# Patient Record
Sex: Female | Born: 1960 | Race: White | Hispanic: No | Smoking: Never smoker
Health system: Southern US, Community
[De-identification: ages and names within clinical notes are randomized; demographics above are authoritative.]

## PROBLEM LIST (undated history)

## (undated) DIAGNOSIS — IMO0002 Reserved for concepts with insufficient information to code with codable children: Secondary | ICD-10-CM

## (undated) DIAGNOSIS — F4310 Post-traumatic stress disorder, unspecified: Secondary | ICD-10-CM

## (undated) DIAGNOSIS — G43909 Migraine, unspecified, not intractable, without status migrainosus: Secondary | ICD-10-CM

## (undated) DIAGNOSIS — K635 Polyp of colon: Secondary | ICD-10-CM

## (undated) DIAGNOSIS — A498 Other bacterial infections of unspecified site: Secondary | ICD-10-CM

## (undated) DIAGNOSIS — Z5189 Encounter for other specified aftercare: Secondary | ICD-10-CM

## (undated) DIAGNOSIS — T7840XA Allergy, unspecified, initial encounter: Secondary | ICD-10-CM

## (undated) DIAGNOSIS — E785 Hyperlipidemia, unspecified: Secondary | ICD-10-CM

## (undated) DIAGNOSIS — M542 Cervicalgia: Secondary | ICD-10-CM

## (undated) DIAGNOSIS — K589 Irritable bowel syndrome without diarrhea: Secondary | ICD-10-CM

## (undated) DIAGNOSIS — H409 Unspecified glaucoma: Secondary | ICD-10-CM

## (undated) DIAGNOSIS — Z8711 Personal history of peptic ulcer disease: Secondary | ICD-10-CM

## (undated) DIAGNOSIS — D649 Anemia, unspecified: Secondary | ICD-10-CM

## (undated) DIAGNOSIS — G8929 Other chronic pain: Secondary | ICD-10-CM

## (undated) HISTORY — DX: Reserved for concepts with insufficient information to code with codable children: IMO0002

## (undated) HISTORY — DX: Post-traumatic stress disorder, unspecified: F43.10

## (undated) HISTORY — DX: Polyp of colon: K63.5

## (undated) HISTORY — DX: Encounter for other specified aftercare: Z51.89

## (undated) HISTORY — PX: OTHER SURGICAL HISTORY: SHX169

## (undated) HISTORY — DX: Anemia, unspecified: D64.9

## (undated) HISTORY — PX: TMJ ARTHROSCOPY: SHX1067

## (undated) HISTORY — DX: Other bacterial infections of unspecified site: A49.8

## (undated) HISTORY — DX: Migraine, unspecified, not intractable, without status migrainosus: G43.909

## (undated) HISTORY — PX: DG GALL BLADDER: HXRAD326

## (undated) HISTORY — DX: Unspecified glaucoma: H40.9

## (undated) HISTORY — DX: Allergy, unspecified, initial encounter: T78.40XA

## (undated) HISTORY — DX: Hyperlipidemia, unspecified: E78.5

## (undated) HISTORY — DX: Personal history of peptic ulcer disease: Z87.11

## (undated) HISTORY — DX: Cervicalgia: M54.2

## (undated) HISTORY — PX: OVARIAN CYST REMOVAL: SHX89

## (undated) HISTORY — DX: Irritable bowel syndrome without diarrhea: K58.9

## (undated) HISTORY — DX: Other chronic pain: G89.29

---

## 1998-03-26 ENCOUNTER — Other Ambulatory Visit: Admission: RE | Admit: 1998-03-26 | Discharge: 1998-03-26 | Payer: Self-pay | Admitting: Gynecology

## 1998-09-15 ENCOUNTER — Other Ambulatory Visit: Admission: RE | Admit: 1998-09-15 | Discharge: 1998-09-15 | Payer: Self-pay | Admitting: Gynecology

## 1998-12-08 ENCOUNTER — Ambulatory Visit (HOSPITAL_COMMUNITY): Admission: RE | Admit: 1998-12-08 | Discharge: 1998-12-08 | Payer: Self-pay | Admitting: Orthopaedic Surgery

## 1998-12-08 ENCOUNTER — Encounter: Payer: Self-pay | Admitting: Orthopaedic Surgery

## 1999-01-07 ENCOUNTER — Ambulatory Visit (HOSPITAL_BASED_OUTPATIENT_CLINIC_OR_DEPARTMENT_OTHER): Admission: RE | Admit: 1999-01-07 | Discharge: 1999-01-08 | Payer: Self-pay | Admitting: Orthopaedic Surgery

## 2002-07-18 ENCOUNTER — Other Ambulatory Visit: Admission: RE | Admit: 2002-07-18 | Discharge: 2002-07-18 | Payer: Self-pay | Admitting: Gynecology

## 2002-08-01 ENCOUNTER — Encounter: Admission: RE | Admit: 2002-08-01 | Discharge: 2002-08-01 | Payer: Self-pay | Admitting: Gynecology

## 2002-08-01 ENCOUNTER — Encounter: Payer: Self-pay | Admitting: Gynecology

## 2003-02-20 ENCOUNTER — Encounter: Admission: RE | Admit: 2003-02-20 | Discharge: 2003-02-20 | Payer: Self-pay | Admitting: Allergy and Immunology

## 2006-03-24 ENCOUNTER — Emergency Department (HOSPITAL_COMMUNITY): Admission: EM | Admit: 2006-03-24 | Discharge: 2006-03-24 | Payer: Self-pay | Admitting: Emergency Medicine

## 2007-05-18 ENCOUNTER — Other Ambulatory Visit: Admission: RE | Admit: 2007-05-18 | Discharge: 2007-05-18 | Payer: Self-pay | Admitting: Obstetrics and Gynecology

## 2007-06-14 ENCOUNTER — Encounter: Admission: RE | Admit: 2007-06-14 | Discharge: 2007-06-14 | Payer: Self-pay | Admitting: Obstetrics and Gynecology

## 2007-06-28 ENCOUNTER — Ambulatory Visit (HOSPITAL_COMMUNITY): Admission: RE | Admit: 2007-06-28 | Discharge: 2007-06-28 | Payer: Self-pay | Admitting: *Deleted

## 2007-06-28 ENCOUNTER — Encounter (INDEPENDENT_AMBULATORY_CARE_PROVIDER_SITE_OTHER): Payer: Self-pay | Admitting: *Deleted

## 2009-04-04 ENCOUNTER — Observation Stay (HOSPITAL_COMMUNITY): Admission: EM | Admit: 2009-04-04 | Discharge: 2009-04-09 | Payer: Self-pay | Admitting: Emergency Medicine

## 2009-04-07 ENCOUNTER — Encounter (INDEPENDENT_AMBULATORY_CARE_PROVIDER_SITE_OTHER): Payer: Self-pay | Admitting: Internal Medicine

## 2009-08-20 ENCOUNTER — Telehealth: Payer: Self-pay | Admitting: Infectious Disease

## 2009-08-26 ENCOUNTER — Ambulatory Visit: Payer: Self-pay | Admitting: Infectious Disease

## 2009-08-26 DIAGNOSIS — E7800 Pure hypercholesterolemia, unspecified: Secondary | ICD-10-CM

## 2009-08-26 DIAGNOSIS — A4901 Methicillin susceptible Staphylococcus aureus infection, unspecified site: Secondary | ICD-10-CM | POA: Insufficient documentation

## 2009-08-26 LAB — CONVERTED CEMR LAB
Calcium: 9.2 mg/dL (ref 8.4–10.5)
Creatinine, Ser: 0.76 mg/dL (ref 0.40–1.20)
Eosinophils Absolute: 0.2 10*3/uL (ref 0.0–0.7)
HCT: 41.8 % (ref 36.0–46.0)
Lymphs Abs: 3.6 10*3/uL (ref 0.7–4.0)
MCHC: 32.1 g/dL (ref 30.0–36.0)
MCV: 94.4 fL (ref 78.0–100.0)
Monocytes Absolute: 0.6 10*3/uL (ref 0.1–1.0)
RBC: 4.43 M/uL (ref 3.87–5.11)
RDW: 13.6 % (ref 11.5–15.5)
Sodium: 142 meq/L (ref 135–145)
WBC: 8.1 10*3/uL (ref 4.0–10.5)

## 2009-09-30 ENCOUNTER — Ambulatory Visit: Payer: Self-pay | Admitting: Infectious Disease

## 2010-01-03 HISTORY — PX: CHOLECYSTECTOMY: SHX55

## 2010-01-24 ENCOUNTER — Encounter: Payer: Self-pay | Admitting: Obstetrics and Gynecology

## 2010-02-04 NOTE — Assessment & Plan Note (Signed)
Summary: NEW PATIENT WITH SKIN RASH PER KVD   Visit Type:  Consult Referring Nura Cahoon:  Dr. Sherald Hess Primary Crystelle Ferrufino:  none  CC:  new patient with rash, staph infection on nose, breast, and and chest.  History of Present Illness: 50 yo noticed swelling on nose and drained pus, went to urgent care [facility], sp I and D and found to have a "staph infection" Was given big green pill made nauseous, then given septra then  then returned in early August then given septra and then developed rash. She has had a lesion show up on her lower abdomen as well. She is PCN allergci--> hives, and now septra allergic. She has hyperlipidemia but no othe rmedical problems. I spent an  thour with this pt including >50% of time with face to face counsellng.  Problems Prior to Update: None  Medications Prior to Update: 1)  None  Current Medications (verified): 1)  Claritin 10 Mg Caps (Loratadine) .Marland Kitchen.. 1 Once Daily 2)  Mupirocin 2 % Oint (Mupirocin) .... Apply Inside Both Nostrils Twice Daily For 7 Days 3)  Hibiclens 4 % Liqd (Chlorhexidine Gluconate) .... Wash Once Daily With Hibiclens For An Hour. Do Not Apply Makeup, Deodorant For 1 Hr After Washing Disp One Bottle  Allergies (verified): 1)  ! Pcn 2)  ! Sulfa 3)  ! Codeine   Preventive Screening-Counseling & Management  Alcohol-Tobacco     Smoking Status: never   Current Allergies (reviewed today): ! PCN ! SULFA ! 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  The patient denies anorexia, fever, weight loss, weight gain, vision loss, decreased hearing, hoarseness, chest pain, syncope, dyspnea on exertion, peripheral edema, prolonged cough, headaches, abdominal pain, melena,  hematochezia, severe indigestion/heartburn, hematuria, incontinence, genital sores, muscle weakness, transient blindness, difficulty walking, depression, unusual weight change, abnormal bleeding, and enlarged lymph nodes.    Vital Signs:  Patient profile:   50 year old female Height:      68 inches (172.72 cm) Weight:      199.75 pounds (90.80 kg) BMI:     30.48 Temp:     98.0 degrees F (36.67 degrees C) oral Pulse rate:   99 / minute BP sitting:   122 / 78  (right arm)  Vitals Entered By: Starleen Arms CMA (August 26, 2009 8:56 AM) CC: new patient with rash, staph infection on nose, breast, and chest Is Patient Diabetic? No Pain Assessment Patient in pain? no      Nutritional Status BMI of > 30 = obese  Does patient need assistance? Functional Status Self care Ambulation Normal   Physical Exam  General:  alert, well-developed, well-nourished, and well-hydrated.   Head:  normocephalic, atraumatic, and no abnormalities observed.   Eyes:  vision grossly intact, pupils equal, pupils round, and pupils reactive to light.   Ears:  no external deformities.  R ear full of wax, left membrane pearly gray Nose:  slight fading erythema on nose, no external deformity and no nasal discharge.   Mouth:  pharynx pink and moist and no erythema.   Neck:  supple and full ROM.   Lungs:  normal respiratory effort, no crackles, and no wheezes.   Heart:  normal rate, regular rhythm, no murmur, and no gallop.  Abdomen:  soft, non-tender, normal bowel sounds, and no distention.   Msk:  normal ROM and no joint swelling.   Extremities:  1+ left pedal edema and 1+ right pedal edema.   Neurologic:  alert & oriented X3, strength normal in all extremities, and gait normal.   Skin:  she has slight faint erythema hwere she had infection over her nose she has healing prior furuncle on lower abdominal wall Psych:  Oriented X3, memory intact for recent and remote, and good eye contact.     Impression &  Recommendations:  Problem # 1:  METHICILLIN SUSCEPTIBLE STAPH INF CCE & UNS SITE (ICD-041.11) Because we do not have records from her urgent care yet I do not know for sur eif this is MSSA or MRSA. Base don what she is saying sounds like MSSA. We will try decontamination regimen f for her with mupirocin and hibiclenz. SHe can see if Mom and sister want to do this in concert. If she has recurrence drainage  is key and if we need antibiotics could consider doxycycline (though wonder if that made her stomach upset) possibly clinda (though dont have sensis) and if it is MSSA and she tolearted keflex or ceph would actually use this. Will check her for HIV for thoroughness sake Orders: T-Basic Metabolic Panel [id number] T-CBC w/Diff [id number]  T-HIV Antibody  (Reflex) [id number]) T-Culture, Wound ([id number])  Problem # 2:  HYPERCHOLESTEROLEMIA (ICD-272.0)  on meds  Orders: Consultation Level V (29528)  Medications Added to Medication List This Visit: 1)  Claritin 10 Mg Caps (Loratadine) .Marland Kitchen.. 1 once daily 2)  Mupirocin 2 % Oint (Mupirocin) .... Apply inside both nostrils twice daily for 7 days 3)  Hibiclens 4 % Liqd (Chlorhexidine gluconate) .... Wash once daily with hibiclens for an hour. do not apply makeup, deodorant for 1 hr after washing disp one bottle  Other Orders: T-Basic Metabolic Panel [id number]) Flu Vaccine 50yrs + [phone]) Admin 1st Vaccine ([id number])  Patient Instructions: 1)  apply mupirocin intranasally and hibiclenz topically 2)  YOu should talk to your Mom and sister about their doing this in synchrony 3)  rtc to see Dr. Daiva Eves in one month Prescriptions: HIBICLENS 4 % LIQD (CHLORHEXIDINE GLUCONATE) wash once daily with hibiclens for an hour. do not apply makeup, deodorant for 1 hr after washing disp one bottle  #1 x 2   Entered and Authorized by:   Acey Lav MD   Signed by:   Paulette Blanch Dam MD on 08/26/2009   Method used:   Electronically to          [facility]  [phone]* (retail)       [address] [place], [place]  [address]       Ph: [phone] or [phone]       Fax: [phone]   RxID:   [id number] MUPIROCIN 2 % OINT (MUPIROCIN) apply inside both nostrils twice daily for 7 days  #30 x 1   Entered and Authorized by:   Acey Lav MD   Signed by:   Acey Lav MD on 08/26/2009   Method used:   Electronically to        [facility]  [phone]* (retail)       [address] [place], [place]  [address]       Ph: [phone] or [phone]       Fax: [phone]   RxID:  [id number]    Immunizations Administered:  Influenza Vaccine # 1:    Vaccine Type: Fluvax 3+    Site: right deltoid    Dose: 0.5 ml    Route: IM    Given by: Starleen Arms CMA    Exp. Date: 03/2010    Lot #: [id number]    VIS given: 07/27/06 version given August 26, 2009.  Flu Vaccine Consent Questions:    Do you have a history of severe allergic reactions to this vaccine? no    Any prior history of allergic reactions to egg and/or gelatin? no    Do you have a sensitivity to the preservative Thimersol? no    Do you have a past history of Guillan-Barre Syndrome? no    Do you currently have an acute febrile illness? no    Have you ever had a severe reaction to latex? no    Vaccine information given and explained to patient? yes    Are you currently pregnant? no

## 2010-02-04 NOTE — Assessment & Plan Note (Signed)
Summary: 49month f/u/per dr Zenaida Niece dam/vs   Visit Type:  Follow-up Referring Provider:  Dr. Sherald Hess Primary Provider:  Juleen China  CC:  f/u.  History of Present Illness: 50 yo noticed swelling on nose and drained pus, went to urgent care [facility], sp I and D and found to have a "staph infection" Was given big green pill made nauseous, then given septra then  then returned in early August then given septra and then developed rash. She has had a lesion show up on her lower abdomen as well. She is PCN allergci--> hives, and now septra allergic. She has hyperlipidemia but no othe rmedical problems. I swabbed her nares when I saw her as consult and did not recover MRSA, MSSA or strep from culture. She did decontamination regimen with hibiclenz and mupirocin and had questionable recurrence of lesion on her right forearm. Her nose still has scar from prior infection. She inquired for primary care MD in [place] area for her to follow when Dr. Larae Grooms   Current Allergies (reviewed today): ! PCN ! SULFA ! 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  noncontributory  Social History: Reviewed history from 08/26/2009 and no changes required. 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    Vital Signs:  Patient profile:   50 year old female Menstrual status:  postmenopausal Height:      68 inches (172.72 cm) Weight:      195 pounds (88.64 kg) BMI:     29.76 Temp:     97 degrees F (36.11 degrees C) oral Pulse rate:   97 / minute BP sitting:   141 / 90  (left arm)  Vitals Entered By: Starleen Arms CMA (September 30, 2009 8:45 AM) CC: f/u Is Patient Diabetic? No Pain Assessment Patient in pain? no      Nutritional Status BMI of 25 - 29 = overweight Nutritional Status Detail nl  Does patient need assistance? Functional Status Self care Ambulation Normal     Menstrual Status postmenopausal   Physical Exam  General:  alert, well-developed, well-nourished, and well-hydrated.   Head:  normocephalic, atraumatic, and no abnormalities observed.   Eyes:  vision grossly intact, pupils equal, pupils round, and pupils reactive to light.   Ears:  no external deformities.  R ear full of wax, left membrane pearly gray Nose:  very faint  erythema on nose, no external deformity and no nasal discharge.   Mouth:  pharynx pink and moist and no erythema.   Lungs:  normal respiratory effort, no crackles, and no wheezes.   Heart:  normal rate, regular rhythm, no murmur, and no gallop.   Abdomen:  soft, non-tender, normal bowel sounds, and no distention.   Msk:  normal ROM and no joint swelling.  Neurologic:  alert & oriented X3, strength normal in all extremities, and gait normal.   Skin:  no new furuncles Psych:  Oriented X3, memory intact for recent and remote, and good eye contact.     Impression & Recommendations:  Problem # 1:  METHICILLIN SUSCEPTIBLE STAPH INF CCE & UNS SITE  (ICD-041.11)  Does not appear to have any significant recurrence  Orders: Est. Patient Level IV (65784)  Problem # 2:  HYPERCHOLESTEROLEMIA (ICD-272.0)  Orders: Est. Patient Level IV (69629)  Patient Instructions: 1)  Rtc as needed to see Dr. Daiva Eves

## 2010-02-04 NOTE — Progress Notes (Signed)
Summary: Rash  Phone Note Call from Patient   Caller: Patient Summary of Call: Pt. called requesting a sooner appt. for rash covering most of her body.  Pt. has appt. with Dr. Algis Liming for 08/26/09.  Offered Tuesday 08/25/09 first available opening. Pt. said needed to be seen sooner. Suggested urgent care or primary care physician. She said she did not have a primary and may go to ER. Initial call taken by: Wendall Mola CMA Duncan Dull),  August 20, 2009 10:12 AM  Follow-up for Phone Call       Follow-up by: Paulette Blanch Dam MD,  August 20, 2009 10:29 AM

## 2010-03-23 ENCOUNTER — Encounter: Payer: Self-pay | Admitting: *Deleted

## 2010-03-24 LAB — COMPREHENSIVE METABOLIC PANEL
ALT: 117 U/L — ABNORMAL HIGH (ref 0–35)
ALT: 76 U/L — ABNORMAL HIGH (ref 0–35)
ALT: 84 U/L — ABNORMAL HIGH (ref 0–35)
AST: 104 U/L — ABNORMAL HIGH (ref 0–37)
AST: 40 U/L — ABNORMAL HIGH (ref 0–37)
AST: 52 U/L — ABNORMAL HIGH (ref 0–37)
AST: 61 U/L — ABNORMAL HIGH (ref 0–37)
Albumin: 2.7 g/dL — ABNORMAL LOW (ref 3.5–5.2)
Albumin: 3.2 g/dL — ABNORMAL LOW (ref 3.5–5.2)
Alkaline Phosphatase: 121 U/L — ABNORMAL HIGH (ref 39–117)
Alkaline Phosphatase: 142 U/L — ABNORMAL HIGH (ref 39–117)
Alkaline Phosphatase: 148 U/L — ABNORMAL HIGH (ref 39–117)
Alkaline Phosphatase: 176 U/L — ABNORMAL HIGH (ref 39–117)
Alkaline Phosphatase: 185 U/L — ABNORMAL HIGH (ref 39–117)
BUN: 7 mg/dL (ref 6–23)
CO2: 24 mEq/L (ref 19–32)
CO2: 26 mEq/L (ref 19–32)
CO2: 29 mEq/L (ref 19–32)
Calcium: 8.3 mg/dL — ABNORMAL LOW (ref 8.4–10.5)
Calcium: 8.6 mg/dL (ref 8.4–10.5)
Calcium: 8.6 mg/dL (ref 8.4–10.5)
Calcium: 8.7 mg/dL (ref 8.4–10.5)
Chloride: 105 mEq/L (ref 96–112)
Chloride: 108 mEq/L (ref 96–112)
Creatinine, Ser: 0.62 mg/dL (ref 0.4–1.2)
Creatinine, Ser: 0.78 mg/dL (ref 0.4–1.2)
GFR calc Af Amer: >60 mL/min (ref >60)
GFR calc Af Amer: >60 mL/min (ref >60)
GFR calc Af Amer: >60 mL/min (ref >60)
GFR calc non Af Amer: >60 mL/min (ref >60)
GFR calc non Af Amer: >60 mL/min (ref >60)
Glucose, Bld: 112 mg/dL — ABNORMAL HIGH (ref 70–99)
Glucose, Bld: 143 mg/dL — ABNORMAL HIGH (ref 70–99)
Glucose, Bld: 92 mg/dL (ref 70–99)
Glucose, Bld: 95 mg/dL (ref 70–99)
Potassium: 3.6 mEq/L (ref 3.5–5.1)
Potassium: 3.7 mEq/L (ref 3.5–5.1)
Sodium: 139 mEq/L (ref 135–145)
Sodium: 140 mEq/L (ref 135–145)
Sodium: 140 mEq/L (ref 135–145)
Total Bilirubin: 0.7 mg/dL (ref 0.3–1.2)
Total Bilirubin: 0.9 mg/dL (ref 0.3–1.2)
Total Protein: 5.6 g/dL — ABNORMAL LOW (ref 6.0–8.3)
Total Protein: 5.8 g/dL — ABNORMAL LOW (ref 6.0–8.3)
Total Protein: 6.3 g/dL (ref 6.0–8.3)
Total Protein: 6.5 g/dL (ref 6.0–8.3)

## 2010-03-24 LAB — GLUCOSE, CAPILLARY
Glucose-Capillary: 104 mg/dL — ABNORMAL HIGH (ref 70–99)
Glucose-Capillary: 110 mg/dL — ABNORMAL HIGH (ref 70–99)
Glucose-Capillary: 116 mg/dL — ABNORMAL HIGH (ref 70–99)
Glucose-Capillary: 161 mg/dL — ABNORMAL HIGH (ref 70–99)
Glucose-Capillary: 162 mg/dL — ABNORMAL HIGH (ref 70–99)
Glucose-Capillary: 84 mg/dL (ref 70–99)

## 2010-03-24 LAB — PHOSPHORUS: Phosphorus: 3 mg/dL (ref 2.3–4.6)

## 2010-03-24 LAB — URINALYSIS, ROUTINE W REFLEX MICROSCOPIC
Glucose, UA: NEGATIVE mg/dL
Ketones, ur: NEGATIVE mg/dL
Urobilinogen, UA: 0.2 mg/dL (ref 0.0–1.0)

## 2010-03-24 LAB — DIFFERENTIAL
Basophils Relative: 1 % (ref 0–1)
Eosinophils Absolute: 0 10*3/uL (ref 0.0–0.7)
Lymphs Abs: 1.1 10*3/uL (ref 0.7–4.0)
Lymphs Abs: 1.9 10*3/uL (ref 0.7–4.0)
Monocytes Absolute: 0.5 10*3/uL (ref 0.1–1.0)
Monocytes Relative: 2 % — ABNORMAL LOW (ref 3–12)
Monocytes Relative: 7 % (ref 3–12)
Neutro Abs: 5.6 10*3/uL (ref 1.7–7.7)
Neutro Abs: 6.3 10*3/uL (ref 1.7–7.7)

## 2010-03-24 LAB — CBC
HCT: 36.4 % (ref 36.0–46.0)
HCT: 40.9 % (ref 36.0–46.0)
Hemoglobin: 11 g/dL — ABNORMAL LOW (ref 12.0–15.0)
Hemoglobin: 11.2 g/dL — ABNORMAL LOW (ref 12.0–15.0)
Hemoglobin: 12.9 g/dL (ref 12.0–15.0)
Hemoglobin: 12.9 g/dL (ref 12.0–15.0)
Hemoglobin: 14.2 g/dL (ref 12.0–15.0)
MCHC: 34.7 g/dL (ref 30.0–36.0)
MCHC: 34.8 g/dL (ref 30.0–36.0)
MCHC: 35.4 g/dL (ref 30.0–36.0)
MCV: 89.6 fL (ref 78.0–100.0)
MCV: 90 fL (ref 78.0–100.0)
MCV: 90.1 fL (ref 78.0–100.0)
Platelets: 161 10*3/uL (ref 150–400)
Platelets: 171 10*3/uL (ref 150–400)
Platelets: 192 10*3/uL (ref 150–400)
RBC: 3.54 MIL/uL — ABNORMAL LOW (ref 3.87–5.11)
RBC: 3.81 MIL/uL — ABNORMAL LOW (ref 3.87–5.11)
RBC: 4.12 MIL/uL (ref 3.87–5.11)
RBC: 4.53 MIL/uL (ref 3.87–5.11)
RDW: 12.3 % (ref 11.5–15.5)
RDW: 12.6 % (ref 11.5–15.5)
RDW: 12.7 % (ref 11.5–15.5)
WBC: 8.2 10*3/uL (ref 4.0–10.5)

## 2010-03-24 LAB — CARDIAC PANEL(CRET KIN+CKTOT+MB+TROPI)
CK, MB: 0.5 ng/mL (ref 0.3–4.0)
CK, MB: 0.6 ng/mL (ref 0.3–4.0)
Relative Index: INVALID (ref 0.0–2.5)
Total CK: 76 U/L (ref 7–177)

## 2010-03-24 LAB — POCT CARDIAC MARKERS
CKMB, poc: <1 ng/mL — ABNORMAL LOW (ref 1.0–8.0)
Myoglobin, poc: 36 ng/mL (ref 12–200)
Troponin i, poc: <0.05 ng/mL (ref 0.00–0.09)

## 2010-03-24 LAB — RAPID URINE DRUG SCREEN, HOSP PERFORMED
Amphetamines: NOT DETECTED
Tetrahydrocannabinol: NOT DETECTED

## 2010-03-24 LAB — HEPATIC FUNCTION PANEL
AST: 77 U/L — ABNORMAL HIGH (ref 0–37)
Albumin: 3.1 g/dL — ABNORMAL LOW (ref 3.5–5.2)
Total Bilirubin: 0.8 mg/dL (ref 0.3–1.2)

## 2010-03-24 LAB — T4, FREE: Free T4: 1.05 ng/dL (ref 0.80–1.80)

## 2010-03-24 LAB — LIPID PANEL
Cholesterol: 264 mg/dL — ABNORMAL HIGH (ref 0–200)
HDL: 48 mg/dL (ref >39)
Total CHOL/HDL Ratio: 5.5 RATIO
Triglycerides: 129 mg/dL (ref <150)

## 2010-03-24 LAB — TSH: TSH: 0.349 u[IU]/mL — ABNORMAL LOW (ref 0.350–4.500)

## 2010-03-24 LAB — URINE MICROSCOPIC-ADD ON

## 2010-03-24 LAB — HEMOGLOBIN A1C
Hgb A1c MFr Bld: 5.5 % (ref 4.6–6.1)
Mean Plasma Glucose: 111 mg/dL

## 2010-03-24 LAB — URINE CULTURE
Colony Count: NO GROWTH
Culture: NO GROWTH

## 2010-03-24 LAB — CK TOTAL AND CKMB (NOT AT ARMC)
CK, MB: 0.7 ng/mL (ref 0.3–4.0)
Relative Index: INVALID (ref 0.0–2.5)
Total CK: 97 U/L (ref 7–177)

## 2010-03-24 LAB — TROPONIN I: Troponin I: <0.01 ng/mL (ref 0.00–0.06)

## 2010-03-24 LAB — LIPASE, BLOOD: Lipase: 23 U/L (ref 11–59)

## 2010-05-18 NOTE — Op Note (Signed)
Grace Taylor, Grace Taylor NO.:  [id number]   MEDICAL RECORD NO.:  [id number]          PATIENT TYPE:  AMB   LOCATION:  ENDO                         FACILITY:  [facility]   PHYSICIAN:  Georgiana Spinner, M.D.    DATE OF BIRTH:  July 29, 1960   DATE OF PROCEDURE:  DATE OF DISCHARGE:                               OPERATIVE REPORT   PROCEDURE:  Colonoscopy.   INDICATIONS:  Rectal bleeding.   ANESTHESIA:  Fentanyl 25 mcg, Versed 2.5 mg.   PROCEDURE:  With the patient mildly sedated in the left lateral  decubitus position the Pentax videoscopic colonoscope was inserted in  the rectum and passed under direct vision with pressure applied to reach  the cecum identified by base of cecum and ileocecal valve both of which  were photographed.  From this point the colonoscope was slowly withdrawn  taking circumferential views of colonic mucosa, stopping in the rectum  which appeared normal on direct and showed hemorrhoids on retroflexed  view  The endoscope was straightened and withdrawn.  The patient's vital  signs, pulse oximeter remained stable.  The patient tolerated procedure  well without apparent complications.   FINDINGS:  Internal hemorrhoids otherwise unremarkable exam.   PLAN:  Consider repeat examination 5-10 years if indicated.           ______________________________  Georgiana Spinner, M.D.     GMO/MEDQ  D:  06/28/2007  T:  06/28/2007  Job:  [id number]

## 2010-05-18 NOTE — Op Note (Signed)
NAMEPERSIA, LINTNER NO.:  [id number]   MEDICAL RECORD NO.:  [id number]          PATIENT TYPE:  AMB   LOCATION:  ENDO                         FACILITY:  [facility]   PHYSICIAN:  Georgiana Spinner, M.D.    DATE OF BIRTH:  22-Oct-1960   DATE OF PROCEDURE:  06/28/2007  DATE OF DISCHARGE:                               OPERATIVE REPORT   PROCEDURE:  Upper endoscopy.   INDICATIONS:  Hemoccult positivity.   ANESTHESIA:  Fentanyl 75 mcg, Versed 7.5 mg.   PROCEDURE:  With the patient mildly sedated in the left lateral  decubitus position, the Pentax videoscopic endoscope was inserted in the  mouth, passed under direct vision through the esophagus which appeared  normal until we reached distal esophagus and it would not open fully but  there is a questionable tongue of Barrett's which was photographed and  biopsy.  We entered into the stomach.  Fundus, body, antrum, duodenal  bulb, second portion of duodenum appeared normal.  From this point, the  endoscope was slowly withdrawn taking circumferential views of duodenal  mucosa until the endoscope had been pulled back into stomach, placed in  retroflexion to view the stomach from below.  The endoscope was  straightened and withdrawn taking circumferential views of the remaining  gastric and esophageal mucosa.  The patient's vital signs and pulse  oximeter remained stable.  The patient tolerated procedure well without  apparent complications.   FINDINGS:  Question of Barrett's esophagus, otherwise unremarkable exam.  Await biopsy report.  The patient will call me for results and follow-up  with me as an outpatient.  Proceed to colonoscopy.           ______________________________  Georgiana Spinner, M.D.     GMO/MEDQ  D:  06/28/2007  T:  06/28/2007  Job:  [id number]

## 2010-05-21 NOTE — Op Note (Signed)
[place]  Patient:    Grace Taylor                 MRN: [id number] Proc. Date: 01/07/99 Adm. Date:  [id number] Attending:  Marcene Corning                           Operative Report  PREOPERATIVE DIAGNOSIS: 1. Right shoulder impingement. 2. Right shoulder rotator cuff tear.  POSTOPERATIVE DIAGNOSIS: 1. Right shoulder impingement. 2. Right shoulder rotator cuff tear.  PROCEDURE: 1. Right shoulder arthroscopic acromioplasty. 2. Right shoulder open rotator cuff repair.  ANESTHESIA:  General.  ATTENDING SURGEON:  Lubertha Basque. Jerl Santos, M.D.  ASSISTANT:  Buren Kos, P.A.  INDICATIONS FOR PROCEDURE:  The patient is a woman with several months of right  dominant shoulder pain.  This started about six or seven months ago and she has  received transient relief after subacromial injections.  She eventually underwent a MRI scan, which showed a complete rotator cuff tear along with subacromial impingement.  The plan is to proceed at this point with arthroscopy with possible open versus arthroscopic rotator cuff repair.  The procedures were discussed with the patient.  An informed operative consent was obtained after discussion of the possible complications of: reaction to anesthesia and infection.  DESCRIPTION OF PROCEDURE:  The patient was taken to the operating suite, where general anesthetic was applied without difficulty.  The patient was placed in the beach chair position; prepped and draped in the normal sterile fashion.  After he administration of preoperative IV antibiotics, an arthroscopy of the right shoulder was performed through two portals.  Glenohumeral joint showed no degenerative change.  Biceps tendon was well anchored.  The labrale structures, LAD, RAL were all intact.  The rotator cuff was torn when inspected from below.  When inspected from above she also had a rotator cuff tear, which was complete.  The  tear was about 1.5 cm in length, but had a longitudinal split as well.  This was felt difficult to repair through the scope.  An arthroscopic acromioplasty was performed to flatten the acromion, followed by extension of her lateral portal into an incision.  The deltoid was split and the rotator cuff examined.  A portion of the greater tuberosity was removed.  A bur was used to freshen a bleeding bed of bone in the area of her intended repair.  A Bios corkscrew anchor was placed with two #2 ethibonds emanating.  These were then passed through the cuff, and the cuff was sewn down on this surface of bleeding  bone without difficulty.  No great tension was placed on the repair with the arm at the side.  The wound was irrigated, followed by closure of the deltoid fascia with 2.0 undyed Vicryl.  Subcutaneous tissue was reapproximated with a similar stitch, followed by subcuticular closure and Steri-Strips.  The wound was injected with  Marcaine, followed by Adaptic and a dry gauze dressing with tape.  ESTIMATED BLOOD LOSS/INTRAOPERATIVE FLUIDS:  Obtained from the anesthesia records.  DISPOSITION:  The patient was extubated in the operating room, taken to the recovery room in stable condition.  PLANS:  Patient is to stay overnight for pain control, with probable discharge n the morning. DD:  01/07/99 TD:  01/08/99 Job: [id number]

## 2011-02-01 ENCOUNTER — Emergency Department (HOSPITAL_COMMUNITY)
Admission: EM | Admit: 2011-02-01 | Discharge: 2011-02-01 | Disposition: A | Discharge status: Home / Self Care | Payer: No Typology Code available for payment source | Attending: Emergency Medicine | Admitting: Emergency Medicine

## 2011-02-01 ENCOUNTER — Emergency Department (HOSPITAL_COMMUNITY): Payer: No Typology Code available for payment source

## 2011-02-01 ENCOUNTER — Encounter (HOSPITAL_COMMUNITY): Payer: Self-pay | Admitting: *Deleted

## 2011-02-01 DIAGNOSIS — T1490XA Injury, unspecified, initial encounter: Secondary | ICD-10-CM | POA: Insufficient documentation

## 2011-02-01 DIAGNOSIS — M542 Cervicalgia: Secondary | ICD-10-CM | POA: Insufficient documentation

## 2011-02-01 DIAGNOSIS — M25569 Pain in unspecified knee: Secondary | ICD-10-CM | POA: Insufficient documentation

## 2011-02-01 DIAGNOSIS — M62838 Other muscle spasm: Secondary | ICD-10-CM | POA: Insufficient documentation

## 2011-02-01 MED ORDER — CYCLOBENZAPRINE HCL 10 MG PO TABS: 5.0000 mg | ORAL_TABLET | Freq: Two times a day (BID) | ORAL | Status: AC | PRN

## 2011-02-01 MED ORDER — IBUPROFEN 800 MG PO TABS: 800.0000 mg | ORAL_TABLET | Freq: Three times a day (TID) | ORAL | Status: DC

## 2011-02-01 NOTE — ED Notes (Signed)
Patient transported to X-ray 
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
## 2011-02-01 NOTE — ED Notes (Signed)
Pt was explain d/c papers. Pt denies having any questions. Pt states she may go and see another md

## 2011-02-02 NOTE — ED Provider Notes (Signed)
Medical screening examination/treatment/procedure(s) were performed by non-physician practitioner and as supervising physician I was immediately available for consultation/collaboration.  Donnetta Hutching, MD 02/02/11 1610

## 2012-01-04 HISTORY — PX: COLONOSCOPY: SHX174

## 2012-01-04 HISTORY — PX: ESOPHAGOGASTRODUODENOSCOPY: SHX1529

## 2013-01-15 ENCOUNTER — Emergency Department (HOSPITAL_BASED_OUTPATIENT_CLINIC_OR_DEPARTMENT_OTHER)
Admission: EM | Admit: 2013-01-15 | Discharge: 2013-01-15 | Disposition: A | Discharge status: Home / Self Care | Attending: Emergency Medicine | Admitting: Emergency Medicine

## 2013-01-15 ENCOUNTER — Emergency Department (HOSPITAL_BASED_OUTPATIENT_CLINIC_OR_DEPARTMENT_OTHER)

## 2013-01-15 ENCOUNTER — Encounter (HOSPITAL_BASED_OUTPATIENT_CLINIC_OR_DEPARTMENT_OTHER): Payer: Self-pay | Admitting: Emergency Medicine

## 2013-01-15 DIAGNOSIS — S4980XA Other specified injuries of shoulder and upper arm, unspecified arm, initial encounter: Secondary | ICD-10-CM | POA: Diagnosis present

## 2013-01-15 DIAGNOSIS — Y9389 Activity, other specified: Secondary | ICD-10-CM | POA: Insufficient documentation

## 2013-01-15 DIAGNOSIS — IMO0002 Reserved for concepts with insufficient information to code with codable children: Secondary | ICD-10-CM | POA: Diagnosis not present

## 2013-01-15 DIAGNOSIS — S5000XA Contusion of unspecified elbow, initial encounter: Secondary | ICD-10-CM | POA: Insufficient documentation

## 2013-01-15 DIAGNOSIS — S7001XA Contusion of right hip, initial encounter: Secondary | ICD-10-CM

## 2013-01-15 DIAGNOSIS — Z880 Allergy status to penicillin: Secondary | ICD-10-CM | POA: Insufficient documentation

## 2013-01-15 DIAGNOSIS — S5010XA Contusion of unspecified forearm, initial encounter: Secondary | ICD-10-CM | POA: Diagnosis not present

## 2013-01-15 DIAGNOSIS — S7000XA Contusion of unspecified hip, initial encounter: Secondary | ICD-10-CM | POA: Insufficient documentation

## 2013-01-15 DIAGNOSIS — Z79899 Other long term (current) drug therapy: Secondary | ICD-10-CM | POA: Diagnosis not present

## 2013-01-15 DIAGNOSIS — S46909A Unspecified injury of unspecified muscle, fascia and tendon at shoulder and upper arm level, unspecified arm, initial encounter: Secondary | ICD-10-CM | POA: Diagnosis present

## 2013-01-15 DIAGNOSIS — S5011XA Contusion of right forearm, initial encounter: Secondary | ICD-10-CM

## 2013-01-15 DIAGNOSIS — Y9269 Other specified industrial and construction area as the place of occurrence of the external cause: Secondary | ICD-10-CM | POA: Diagnosis not present

## 2013-01-15 MED ORDER — TRAMADOL HCL 50 MG PO TABS: 50.0000 mg | ORAL_TABLET | Freq: Four times a day (QID) | ORAL | Status: DC | PRN

## 2013-01-15 MED ORDER — IBUPROFEN 800 MG PO TABS: 800.0000 mg | ORAL_TABLET | Freq: Three times a day (TID) | ORAL | Status: DC

## 2013-01-15 NOTE — ED Provider Notes (Signed)
Medical screening examination/treatment/procedure(s) were performed by non-physician practitioner and as supervising physician I was immediately available for consultation/collaboration.    Kathalene Frames, MD 01/15/13 [phone]

## 2013-01-15 NOTE — ED Notes (Signed)
Patient transported to X-ray 
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
## 2013-01-15 NOTE — Discharge Instructions (Signed)

## 2013-01-17 ENCOUNTER — Ambulatory Visit (INDEPENDENT_AMBULATORY_CARE_PROVIDER_SITE_OTHER): Admitting: Sports Medicine

## 2013-01-17 ENCOUNTER — Encounter: Payer: Self-pay | Admitting: Sports Medicine

## 2013-01-17 ENCOUNTER — Ambulatory Visit (INDEPENDENT_AMBULATORY_CARE_PROVIDER_SITE_OTHER)

## 2013-01-17 VITALS — BP 154/84 | HR 95 | Ht 67.0 in | Wt 220.0 lb

## 2013-01-17 DIAGNOSIS — S7000XA Contusion of unspecified hip, initial encounter: Secondary | ICD-10-CM

## 2013-01-17 DIAGNOSIS — S7001XA Contusion of right hip, initial encounter: Secondary | ICD-10-CM

## 2013-01-17 DIAGNOSIS — R079 Chest pain, unspecified: Secondary | ICD-10-CM

## 2013-01-17 DIAGNOSIS — R0781 Pleurodynia: Secondary | ICD-10-CM

## 2013-01-17 DIAGNOSIS — S40011A Contusion of right shoulder, initial encounter: Secondary | ICD-10-CM | POA: Insufficient documentation

## 2013-01-17 DIAGNOSIS — S40019A Contusion of unspecified shoulder, initial encounter: Secondary | ICD-10-CM

## 2013-01-17 DIAGNOSIS — R0789 Other chest pain: Secondary | ICD-10-CM | POA: Insufficient documentation

## 2013-01-17 NOTE — Assessment & Plan Note (Signed)
Good function. X-rays to complete the workup. Continue ibuprofen and occasional tramadol. No use of right arm for 2 weeks, return to see me in 2 weeks for full clearance.
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
## 2013-01-17 NOTE — Assessment & Plan Note (Signed)
Pain is localized over the ribs at the mid axillary line. X-rays. Ibuprofen as needed. Return in 2 weeks.

## 2013-01-17 NOTE — Assessment & Plan Note (Signed)
Pain over the right posterior hip as well as greater trochanter. X-rays to complete the workup. Continue analgesics as needed. Return to see me in 2 weeks, I will likely clear her afterwards.

## 2013-01-28 ENCOUNTER — Telehealth: Payer: Self-pay

## 2013-01-28 NOTE — Telephone Encounter (Signed)
Error

## 2013-01-28 NOTE — Telephone Encounter (Signed)
Patient called stated that we sent her back to work and she needed  pain medication, she was told that she would have to schedule an appt for 01/30/2013 to be released to go back to work. Gerardo Caiazzo,CMA

## 2013-01-31 ENCOUNTER — Ambulatory Visit (INDEPENDENT_AMBULATORY_CARE_PROVIDER_SITE_OTHER): Admitting: Sports Medicine

## 2013-01-31 ENCOUNTER — Encounter: Payer: Self-pay | Admitting: Sports Medicine

## 2013-01-31 VITALS — BP 188/104 | HR 88 | Wt 219.0 lb

## 2013-01-31 DIAGNOSIS — R0789 Other chest pain: Secondary | ICD-10-CM

## 2013-01-31 DIAGNOSIS — S40011A Contusion of right shoulder, initial encounter: Secondary | ICD-10-CM

## 2013-01-31 DIAGNOSIS — R0781 Pleurodynia: Secondary | ICD-10-CM

## 2013-01-31 DIAGNOSIS — S40019A Contusion of unspecified shoulder, initial encounter: Secondary | ICD-10-CM

## 2013-01-31 DIAGNOSIS — R079 Chest pain, unspecified: Secondary | ICD-10-CM

## 2013-01-31 DIAGNOSIS — M5412 Radiculopathy, cervical region: Secondary | ICD-10-CM

## 2013-01-31 DIAGNOSIS — S7000XA Contusion of unspecified hip, initial encounter: Secondary | ICD-10-CM

## 2013-01-31 DIAGNOSIS — S7001XA Contusion of right hip, initial encounter: Secondary | ICD-10-CM

## 2013-01-31 MED ORDER — TRAMADOL HCL 50 MG PO TABS
50.0000 mg | ORAL_TABLET | Freq: Four times a day (QID) | ORAL | Status: DC | PRN
Start: 2013-01-31 — End: 2015-09-10

## 2013-01-31 MED ORDER — IBUPROFEN 800 MG PO TABS: 800.0000 mg | ORAL_TABLET | Freq: Three times a day (TID) | ORAL | Status: DC

## 2013-01-31 NOTE — Assessment & Plan Note (Signed)
X-rays have been negative. This has essentially resolved. 

## 2013-01-31 NOTE — Assessment & Plan Note (Signed)
X-rays have been negative. This has essentially resolved.

## 2013-01-31 NOTE — Assessment & Plan Note (Addendum)
This is a new complaint and is not related to her work injury. She has right-sided periscapular burning, this is likely related to cervical degenerative disc disease, which is unrelated to her work-related injury. She does desire referral to physical therapy and refill of medications which I think is appropriate. I would be happy to provide her with a second opinion from a different orthopedist.  Unfortunately I do think that this patient has developed some malingering.

## 2013-01-31 NOTE — Progress Notes (Addendum)
  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 She describes it as a burning type sensation. Moderate, persistent.  Rib pain on the right : resolved.  Right hip contusion: Resolved.  Past medical history, Surgical history, Family history not pertinant except as noted below, Social history, Allergies, and medications have been entered into the medical record, reviewed, and no changes needed.   Review of Systems: No fevers, chills, night sweats, weight loss, chest pain, or shortness of breath.   Objective:    General: Well Developed, well nourished, and in no acute distress.  Neuro: Alert and oriented x3, extra-ocular muscles intact, sensation grossly intact.  HEENT: Normocephalic, atraumatic, pupils equal round reactive to light, neck supple, no masses, no lymphadenopathy, thyroid nonpalpable.  Skin: Warm and dry, no rashes. Cardiac: Regular rate and rhythm, no murmurs rubs or gallops, no lower extremity edema.  Respiratory: Clear to auscultation bilaterally. Not using accessory muscles, speaking in full sentences. Right Shoulder: Inspection reveals no abnormalities, atrophy or asymmetry. Palpation is normal with no tenderness over AC joint or bicipital groove. ROM is full in all planes. Rotator cuff strength normal throughout. No signs of impingement with negative Neer and Hawkin's tests, empty can sign. Speeds and Yergason's tests normal. No labral pathology noted with negative Obrien's, negative clunk and good stability. Normal scapular function observed. No painful arc and no drop arm sign. No apprehension sign Minimal tenderness to palpation of the rhomboids  Impression and Recommendations:     I spent 40 minutes with this patient, greater than 50% was face-to-face time counseling regarding the above diagnoses.

## 2013-02-06 ENCOUNTER — Telehealth: Payer: Self-pay | Admitting: *Deleted

## 2013-02-06 MED ORDER — MELOXICAM 15 MG PO TABS
ORAL_TABLET | ORAL | Status: DC
Start: 2013-02-06 — End: 2015-09-30

## 2013-02-06 NOTE — Telephone Encounter (Signed)
Pt called and left a message on my vm that  She didn't "feel good" Saturday and Sunday and she needs a note for work.( I did review last progress note and per note Dr. Darene Lamer feels pt may be trying to get out of working) I told her that we will not write a note excusing you from work simply because you don't feel well. Pt then states she felt light headed while taking the tramdol and she couldn't drive to work and Ibuprofen upsets her stomach. I told the patient I would send the message to Dr. Darene Lamer and he can determine if another medication is warranted given that pt states she is dizzy from it and  I will make him aware that the pt wanted a note for work someone will call her back. Pt voiced understanding.

## 2013-02-06 NOTE — Telephone Encounter (Signed)
Message left on vm 

## 2013-02-06 NOTE — Telephone Encounter (Signed)
Calling in meloxicam which is easy on the stomach.  No work notes.

## 2013-02-07 ENCOUNTER — Encounter (HOSPITAL_BASED_OUTPATIENT_CLINIC_OR_DEPARTMENT_OTHER): Payer: Self-pay | Admitting: Emergency Medicine

## 2013-02-07 ENCOUNTER — Emergency Department (HOSPITAL_BASED_OUTPATIENT_CLINIC_OR_DEPARTMENT_OTHER)
Admission: EM | Admit: 2013-02-07 | Discharge: 2013-02-07 | Disposition: A | Discharge status: Home / Self Care | Attending: Emergency Medicine | Admitting: Emergency Medicine

## 2013-02-07 DIAGNOSIS — Y9389 Activity, other specified: Secondary | ICD-10-CM | POA: Diagnosis not present

## 2013-02-07 DIAGNOSIS — Y990 Civilian activity done for income or pay: Secondary | ICD-10-CM | POA: Diagnosis not present

## 2013-02-07 DIAGNOSIS — Z880 Allergy status to penicillin: Secondary | ICD-10-CM | POA: Insufficient documentation

## 2013-02-07 DIAGNOSIS — R51 Headache: Secondary | ICD-10-CM | POA: Diagnosis present

## 2013-02-07 DIAGNOSIS — S161XXA Strain of muscle, fascia and tendon at neck level, initial encounter: Secondary | ICD-10-CM

## 2013-02-07 DIAGNOSIS — Z79899 Other long term (current) drug therapy: Secondary | ICD-10-CM | POA: Insufficient documentation

## 2013-02-07 DIAGNOSIS — Y9289 Other specified places as the place of occurrence of the external cause: Secondary | ICD-10-CM | POA: Insufficient documentation

## 2013-02-07 DIAGNOSIS — G44209 Tension-type headache, unspecified, not intractable: Secondary | ICD-10-CM | POA: Diagnosis not present

## 2013-02-07 DIAGNOSIS — R1110 Vomiting, unspecified: Secondary | ICD-10-CM | POA: Insufficient documentation

## 2013-02-07 DIAGNOSIS — S139XXA Sprain of joints and ligaments of unspecified parts of neck, initial encounter: Secondary | ICD-10-CM | POA: Diagnosis not present

## 2013-02-07 DIAGNOSIS — W240XXA Contact with lifting devices, not elsewhere classified, initial encounter: Secondary | ICD-10-CM | POA: Diagnosis not present

## 2013-02-07 MED ORDER — METOCLOPRAMIDE HCL 5 MG/ML IJ SOLN
10.0000 mg | Freq: Once | INTRAMUSCULAR | Status: AC
  Administered 2013-02-07: 10 mg via INTRAVENOUS
  Filled 2013-02-07: qty 2

## 2013-02-07 MED ORDER — SODIUM CHLORIDE 0.9 % IV BOLUS (SEPSIS)
1000.0000 mL | Freq: Once | INTRAVENOUS | Status: AC
  Administered 2013-02-07: 1000 mL via INTRAVENOUS

## 2013-02-07 MED ORDER — ONDANSETRON HCL 4 MG PO TABS: 4.0000 mg | ORAL_TABLET | Freq: Three times a day (TID) | ORAL | Status: DC | PRN

## 2013-02-07 MED ORDER — KETOROLAC TROMETHAMINE 30 MG/ML IJ SOLN
30.0000 mg | Freq: Once | INTRAMUSCULAR | Status: AC
  Administered 2013-02-07: 30 mg via INTRAVENOUS
  Filled 2013-02-07: qty 1

## 2013-02-07 NOTE — Discharge Instructions (Signed)
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
## 2013-02-08 ENCOUNTER — Ambulatory Visit: Payer: Self-pay | Admitting: Family Medicine

## 2013-02-12 ENCOUNTER — Ambulatory Visit: Payer: Self-pay | Admitting: Family Medicine

## 2013-03-18 ENCOUNTER — Ambulatory Visit: Payer: Self-pay | Admitting: Internal Medicine

## 2013-03-28 ENCOUNTER — Other Ambulatory Visit: Payer: Self-pay | Admitting: Nurse Practitioner

## 2013-03-28 DIAGNOSIS — Z1231 Encounter for screening mammogram for malignant neoplasm of breast: Secondary | ICD-10-CM

## 2013-04-10 ENCOUNTER — Ambulatory Visit: Payer: Self-pay

## 2013-04-10 ENCOUNTER — Ambulatory Visit
Admission: RE | Admit: 2013-04-10 | Discharge: 2013-04-10 | Disposition: A | Discharge status: Home / Self Care | Payer: 59 | Source: Ambulatory Visit | Attending: Nurse Practitioner | Admitting: Nurse Practitioner

## 2013-04-10 DIAGNOSIS — Z1231 Encounter for screening mammogram for malignant neoplasm of breast: Secondary | ICD-10-CM

## 2014-03-04 HISTORY — PX: FLEXIBLE SIGMOIDOSCOPY: SHX1649

## 2014-03-13 ENCOUNTER — Other Ambulatory Visit: Payer: Self-pay

## 2014-03-13 DIAGNOSIS — Z1231 Encounter for screening mammogram for malignant neoplasm of breast: Secondary | ICD-10-CM

## 2014-03-18 ENCOUNTER — Other Ambulatory Visit: Payer: Self-pay | Admitting: Gastroenterology

## 2014-03-31 ENCOUNTER — Other Ambulatory Visit: Payer: Self-pay | Admitting: Gastroenterology

## 2014-03-31 DIAGNOSIS — R109 Unspecified abdominal pain: Secondary | ICD-10-CM

## 2014-04-02 ENCOUNTER — Ambulatory Visit (HOSPITAL_COMMUNITY)
Admission: RE | Admit: 2014-04-02 | Discharge: 2014-04-02 | Disposition: A | Discharge status: Home / Self Care | Payer: Federal, State, Local not specified - PPO | Source: Ambulatory Visit | Attending: Gastroenterology | Admitting: Gastroenterology

## 2014-04-02 ENCOUNTER — Ambulatory Visit
Admission: RE | Admit: 2014-04-02 | Discharge: 2014-04-02 | Disposition: A | Discharge status: Home / Self Care | Payer: Self-pay | Source: Ambulatory Visit | Attending: Gastroenterology | Admitting: Gastroenterology

## 2014-04-02 ENCOUNTER — Other Ambulatory Visit: Payer: Self-pay | Admitting: Gastroenterology

## 2014-04-02 DIAGNOSIS — R109 Unspecified abdominal pain: Secondary | ICD-10-CM

## 2014-04-02 DIAGNOSIS — A047 Enterocolitis due to Clostridium difficile: Secondary | ICD-10-CM | POA: Diagnosis not present

## 2014-04-02 MED ORDER — IOHEXOL 300 MG/ML  SOLN
100.0000 mL | Freq: Once | INTRAMUSCULAR | Status: AC | PRN
  Administered 2014-04-02: 100 mL via INTRAVENOUS

## 2014-04-16 ENCOUNTER — Ambulatory Visit
Admission: RE | Admit: 2014-04-16 | Discharge: 2014-04-16 | Disposition: A | Discharge status: Home / Self Care | Payer: Federal, State, Local not specified - PPO | Source: Ambulatory Visit

## 2014-04-16 DIAGNOSIS — Z1231 Encounter for screening mammogram for malignant neoplasm of breast: Secondary | ICD-10-CM

## 2014-05-07 ENCOUNTER — Other Ambulatory Visit (HOSPITAL_COMMUNITY)
Admission: RE | Admit: 2014-05-07 | Discharge: 2014-05-07 | Disposition: A | Discharge status: Home / Self Care | Payer: Federal, State, Local not specified - PPO | Source: Ambulatory Visit | Attending: Internal Medicine | Admitting: Internal Medicine

## 2014-05-07 ENCOUNTER — Other Ambulatory Visit (HOSPITAL_COMMUNITY): Payer: Self-pay | Admitting: Internal Medicine

## 2014-05-07 DIAGNOSIS — Z01419 Encounter for gynecological examination (general) (routine) without abnormal findings: Secondary | ICD-10-CM | POA: Diagnosis not present

## 2014-05-07 DIAGNOSIS — Z1151 Encounter for screening for human papillomavirus (HPV): Secondary | ICD-10-CM | POA: Diagnosis present

## 2014-05-09 LAB — CYTOLOGY - PAP

## 2015-04-29 ENCOUNTER — Other Ambulatory Visit: Payer: Self-pay

## 2015-04-29 DIAGNOSIS — Z1231 Encounter for screening mammogram for malignant neoplasm of breast: Secondary | ICD-10-CM

## 2015-05-13 ENCOUNTER — Ambulatory Visit
Admission: RE | Admit: 2015-05-13 | Discharge: 2015-05-13 | Disposition: A | Discharge status: Home / Self Care | Payer: Federal, State, Local not specified - PPO | Source: Ambulatory Visit

## 2015-05-13 DIAGNOSIS — Z1231 Encounter for screening mammogram for malignant neoplasm of breast: Secondary | ICD-10-CM

## 2015-07-16 ENCOUNTER — Other Ambulatory Visit (HOSPITAL_COMMUNITY)
Admission: RE | Admit: 2015-07-16 | Discharge: 2015-07-16 | Disposition: A | Discharge status: Home / Self Care | Payer: Federal, State, Local not specified - PPO | Source: Ambulatory Visit | Attending: Internal Medicine | Admitting: Internal Medicine

## 2015-07-16 ENCOUNTER — Encounter: Payer: Self-pay | Admitting: Internal Medicine

## 2015-07-16 DIAGNOSIS — Z01419 Encounter for gynecological examination (general) (routine) without abnormal findings: Secondary | ICD-10-CM | POA: Insufficient documentation

## 2015-07-17 ENCOUNTER — Other Ambulatory Visit: Payer: Self-pay | Admitting: Internal Medicine

## 2015-07-21 LAB — CYTOLOGY - PAP

## 2015-09-10 ENCOUNTER — Encounter (INDEPENDENT_AMBULATORY_CARE_PROVIDER_SITE_OTHER): Payer: Self-pay

## 2015-09-10 ENCOUNTER — Ambulatory Visit (INDEPENDENT_AMBULATORY_CARE_PROVIDER_SITE_OTHER): Payer: Federal, State, Local not specified - PPO | Admitting: Allergy & Immunology

## 2015-09-10 ENCOUNTER — Encounter: Payer: Self-pay | Admitting: Allergy & Immunology

## 2015-09-10 VITALS — BP 120/76 | HR 88 | Resp 16 | Ht 66.0 in | Wt 216.0 lb

## 2015-09-10 DIAGNOSIS — L259 Unspecified contact dermatitis, unspecified cause: Secondary | ICD-10-CM

## 2015-09-10 DIAGNOSIS — J310 Chronic rhinitis: Secondary | ICD-10-CM | POA: Diagnosis not present

## 2015-09-10 MED ORDER — METHYLPREDNISOLONE ACETATE 40 MG/ML IJ SUSP
40.0000 mg | Freq: Once | INTRAMUSCULAR | Status: AC
  Administered 2015-09-10: 40 mg via INTRAMUSCULAR

## 2015-09-10 NOTE — Progress Notes (Signed)
FOLLOW UP  Date of Service/Encounter:  09/10/15   Assessment:   Dermatitis, contact  Non-allergic rhinitis    Plan/Recommendations:   1. Dermatitis, contact - We gave you a steroid injection today. - Take the prednisone from the packet we provided. - Start cetirizine 20mg  daily to help with the itching.  - Try using Tecnu to clean off the oil from your skin in the future.  - This can remove the oil and prevent it from causing a reaction in the future.   2. Non-allergic rhinitis - Continue Flonase two sprays per nostril daily. - Continue with nasal saline rinses.  3. Return in about 6 months (around 03/09/2016).   Subjective:   Grace Taylor is a 55 y.o. female presenting today for follow up of  Chief Complaint  Patient presents with  . Allergic Reaction  .  Grace Taylor has a history of the following: Patient Active Problem List   Diagnosis Date Noted  . Cervical radiculitis 01/31/2013  . Contusion of shoulder, right 01/17/2013  . Rib pain on right side 01/17/2013  . Contusion of hip, right 01/17/2013    History obtained from: chart review and patient.  Grace Taylor was referred by Grace Cha, MD.     Grace Taylor is a 55 y.o. female presenting for a follow up visit for contact dermatitis. The patient was last seen in December 2015 by Dr. [name]. At that time, she was doing well with Bactroban 3 times a day as well as mometasone 0.1% cream. She did have environmental allergy testing performed via specific IgE testing which was negative. The blood testing was performed in January 2009. She had previous skin testing from April 1997 that was positive to molds as well as dust mite and grasses.  Since that time, the patient has done well. She presents today with concern of poison ivy. 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 Within 24 hours, she started breaking out all over her  body. She is also having involvement around her eyes. She has had similar reactions in the past. An every other pass breakouts, she has required steroid injection.  Grace Taylor does have a non-allergic rhinitis and uses fluticasone two sprays per nostril. She also uses nasal saline. Grace Taylor does not have asthma. There've been no major changes to her medical history since last visit.  Otherwise, there have been no changes to the past medical history, surgical history, family history, or social history.     Review of Systems: a 14-point review of systems is pertinent for what is mentioned in HPI.  Otherwise, all other systems were negative. Constitutional: negative other than that listed in the HPI Eyes: negative other than that listed in the HPI Ears, nose, mouth, throat, and face: negative other than that listed in the HPI Respiratory: negative other than that listed in the HPI Cardiovascular: negative other than that listed in the HPI Gastrointestinal: negative other than that listed in the HPI Genitourinary: negative other than that listed in the HPI Integument: negative other than that listed in the HPI Hematologic: negative other than that listed in the HPI Musculoskeletal: negative other than that listed in the HPI Neurological: negative other than that listed in the HPI Allergy/Immunologic: negative other than that listed in the HPI    Objective:   Blood pressure 120/76, pulse 88, resp. rate 16, height 5\' 6"  (1.676 m), weight 216 lb (98 kg). Body mass index is 34.86  kg/m.   Physical Exam:  General: Alert, interactive, in no acute distress.Very pleasant female.  HEENT: TMs pearly gray, turbinates minimally edematous without discharge, post-pharynx mildly erythematous. Neck: Supple without thyromegaly. Adenopathy: no enlarged lymph nodes appreciated in the anterior cervical, occipital, axillary, epitrochlear, inguinal, or popliteal regions Lungs: Clear to auscultation without  wheezing, rhonchi or rales. No increased work of breathing. CV: Normal S1, S2 without murmurs. Capillary refill <2 seconds.  Skin: multiple raised lesions with some vesicles over her body, including her arms, legs, and right cheek. Extremities:  No clubbing, cyanosis or edema. Neuro:   Grossly intact.   Diagnostic studies: None  Grace Marvel, MD [facility]

## 2015-09-10 NOTE — Patient Instructions (Signed)
1. Dermatitis, contact - We gave you a steroid injection today. - Take the prednisone from the packet we provided. - Start cetirizine 20mg  daily to help with the itching.  - Try using Tecnu to clean off the oil from your skin in the future.     2. Non-allergic rhinitis - Continue Flonase two sprays per nostril daily. - Continue with nasal saline rinses.  3. Return in about 6 months (around 03/09/2016).  Please inform us of any Emergency Department visits, hospitalizations, or changes in symptoms. Call us before going to the ED for breathing or allergy symptoms since we might be able to fit you in for a sick visit. Feel free to contact us anytime with any questions, problems, or concerns.  It was a pleasure to meet you today!

## 2015-09-11 ENCOUNTER — Telehealth: Payer: Self-pay | Admitting: Allergy & Immunology

## 2015-09-11 ENCOUNTER — Other Ambulatory Visit: Payer: Self-pay

## 2015-09-11 MED ORDER — PREDNISONE 20 MG PO TABS: 60.0000 mg | ORAL_TABLET | Freq: Every day | ORAL | 0 refills | Status: DC

## 2015-09-11 NOTE — Telephone Encounter (Signed)
I reviewed the telephone notes. Please send in prednisone 60mg  daily for seven days. There should be no reason for a three week course of prednisone for poison ivy. Please ask her to take cetirizine 20mg  twice daily to help control the itching.   Thanks,  [name], MD [facility]

## 2015-09-11 NOTE — Telephone Encounter (Signed)
Please disregard this dr Ernst Bowler has advise. pm

## 2015-09-11 NOTE — Telephone Encounter (Signed)
Patient called stating she had an appointment yesterday Sept. 7 and saw Dr. Ernst Bowler. She says that she had a rash and that the doctor told her to take Zyrtec, bath in a medication, and gave her a 6 day pack of Prednisone. She says that she followed his instructions and woke up this morning with the rash having spread all over. She says that Dr. [name] would have given her a 21 day run of Prednisone. What else can she do. She isn't happy with the suggested treatment plan.

## 2015-09-11 NOTE — Telephone Encounter (Signed)
Please advise 

## 2015-09-11 NOTE — Telephone Encounter (Signed)
Pt called and said that the poison ivy was spreading and she needs some prednisone called in. She taking the baths but not helping. She has to go to work at 2:00 so please call her before that.[facility] [phone].

## 2015-09-11 NOTE — Telephone Encounter (Signed)
Informed pt of what dr g has stated and sent in prednisone.

## 2015-09-28 ENCOUNTER — Ambulatory Visit: Payer: Self-pay | Admitting: Internal Medicine

## 2015-09-30 ENCOUNTER — Encounter: Payer: Self-pay | Admitting: Internal Medicine

## 2015-09-30 ENCOUNTER — Ambulatory Visit (INDEPENDENT_AMBULATORY_CARE_PROVIDER_SITE_OTHER): Payer: Federal, State, Local not specified - PPO | Admitting: Internal Medicine

## 2015-09-30 VITALS — BP 132/86 | HR 76 | Ht 67.0 in | Wt 213.8 lb

## 2015-09-30 DIAGNOSIS — K529 Noninfective gastroenteritis and colitis, unspecified: Secondary | ICD-10-CM | POA: Diagnosis not present

## 2015-09-30 DIAGNOSIS — IMO0001 Reserved for inherently not codable concepts without codable children: Secondary | ICD-10-CM

## 2015-09-30 DIAGNOSIS — Z8719 Personal history of other diseases of the digestive system: Secondary | ICD-10-CM

## 2015-09-30 DIAGNOSIS — K625 Hemorrhage of anus and rectum: Secondary | ICD-10-CM | POA: Diagnosis not present

## 2015-09-30 NOTE — Patient Instructions (Signed)
You have been scheduled for a colonoscopy. Please follow written instructions given to you at your visit today.  Please pick up your prep supplies at the pharmacy. If you use inhalers (even only as needed), please bring them with you on the day of your procedure.   I appreciate the opportunity to care for you. Carl Gessner, MD, FACG 

## 2015-09-30 NOTE — Progress Notes (Signed)
   Grace Taylor 55 y.o. [birth year] [id number]  Assessment & Plan:   Encounter Diagnoses  Name Primary?  . Rectal bleeding Yes  . Chronic diarrhea   . History of Clostridium difficile     May have IBS and hemorrhoidal bleeding but maybe IBD? Probably not. Formed stool in rectum today makes me think more likely IBS Neoplasia also possible. Last colonoscopy 2009 Orr negative. Negative celiac testing Low fecal elastase unclear significance  Needs colonoscopy/ileoscopy possible random bxs (likely). The risks and benefits as well as alternatives of endoscopic procedure(s) have been discussed and reviewed. All questions answered. The patient agrees to proceed.    Subjective:   Chief Complaint: rectal bleeding and diarrhea  HPI Patient is a middle-aged divorced white woman with a chronic history of irritable bowel problems after she had C. difficile colitis. She was previously followed at [facility], today she complains of frequent bowel movements always soft, difficult wiping and cleansing, and some intermittent mild bleeding and anal discomfort is frequent. It's always sore there. It looks like at some point in last 2 years she had C. difficile positive in 2016 and was treated several times of vancomycin. She had persistent problems afterwards and saw different doctors at [facility], she had a flexible sigmoidoscopy by Dr. Si Gaul that showed formed stool in the rectum, biopsies were taken from the rectum there were unrevealing. She subsequently saw Dr. Oletta Lamas and he is recommended a colonoscopy before some reason she did not follow through with that. She had negative celiac screening. She's had a positive Hemoccult stool. She was not anemic then. C. difficile was diagnosed with toxins. She had a positive lactoferrin. She had a low pancreatic fecal elastase but normal TTG antibody IgA. She reported weight loss.  Medications, allergies, past medical history, past surgical history,  family history and social history are reviewed and updated in the EMR.  Review of Systems As per HPI all other negative  Objective:   Physical Exam @BP  132/86 (BP Location: Right Arm, Patient Position: Sitting, Cuff Size: Normal)   Pulse 76   Ht 5\' 7"  (1.702 m)   Wt 213 lb 12.8 oz (97 kg)   BMI 33.49 kg/m @  General:  Well-developed, well-nourished and in no acute distress Eyes:  anicteric. ENT:   Mouth and posterior pharynx free of lesions.  Neck:   supple w/o thyromegaly or mass.  Lungs: Clear to auscultation bilaterally. Heart:  S1S2, no rubs, murmurs, gallops. Abdomen:  soft, non-tender, no hepatosplenomegaly, hernia, or mass and BS+.   [name], [place] present.  Rectal:  Anoderm inspection revealed mild erythema and slight stool Anal wink was absent Digital exam revealed normal resting tone and voluntary squeeze. There was a large amount of formed stool in the rectum. No mass or rectocele present. Simulated defecation with valsalva revealed appropriate abdominal contraction and descent.    Lymph:  no cervical or supraclavicular adenopathy. Extremities:   no edema, cyanosis or clubbing Skin   no rash. Neuro:  A&O x 3.  Psych:  appropriate mood and  Affect.   Data Reviewed:  See HPI - flex sig, path GI notes and labs reviewed

## 2015-10-07 ENCOUNTER — Encounter: Payer: Self-pay | Admitting: Internal Medicine

## 2015-10-08 ENCOUNTER — Ambulatory Visit (AMBULATORY_SURGERY_CENTER): Payer: Federal, State, Local not specified - PPO | Admitting: Internal Medicine

## 2015-10-08 ENCOUNTER — Encounter: Payer: Self-pay | Admitting: Internal Medicine

## 2015-10-08 VITALS — BP 131/74 | HR 77 | Temp 98.6°F | Resp 14 | Ht 67.0 in | Wt 213.0 lb

## 2015-10-08 DIAGNOSIS — D120 Benign neoplasm of cecum: Secondary | ICD-10-CM | POA: Diagnosis not present

## 2015-10-08 DIAGNOSIS — K625 Hemorrhage of anus and rectum: Secondary | ICD-10-CM

## 2015-10-08 DIAGNOSIS — R195 Other fecal abnormalities: Secondary | ICD-10-CM | POA: Diagnosis not present

## 2015-10-08 DIAGNOSIS — R197 Diarrhea, unspecified: Secondary | ICD-10-CM | POA: Diagnosis not present

## 2015-10-08 MED ORDER — SODIUM CHLORIDE 0.9 % IV SOLN: 500.0000 mL | INTRAVENOUS | Status: DC

## 2015-10-08 NOTE — Patient Instructions (Addendum)
I found and removed one small polyp - not a problem.   I also took biopsies of the colon to see if there is microscopic inflammation - I suspect you are experiencing Irritable Bowel Syndrome or IBS problems as a result of the C diff infections.  I appreciate the opportunity to care for you. Gatha Mayer, MD, FACG   YOU HAD AN ENDOSCOPIC PROCEDURE TODAY AT [facility]:   Refer to the procedure report that was given to you for any specific questions about what was found during the examination.  If the procedure report does not answer your questions, please call your gastroenterologist to clarify.  If you requested that your care partner not be given the details of your procedure findings, then the procedure report has been included in a sealed envelope for you to review at your convenience later.  YOU SHOULD EXPECT: Some feelings of bloating in the abdomen. Passage of more gas than usual.  Walking can help get rid of the air that was put into your GI tract during the procedure and reduce the bloating. If you had a lower endoscopy (such as a colonoscopy or flexible sigmoidoscopy) you may notice spotting of blood in your stool or on the toilet paper. If you underwent a bowel prep for your procedure, you may not have a normal bowel movement for a few days.  Please Note:  You might notice some irritation and congestion in your nose or some drainage.  This is from the oxygen used during your procedure.  There is no need for concern and it should clear up in a day or so.  SYMPTOMS TO REPORT IMMEDIATELY:   Following lower endoscopy (colonoscopy or flexible sigmoidoscopy):  Excessive amounts of blood in the stool  Significant tenderness or worsening of abdominal pains  Swelling of the abdomen that is new, acute  Fever of 100F or higher  For urgent or emergent issues, a gastroenterologist can be reached at any hour by calling [phone].  DIET:  We do recommend a small  meal at first, but then you may proceed to your regular diet.  Drink plenty of fluids but you should avoid alcoholic beverages for 24 hours.  ACTIVITY:  You should plan to take it easy for the rest of today and you should NOT DRIVE or use heavy machinery until tomorrow (because of the sedation medicines used during the test).    FOLLOW UP: Our staff will call the number listed on your records the next business day following your procedure to check on you and address any questions or concerns that you may have regarding the information given to you following your procedure. If we do not reach you, we will leave a message.  However, if you are feeling well and you are not experiencing any problems, there is no need to return our call.  We will assume that you have returned to your regular daily activities without incident.  If any biopsies were taken you will be contacted by phone or by letter within the next 1-3 weeks.  Please call us at [phone] if you have not heard about the biopsies in 3 weeks.   SIGNATURES/CONFIDENTIALITY: You and/or your care partner have signed paperwork which will be entered into your electronic medical record.  These signatures attest to the fact that that the information above on your After Visit Summary has been reviewed and is understood.  Full responsibility of the confidentiality of this discharge information lies  with you and/or your care-partner.  Await pathology  Please read over handout about polyps  Please continue your normal medications

## 2015-10-08 NOTE — Progress Notes (Signed)
Report to PACU, RN, vss, BBS= Clear.  

## 2015-10-08 NOTE — Op Note (Signed)
[facility] Patient Name: Grace Taylor Procedure Date: 10/08/2015 3:56 PM MRN: [id number] Endoscopist: Gatha Mayer , MD Age: 55 Referring MD:  Date of Birth: [birth year] Gender: Female Account #: [id number] Procedure:                Colonoscopy Indications:              Clinically significant diarrhea of unexplained                            origin, Heme positive stool, Rectal bleeding Medicines:                Propofol per Anesthesia, Monitored Anesthesia Care Procedure:                Pre-Anesthesia Assessment:                           - Prior to the procedure, a History and Physical                            was performed, and patient medications and                            allergies were reviewed. The patient's tolerance of                            previous anesthesia was also reviewed. The risks                            and benefits of the procedure and the sedation                            options and risks were discussed with the patient.                            All questions were answered, and informed consent                            was obtained. Prior Anticoagulants: The patient has                            taken no previous anticoagulant or antiplatelet                            agents. ASA Grade Assessment: II - A patient with                            mild systemic disease. After reviewing the risks                            and benefits, the patient was deemed in                            satisfactory condition to undergo the procedure.  After obtaining informed consent, the colonoscope                            was passed under direct vision. Throughout the                            procedure, the patient's blood pressure, pulse, and                            oxygen saturations were monitored continuously. The                            Model CF-HQ190L [id number]) scope was introduced                 through the anus and advanced to the the terminal                            ileum, with identification of the appendiceal                            orifice and IC valve. The colonoscopy was performed                            without difficulty. The terminal ileum, the                            ileocecal valve, the appendiceal orifice and the                            rectum were photographed. The bowel preparation                            used was Miralax. The quality of the bowel                            preparation was excellent. Scope In: 4:06:28 PM Scope Out: 4:22:01 PM Scope Withdrawal Time: 0 hours 10 minutes 51 seconds  Total Procedure Duration: 0 hours 15 minutes 33 seconds  Findings:                 The perianal and digital rectal examinations were                            normal.                           The terminal ileum appeared normal.                           A 4 mm polyp was found in the cecum. The polyp was                            sessile. The polyp was removed with a cold snare.  Resection and retrieval were complete. Verification                            of patient identification for the specimen was                            done. Estimated blood loss was minimal.                           The exam was otherwise without abnormality on                            direct and retroflexion views.                           Biopsies for histology were taken with a cold                            forceps from the entire colon for evaluation of                            microscopic colitis. Complications:            No immediate complications. Estimated Blood Loss:     Estimated blood loss was minimal. Impression:               - The examined portion of the ileum was normal.                           - One 4 mm polyp in the cecum, removed with a cold                            snare. Resected and retrieved.                            - The examination was otherwise normal on direct                            and retroflexion views.                           - Biopsies were taken with a cold forceps from the                            entire colon for evaluation of microscopic colitis. Recommendation:           - Patient has a contact number available for                            emergencies. The signs and symptoms of potential                            delayed complications were discussed with the  patient. Return to normal activities tomorrow.                            Written discharge instructions were provided to the                            patient.                           - Resume previous diet.                           - Continue present medications.                           - Repeat colonoscopy is recommended. The                            colonoscopy date will be determined after pathology                            results from today's exam become available for                            review.                           -                           SEEMS MOST LIKE AN IBS PHENOMONEN                           SHE DID APPARENTLY HAVE A LOW FECAL ELASTASE WHICH                            IS OF UNCLEAR SIGNIFICANCE WHEN SHE WAS HAVING                            PROBLEMS WITH C DIFF.                           MAY NEED TO REPEAT THAT VS EMPIRIC RX FOR FAT                            MALABSORPTION                           WOULD ALSO CONSIDER A COURSE OF Darien Ramus, MD 10/08/2015 4:31:14 PM This report has been signed electronically.

## 2015-10-08 NOTE — Progress Notes (Signed)
Called to room to assist during endoscopic procedure.  Patient ID and intended procedure confirmed with present staff. Received instructions for my participation in the procedure from the performing physician.  

## 2015-10-09 ENCOUNTER — Telehealth: Payer: Self-pay

## 2015-10-09 NOTE — Telephone Encounter (Signed)
  Follow up Call-  Call back number 10/08/2015  Post procedure Call Back phone  # [phone]  Permission to leave phone message Yes  Some recent data might be hidden     Patient questions:  Do you have a fever, pain , or abdominal swelling? No. Pain Score  0 *  Have you tolerated food without any problems? Yes.    Have you been able to return to your normal activities? Yes.    Do you have any questions about your discharge instructions: Diet   No. Medications  No. Follow up visit  No.  Do you have questions or concerns about your Care? No.  Actions: * If pain score is 4 or above: No action needed, pain <4.

## 2015-10-09 NOTE — Telephone Encounter (Signed)
  Follow up Call-  Call back number 10/08/2015  Post procedure Call Back phone  # [phone]  Permission to leave phone message Yes  Some recent data might be hidden    Patient was called for follow up after her procedure on 10/08/2015. No answer at the number given for follow up phone call. A message was left on the answering machine.

## 2015-10-16 NOTE — Progress Notes (Signed)
Call from office   Polyp benign/pre-cancerous Biopsies show normal colon lining - no colitis Diagnosis is IBS She can use loperamide prn If that is not effective let me know  [place]  No letter Colon recall 5 years

## 2015-10-22 ENCOUNTER — Encounter: Payer: Self-pay | Admitting: Internal Medicine

## 2016-04-04 ENCOUNTER — Other Ambulatory Visit: Payer: Self-pay | Admitting: Internal Medicine

## 2016-04-04 DIAGNOSIS — Z1231 Encounter for screening mammogram for malignant neoplasm of breast: Secondary | ICD-10-CM

## 2016-05-18 ENCOUNTER — Ambulatory Visit: Payer: Self-pay

## 2016-05-25 ENCOUNTER — Ambulatory Visit
Admission: RE | Admit: 2016-05-25 | Discharge: 2016-05-25 | Disposition: A | Discharge status: Home / Self Care | Payer: Federal, State, Local not specified - PPO | Source: Ambulatory Visit | Attending: Internal Medicine | Admitting: Internal Medicine

## 2016-05-25 DIAGNOSIS — Z1231 Encounter for screening mammogram for malignant neoplasm of breast: Secondary | ICD-10-CM

## 2016-08-10 ENCOUNTER — Other Ambulatory Visit (HOSPITAL_COMMUNITY)
Admission: RE | Admit: 2016-08-10 | Discharge: 2016-08-10 | Disposition: A | Discharge status: Home / Self Care | Payer: Federal, State, Local not specified - PPO | Source: Ambulatory Visit | Attending: Internal Medicine | Admitting: Internal Medicine

## 2016-08-10 ENCOUNTER — Other Ambulatory Visit: Payer: Self-pay | Admitting: Internal Medicine

## 2016-08-10 DIAGNOSIS — Z01419 Encounter for gynecological examination (general) (routine) without abnormal findings: Secondary | ICD-10-CM | POA: Diagnosis not present

## 2016-08-10 LAB — HEPATIC FUNCTION PANEL
ALK PHOS: 120 (ref 25–125)
ALT: 19 (ref 7–35)
AST: 20 (ref 13–35)
Bilirubin, Direct: 0.4

## 2016-08-10 LAB — LIPID PANEL
Cholesterol: 203 — AB (ref 0–200)
HDL: 43 (ref 35–70)
LDL CALC: 122
TRIGLYCERIDES: 196 — AB (ref 40–160)

## 2016-08-10 LAB — BASIC METABOLIC PANEL
BUN: 13 (ref 4–21)
Creatinine: 0.8 (ref <=1.1)
GLUCOSE: 91
Potassium: 4.1 (ref 3.4–5.3)
Sodium: 141 (ref 137–147)

## 2016-08-10 LAB — CBC AND DIFFERENTIAL
HEMATOCRIT: 38 (ref 36–46)
HEMOGLOBIN: 13.3 (ref 12.0–16.0)
PLATELETS: 209 (ref 150–399)
WBC: 6.3

## 2016-08-15 LAB — CYTOLOGY - PAP: DIAGNOSIS: NEGATIVE

## 2016-09-02 ENCOUNTER — Other Ambulatory Visit: Payer: Self-pay | Admitting: Orthopedic Surgery

## 2016-09-02 DIAGNOSIS — R5381 Other malaise: Secondary | ICD-10-CM

## 2016-09-27 ENCOUNTER — Other Ambulatory Visit: Payer: Self-pay | Admitting: Orthopedic Surgery

## 2016-09-27 DIAGNOSIS — M79671 Pain in right foot: Secondary | ICD-10-CM

## 2016-09-27 DIAGNOSIS — M546 Pain in thoracic spine: Secondary | ICD-10-CM

## 2016-09-27 DIAGNOSIS — M542 Cervicalgia: Secondary | ICD-10-CM

## 2016-10-08 NOTE — Progress Notes (Signed)
Cardiology Office Note   Date:  10/11/2016   ID:  Grace Taylor, DOB 1960/10/16, MRN [id number]  PCP:  [name], MD  Cardiologist:   Minus Breeding, MD    Chief Complaint  Patient presents with  . Shortness of Breath      History of Present Illness: Grace Taylor is a 56 y.o. female who presents for evaluation of strong family history, dizziness, episodes of shortness of breath and some chest discomfort. She has occasional sharp stabbing chest pain. This is sporadic. She has episodes where she feels like she stops breathing and that her heart might be skipping. She's not had any prior cardiac workup or history. She does have a family history of her father having heart disease age. She doesn't exercise routinely though she's on her feet working at the [employer]. She does have dizziness which she is convinced is related to Crestor. This dose was doubled in the past year and she was told to take it at night as she thought it was causing some dizziness. She thinks interferes with her sleep. She's under stress at work and also her mother has been very ill.     Past Medical History:  Diagnosis Date  . Allergy   . Anemia   . Clostridium difficile infection   . Hyperlipidemia   . Ulcer    age 45    Past Surgical History:  Procedure Laterality Date  . CHOLECYSTECTOMY  2012  . COLONOSCOPY  2014  . ESOPHAGOGASTRODUODENOSCOPY  2014  . FLEXIBLE SIGMOIDOSCOPY  03/2014  . OVARIAN CYST REMOVAL    . right shoulder surgery    . TMJ ARTHROSCOPY       Current Outpatient Prescriptions  Medication Sig Dispense Refill  . clonazePAM (KLONOPIN) 0.5 MG tablet Take 1 tablet by mouth 2 (two) times daily.  1  . diphenhydrAMINE (BENADRYL) 25 MG tablet Take 25 mg by mouth as needed.    . fluticasone (FLONASE) 50 MCG/ACT nasal spray Place 2 sprays into both nostrils daily.     Marland Kitchen loratadine-pseudoephedrine (CLARITIN-D 24-HOUR) 10-240 MG per 24 hr tablet Take 1  tablet by mouth daily.    . Multiple Vitamin (MULITIVITAMIN WITH MINERALS) TABS Take 1 tablet by mouth daily.    Marland Kitchen zolpidem (AMBIEN) 10 MG tablet Take 10 mg by mouth at bedtime.     Marland Kitchen atorvastatin (LIPITOR) 40 MG tablet Take 1 tablet (40 mg total) by mouth daily. 90 tablet 3   Current Facility-Administered Medications  Medication Dose Route Frequency Provider Last Rate Last Dose  . 0.9 %  sodium chloride infusion  500 mL Intravenous Continuous Gatha Mayer, MD        Allergies:   Codeine; Penicillins; and Sulfonamide derivatives    Social History:  The patient  reports that she has never smoked. She has never used smokeless tobacco. She reports that she drinks alcohol. She reports that she does not use drugs.   Family History:  The patient's family history includes Breast cancer in her maternal aunt; CAD (age of onset: 60) in her father; Hypertension in her brother, father, and mother.    ROS:  Please see the history of present illness.   Otherwise, review of systems are positive for none.   All other systems are reviewed and negative.    PHYSICAL EXAM: VS:  BP (!) 128/92   Pulse 88   Ht 5\' 7"  (1.702 m)   Wt 224 lb (101.6 kg)  BMI 35.08 kg/m  , BMI Body mass index is 35.08 kg/m. GENERAL:  Well appearing HEENT:  Pupils equal round and reactive, fundi not visualized, oral mucosa unremarkable NECK:  No jugular venous distention, waveform within normal limits, carotid upstroke brisk and symmetric, no bruits, no thyromegaly LYMPHATICS:  No cervical, inguinal adenopathy LUNGS:  Clear to auscultation bilaterally BACK:  No CVA tenderness CHEST:  Unremarkable HEART:  PMI not displaced or sustained,S1 and S2 within normal limits, no S3, no S4, no clicks, no rubs, no murmurs ABD:  Flat, positive bowel sounds normal in frequency in pitch, no bruits, no rebound, no guarding, no midline pulsatile mass, no hepatomegaly, no splenomegaly EXT:  2 plus pulses throughout, no edema, no cyanosis  no clubbing SKIN:  No rashes no nodules NEURO:  Cranial nerves II through XII grossly intact, motor grossly intact throughout PSYCH:  Cognitively intact, oriented to person place and time    EKG:  EKG is ordered today. The ekg ordered today demonstrates sinus rhythm, rate 83, axis within normal limits, intervals within normal limits, no acute ST-T wave changes, premature ventricular contractions.   Recent Labs: No results found for requested labs within last 8760 hours.    Lipid Panel    Wt Readings from Last 3 Encounters:  10/10/16 224 lb (101.6 kg)  10/08/15 213 lb (96.6 kg)  09/30/15 213 lb 12.8 oz (97 kg)      Other studies Reviewed: Additional studies/ records that were reviewed today include: labs. Review of the above records demonstrates:  Please see elsewhere in the note.     ASSESSMENT AND PLAN:  CHEST PAIN:  She has some atypical pain but she does have risk factors.  I will bring the patient back for a POET (Plain Old Exercise Test). This will allow me to screen for obstructive coronary disease, risk stratify and very importantly provide a prescription for exercise.  DYSLIPIDEMIA:  I was able to retrieve some blood work and her LDL recently was 122 with an HDL of 43. I'm going to switch her to Lipitor 40 mg daily since she doesn't think she tolerates the Crestor. Eventually I would like to consider getting a coronary calcium score to further guide the need for an targets for therapy.  As it stands her risk score is 2% by [employer] but this does not consider family history.  Score is 3.3% by AHA.  Coronary calcium could add to this risk predictor via MESA.    PVCs.  I don't think she has symptoms with this but I will look for any inducible arrhythmias on POET (Plain Old Exercise Treadmill).     Current medicines are reviewed at length with the patient today.  The patient does not have concerns regarding medicines.  The following changes have been made:  no  change  Labs/ tests ordered today include:   Orders Placed This Encounter  Procedures  . Exercise Tolerance Test  . EKG 12-Lead     Disposition:   FU with me after this testing.     Signed, Minus Breeding, MD  10/11/2016 9:49 PM    [place]

## 2016-10-10 ENCOUNTER — Encounter: Payer: Self-pay | Admitting: Cardiology

## 2016-10-10 ENCOUNTER — Encounter (INDEPENDENT_AMBULATORY_CARE_PROVIDER_SITE_OTHER): Payer: Self-pay

## 2016-10-10 ENCOUNTER — Ambulatory Visit (INDEPENDENT_AMBULATORY_CARE_PROVIDER_SITE_OTHER): Payer: Federal, State, Local not specified - PPO | Admitting: Cardiology

## 2016-10-10 VITALS — BP 128/92 | HR 88 | Ht 67.0 in | Wt 224.0 lb

## 2016-10-10 DIAGNOSIS — E785 Hyperlipidemia, unspecified: Secondary | ICD-10-CM

## 2016-10-10 DIAGNOSIS — I493 Ventricular premature depolarization: Secondary | ICD-10-CM | POA: Diagnosis not present

## 2016-10-10 DIAGNOSIS — R079 Chest pain, unspecified: Secondary | ICD-10-CM

## 2016-10-10 MED ORDER — ATORVASTATIN CALCIUM 40 MG PO TABS: 40.0000 mg | ORAL_TABLET | Freq: Every day | ORAL | 3 refills | Status: DC

## 2016-10-10 NOTE — Patient Instructions (Signed)
Medication Instructions:  STOP- Crestor START= Atorvastatin 40 mg daily  If you need a refill on your cardiac medications before your next appointment, please call your pharmacy.  Labwork: None Ordered  Testing/Procedures: Your physician has requested that you have an exercise tolerance test. For further information please visit [email or web address]. Please also follow instruction sheet, as given.   Follow-Up: Your physician wants you to follow-up in: After Test.    Thank you for choosing [facility] at [facility]!!

## 2016-10-11 ENCOUNTER — Encounter: Payer: Self-pay | Admitting: Cardiology

## 2016-10-11 DIAGNOSIS — R079 Chest pain, unspecified: Secondary | ICD-10-CM | POA: Insufficient documentation

## 2016-10-11 DIAGNOSIS — I493 Ventricular premature depolarization: Secondary | ICD-10-CM | POA: Insufficient documentation

## 2016-10-11 DIAGNOSIS — E785 Hyperlipidemia, unspecified: Secondary | ICD-10-CM | POA: Insufficient documentation

## 2016-10-13 ENCOUNTER — Telehealth (HOSPITAL_COMMUNITY): Payer: Self-pay

## 2016-10-13 NOTE — Telephone Encounter (Signed)
Encounter complete. 

## 2016-10-19 ENCOUNTER — Ambulatory Visit (HOSPITAL_COMMUNITY)
Admission: RE | Admit: 2016-10-19 | Discharge: 2016-10-19 | Disposition: A | Discharge status: Home / Self Care | Payer: Federal, State, Local not specified - PPO | Source: Ambulatory Visit | Attending: Cardiovascular Disease | Admitting: Cardiovascular Disease

## 2016-10-19 DIAGNOSIS — R079 Chest pain, unspecified: Secondary | ICD-10-CM

## 2016-10-19 LAB — EXERCISE TOLERANCE TEST
CHL CUP MPHR: 162 {beats}/min
CHL CUP RESTING HR STRESS: 85 {beats}/min
CSEPHR: 105 %
CSEPPHR: 171 {beats}/min
Estimated workload: 7.6 METS
Exercise duration (min): 6 min
Exercise duration (sec): 28 s
RPE: 18

## 2016-10-28 ENCOUNTER — Encounter: Payer: Self-pay | Admitting: *Deleted

## 2016-12-04 NOTE — Progress Notes (Deleted)
Cardiology Office Note   Date:  12/04/2016   ID:  Grace Taylor, DOB 06/25/1960, MRN [id number]  PCP:  [name], MD  Cardiologist:   Minus Breeding, MD    No chief complaint on file.     History of Present Illness: Grace Taylor is a 56 y.o. female who presents for evaluation of dizziness and SOB.  I sent her for a POET (Plain Old Exercise Treadmill) which was negative for ischemia.   ***   strong family history, dizziness, episodes of shortness of breath and some chest discomfort. She has occasional sharp stabbing chest pain. This is sporadic. She has episodes where she feels like she stops breathing and that her heart might be skipping. She's not had any prior cardiac workup or history. She does have a family history of her father having heart disease age. She doesn't exercise routinely though she's on her feet working at the [employer]. She does have dizziness which she is convinced is related to Crestor. This dose was doubled in the past year and she was told to take it at night as she thought it was causing some dizziness. She thinks interferes with her sleep. She's under stress at work and also her mother has been very ill.     Past Medical History:  Diagnosis Date  . Allergy   . Anemia   . Clostridium difficile infection   . Hyperlipidemia   . Ulcer    age 53    Past Surgical History:  Procedure Laterality Date  . CHOLECYSTECTOMY  2012  . COLONOSCOPY  2014  . ESOPHAGOGASTRODUODENOSCOPY  2014  . FLEXIBLE SIGMOIDOSCOPY  03/2014  . OVARIAN CYST REMOVAL    . right shoulder surgery    . TMJ ARTHROSCOPY       Current Outpatient Medications  Medication Sig Dispense Refill  . atorvastatin (LIPITOR) 40 MG tablet Take 1 tablet (40 mg total) by mouth daily. 90 tablet 3  . clonazePAM (KLONOPIN) 0.5 MG tablet Take 1 tablet by mouth 2 (two) times daily.  1  . diphenhydrAMINE (BENADRYL) 25 MG tablet Take 25 mg by mouth as needed.    .  fluticasone (FLONASE) 50 MCG/ACT nasal spray Place 2 sprays into both nostrils daily.     Marland Kitchen loratadine-pseudoephedrine (CLARITIN-D 24-HOUR) 10-240 MG per 24 hr tablet Take 1 tablet by mouth daily.    . Multiple Vitamin (MULITIVITAMIN WITH MINERALS) TABS Take 1 tablet by mouth daily.    Marland Kitchen zolpidem (AMBIEN) 10 MG tablet Take 10 mg by mouth at bedtime.      Current Facility-Administered Medications  Medication Dose Route Frequency Provider Last Rate Last Dose  . 0.9 %  sodium chloride infusion  500 mL Intravenous Continuous Gatha Mayer, MD        Allergies:   Codeine; Penicillins; and Sulfonamide derivatives     ROS:  Please see the history of present illness.   Otherwise, review of systems are positive for ***.   All other systems are reviewed and negative.    PHYSICAL EXAM: VS:  There were no vitals taken for this visit. , BMI There is no height or weight on file to calculate BMI.  GENERAL:  Well appearing NECK:  No jugular venous distention, waveform within normal limits, carotid upstroke brisk and symmetric, no bruits, no thyromegaly LUNGS:  Clear to auscultation bilaterally CHEST:  Unremarkable HEART:  PMI not displaced or sustained,S1 and S2 within normal limits, no S3, no S4, no  clicks, no rubs, *** murmurs ABD:  Flat, positive bowel sounds normal in frequency in pitch, no bruits, no rebound, no guarding, no midline pulsatile mass, no hepatomegaly, no splenomegaly EXT:  2 plus pulses throughout, no edema, no cyanosis no clubbing   GENERAL:  Well appearing HEENT:  Pupils equal round and reactive, fundi not visualized, oral mucosa unremarkable NECK:  No jugular venous distention, waveform within normal limits, carotid upstroke brisk and symmetric, no bruits, no thyromegaly LYMPHATICS:  No cervical, inguinal adenopathy LUNGS:  Clear to auscultation bilaterally BACK:  No CVA tenderness CHEST:  Unremarkable HEART:  PMI not displaced or sustained,S1 and S2 within normal limits,  no S3, no S4, no clicks, no rubs, no murmurs ABD:  Flat, positive bowel sounds normal in frequency in pitch, no bruits, no rebound, no guarding, no midline pulsatile mass, no hepatomegaly, no splenomegaly EXT:  2 plus pulses throughout, no edema, no cyanosis no clubbing SKIN:  No rashes no nodules NEURO:  Cranial nerves II through XII grossly intact, motor grossly intact throughout PSYCH:  Cognitively intact, oriented to person place and time   EKG:  EKG is *** ordered today. The ekg ordered today demonstrates sinus rhythm, rate 83, axis within normal limits, intervals within normal limits, no acute ST-T wave changes, premature ventricular contractions.   Recent Labs: No results found for requested labs within last 8760 hours.    Lipid Panel    Wt Readings from Last 3 Encounters:  10/10/16 224 lb (101.6 kg)  10/08/15 213 lb (96.6 kg)  09/30/15 213 lb 12.8 oz (97 kg)      Other studies Reviewed: Additional studies/ records that were reviewed today include: *** Review of the above records demonstrates:   ***   ASSESSMENT AND PLAN:  CHEST PAIN:  ***  She has some atypical pain but she does have risk factors.  I will bring the patient back for a POET (Plain Old Exercise Test). This will allow me to screen for obstructive coronary disease, risk stratify and very importantly provide a prescription for exercise.  DYSLIPIDEMIA:  *** I was able to retrieve some blood work and her LDL recently was 122 with an HDL of 43. I'm going to switch her to Lipitor 40 mg daily since she doesn't think she tolerates the Crestor. Eventually I would like to consider getting a coronary calcium score to further guide the need for an targets for therapy.  As it stands her risk score is 2% by [place] but this does not consider family history.  Score is 3.3% by AHA.  Coronary calcium could add to this risk predictor via MESA.    PVCs.  ***  I don't think she has symptoms with this but I will look for any  inducible arrhythmias on POET (Plain Old Exercise Treadmill).     Current medicines are reviewed at length with the patient today.  The patient does not have concerns regarding medicines.  The following changes have been made:  ***  Labs/ tests ordered today include:  ***  No orders of the defined types were placed in this encounter.    Disposition:   FU with me ***   Signed, Minus Breeding, MD  12/04/2016 9:28 PM    [facility]

## 2016-12-06 ENCOUNTER — Ambulatory Visit: Payer: Federal, State, Local not specified - PPO | Admitting: Cardiology

## 2017-04-07 ENCOUNTER — Other Ambulatory Visit: Payer: Self-pay

## 2017-04-07 ENCOUNTER — Ambulatory Visit: Payer: Self-pay | Admitting: Family Medicine

## 2017-04-07 ENCOUNTER — Encounter: Payer: Self-pay | Admitting: Family Medicine

## 2017-04-07 VITALS — BP 138/84 | HR 116 | Temp 98.7°F | Resp 16 | Ht 66.0 in | Wt 220.6 lb

## 2017-04-07 DIAGNOSIS — B9689 Other specified bacterial agents as the cause of diseases classified elsewhere: Secondary | ICD-10-CM

## 2017-04-07 DIAGNOSIS — M542 Cervicalgia: Secondary | ICD-10-CM

## 2017-04-07 DIAGNOSIS — I493 Ventricular premature depolarization: Secondary | ICD-10-CM

## 2017-04-07 DIAGNOSIS — K635 Polyp of colon: Secondary | ICD-10-CM

## 2017-04-07 DIAGNOSIS — Z8711 Personal history of peptic ulcer disease: Secondary | ICD-10-CM | POA: Insufficient documentation

## 2017-04-07 DIAGNOSIS — F4310 Post-traumatic stress disorder, unspecified: Secondary | ICD-10-CM | POA: Insufficient documentation

## 2017-04-07 DIAGNOSIS — Z8619 Personal history of other infectious and parasitic diseases: Secondary | ICD-10-CM | POA: Insufficient documentation

## 2017-04-07 DIAGNOSIS — K589 Irritable bowel syndrome without diarrhea: Secondary | ICD-10-CM

## 2017-04-07 DIAGNOSIS — K580 Irritable bowel syndrome with diarrhea: Secondary | ICD-10-CM

## 2017-04-07 DIAGNOSIS — J208 Acute bronchitis due to other specified organisms: Secondary | ICD-10-CM

## 2017-04-07 DIAGNOSIS — G8929 Other chronic pain: Secondary | ICD-10-CM

## 2017-04-07 HISTORY — DX: Irritable bowel syndrome, unspecified: K58.9

## 2017-04-07 HISTORY — DX: Other chronic pain: G89.29

## 2017-04-07 HISTORY — DX: Cervicalgia: M54.2

## 2017-04-07 HISTORY — DX: Polyp of colon: K63.5

## 2017-04-07 HISTORY — DX: Personal history of peptic ulcer disease: Z87.11

## 2017-04-07 HISTORY — DX: Post-traumatic stress disorder, unspecified: F43.10

## 2017-04-07 MED ORDER — AZITHROMYCIN 250 MG PO TABS: ORAL_TABLET | ORAL | 0 refills | Status: DC

## 2017-04-07 MED ORDER — BENZONATATE 100 MG PO CAPS: 100.0000 mg | ORAL_CAPSULE | Freq: Two times a day (BID) | ORAL | 0 refills | Status: DC | PRN
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
## 2017-04-07 NOTE — Patient Instructions (Addendum)
Please return in 3 months for your annual complete physical; please come fasting. Please schedule your mammogram for May 2018 with the breast center.    It was a pleasure meeting you today! Thank you for choosing [place] to meet your healthcare needs! I truly look forward to working with you. If you have any questions or concerns, please send me a message via Mychart or call the office at [phone].  Acute Bronchitis, Adult Acute bronchitis is sudden (acute) swelling of the air tubes (bronchi) in the lungs. Acute bronchitis causes these tubes to fill with mucus, which can make it hard to breathe. It can also cause coughing or wheezing. In adults, acute bronchitis usually goes away within 2 weeks. A cough caused by bronchitis may last up to 3 weeks. Smoking, allergies, and asthma can make the condition worse. Repeated episodes of bronchitis may cause further lung problems, such as chronic obstructive pulmonary disease (COPD). What are the causes? This condition can be caused by germs and by substances that irritate the lungs, including:  Cold and flu viruses. This condition is most often caused by the same virus that causes a cold.  Bacteria.  Exposure to tobacco smoke, dust, fumes, and air pollution.  What increases the risk? This condition is more likely to develop in people who:  Have close contact with someone with acute bronchitis.  Are exposed to lung irritants, such as tobacco smoke, dust, fumes, and vapors.  Have a weak immune system.  Have a respiratory condition such as asthma.  What are the signs or symptoms? Symptoms of this condition include:  A cough.  Coughing up clear, yellow, or green mucus.  Wheezing.  Chest congestion.  Shortness of breath.  A fever.  Body aches.  Chills.  A sore throat.  How is this diagnosed? This condition is usually diagnosed with a physical exam. During the exam, your health care provider may order tests, such as chest X-rays,  to rule out other conditions. He or she may also:  Test a sample of your mucus for bacterial infection.  Check the level of oxygen in your blood. This is done to check for pneumonia.  Do a chest X-ray or lung function testing to rule out pneumonia and other conditions.  Perform blood tests.  Your health care provider will also ask about your symptoms and medical history. How is this treated? Most cases of acute bronchitis clear up over time without treatment. Your health care provider may recommend:  Drinking more fluids. Drinking more makes your mucus thinner, which may make it easier to breathe.  Taking a medicine for a fever or cough.  Taking an antibiotic medicine.  Using an inhaler to help improve shortness of breath and to control a cough.  Using a cool mist vaporizer or humidifier to make it easier to breathe.  Follow these instructions at home: Medicines  Take over-the-counter and prescription medicines only as told by your health care provider.  If you were prescribed an antibiotic, take it as told by your health care provider. Do not stop taking the antibiotic even if you start to feel better. General instructions  Get plenty of rest.  Drink enough fluids to keep your urine clear or pale yellow.  Avoid smoking and secondhand smoke. Exposure to cigarette smoke or irritating chemicals will make bronchitis worse. If you smoke and you need help quitting, ask your health care provider. Quitting smoking will help your lungs heal faster.  Use an inhaler, cool mist vaporizer, or  humidifier as told by your health care provider.  Keep all follow-up visits as told by your health care provider. This is important. How is this prevented? To lower your risk of getting this condition again:  Wash your hands often with soap and water. If soap and water are not available, use hand sanitizer.  Avoid contact with people who have cold symptoms.  Try not to touch your hands to  your mouth, nose, or eyes.  Make sure to get the flu shot every year.  Contact a health care provider if:  Your symptoms do not improve in 2 weeks of treatment. Get help right away if:  You cough up blood.  You have chest pain.  You have severe shortness of breath.  You become dehydrated.  You faint or keep feeling like you are going to faint.  You keep vomiting.  You have a severe headache.  Your fever or chills gets worse. This information is not intended to replace advice given to you by your health care provider. Make sure you discuss any questions you have with your health care provider. Document Released: 01/28/2004 Document Revised: 07/15/2015 Document Reviewed: 06/10/2015 Elsevier Interactive Patient Education  [employer].

## 2017-04-09 ENCOUNTER — Other Ambulatory Visit: Payer: Self-pay | Admitting: Family Medicine

## 2017-04-22 ENCOUNTER — Encounter (HOSPITAL_COMMUNITY): Payer: Self-pay | Admitting: Emergency Medicine

## 2017-04-22 ENCOUNTER — Other Ambulatory Visit: Payer: Self-pay

## 2017-04-22 ENCOUNTER — Telehealth (HOSPITAL_COMMUNITY): Payer: Self-pay | Admitting: *Deleted

## 2017-04-22 ENCOUNTER — Ambulatory Visit (HOSPITAL_COMMUNITY)
Admission: EM | Admit: 2017-04-22 | Discharge: 2017-04-22 | Disposition: A | Discharge status: Home / Self Care | Payer: Self-pay | Attending: Family Medicine | Admitting: Family Medicine

## 2017-04-22 DIAGNOSIS — J040 Acute laryngitis: Secondary | ICD-10-CM

## 2017-04-22 MED ORDER — CEPHALEXIN 500 MG PO CAPS: 500.0000 mg | ORAL_CAPSULE | Freq: Four times a day (QID) | ORAL | 0 refills | Status: DC

## 2017-04-22 MED ORDER — BENZONATATE 100 MG PO CAPS: 100.0000 mg | ORAL_CAPSULE | Freq: Three times a day (TID) | ORAL | 1 refills | Status: DC | PRN

## 2017-04-22 NOTE — ED Triage Notes (Signed)
States visited another doctor near the first of April 2019 and was Dx with bronchitis, given Atb, pt is unsure when last dose was taken. C/o cough, sore throat and hoarseness onset one month

## 2017-04-22 NOTE — Discharge Instructions (Addendum)
Continue supportive care.  Aleve, 2 tabs twice daily to help with pain.   Salt water gargles are sometimes helpful.   If this does not get better, an ENT provider (Ear, nose and throat) would be the next step.

## 2017-04-22 NOTE — ED Provider Notes (Signed)
  [facility]    CSN: [id number] Arrival date & time: 04/22/17  1201  Chief Complaint  Patient presents with  . Cough    Grace Taylor here for URI complaints.  Duration: 1 month  Associated symptoms: sore throat, hoarseness/losing voice and cough Denies: sinus congestion, sinus pain, rhinorrhea, itchy watery eyes, ear pain, ear drainage, wheezing, shortness of breath, myalgia and fevers Treatment to date: Azithromycin, Tessalon Perles (helped a lot) Sick contacts: No  No hx or s/s's of reflux.   ROS:  Const: Denies fevers HEENT: As noted in HPI Lungs: No SOB  Past Medical History:  Diagnosis Date  . Allergy   . Anemia   . Benign colon polyp 04/07/2017   Q 5 year recall  . Chronic neck pain - post injury, worker's comp 2013, 2018 04/07/2017   Dr. Mardelle Matte  . Clostridium difficile infection   . History of peptic ulcer 04/07/2017  . Hyperlipidemia   . IBS (irritable bowel syndrome) 04/07/2017   Colonoscopy by Carlean Purl  . Migraines   . PTSD (post-traumatic stress disorder) 04/07/2017  . Ulcer    age 10   Family History  Problem Relation Age of Onset  . Hypertension Mother   . Alcohol abuse Mother   . Arthritis Mother   . Cancer Mother   . Heart disease Mother   . Kidney disease Mother   . Mental illness Mother   . Hypertension Father   . CAD Father 15       CABG   . Hyperlipidemia Father   . Heart disease Father   . Early death Father   . Heart attack Father   . Hypertension Brother   . Alcohol abuse Brother   . Heart disease Brother   . Breast cancer Maternal Aunt   . Cancer Maternal Aunt        breast cancer  . Hypertension Sister   . Hyperlipidemia Sister   . Arthritis Sister     BP (!) 155/89 (BP Location: Left Arm)   Pulse (!) 107   Temp 97.8 F (36.6 C) (Oral)   Resp 18   SpO2 99%  General: Awake, alert, appears stated age HEENT: AT, [place], ears patent b/l and TM's neg, nares patent w/o discharge, pharynx pink and without exudates,  MMM Neck: No masses or asymmetry, +ttp over LN's Heart: RRR Lungs: CTAB, no accessory muscle use Psych: Age appropriate judgment and insight, normal mood and affect  Laryngitis  Tx empirically w Keflex, if no improvement, will need to f/u with ENT or PCP for further management. Tessalon Perles for symptomatic management. Continue to push fluids, practice good hand hygiene, cover mouth when coughing. Pt voiced understanding and agreement to the plan.     Shelda Pal, [place] 04/22/17 1722

## 2017-07-04 ENCOUNTER — Encounter: Payer: Self-pay | Admitting: Family Medicine

## 2018-03-01 DIAGNOSIS — M7522 Bicipital tendinitis, left shoulder: Secondary | ICD-10-CM | POA: Insufficient documentation

## 2018-03-01 DIAGNOSIS — M19071 Primary osteoarthritis, right ankle and foot: Secondary | ICD-10-CM | POA: Insufficient documentation

## 2018-06-01 DIAGNOSIS — M75112 Incomplete rotator cuff tear or rupture of left shoulder, not specified as traumatic: Secondary | ICD-10-CM | POA: Insufficient documentation

## 2018-10-17 ENCOUNTER — Encounter: Payer: Self-pay | Admitting: Podiatry

## 2018-10-17 ENCOUNTER — Ambulatory Visit (INDEPENDENT_AMBULATORY_CARE_PROVIDER_SITE_OTHER): Payer: Medicare HMO

## 2018-10-17 ENCOUNTER — Ambulatory Visit: Payer: Medicare HMO | Admitting: Podiatry

## 2018-10-17 ENCOUNTER — Other Ambulatory Visit: Payer: Self-pay

## 2018-10-17 ENCOUNTER — Other Ambulatory Visit: Payer: Self-pay | Admitting: Podiatry

## 2018-10-17 VITALS — BP 116/73 | HR 90 | Resp 16

## 2018-10-17 DIAGNOSIS — M7671 Peroneal tendinitis, right leg: Secondary | ICD-10-CM

## 2018-10-17 DIAGNOSIS — S9001XA Contusion of right ankle, initial encounter: Secondary | ICD-10-CM | POA: Diagnosis not present

## 2018-10-17 DIAGNOSIS — M722 Plantar fascial fibromatosis: Secondary | ICD-10-CM

## 2018-10-17 DIAGNOSIS — S9031XA Contusion of right foot, initial encounter: Secondary | ICD-10-CM | POA: Diagnosis not present

## 2018-10-17 NOTE — Progress Notes (Signed)
   Subjective:    Patient ID: Grace Taylor, female    DOB: 01-07-1960, 58 y.o.   MRN: [id number]  HPI    Review of Systems  All other systems reviewed and are negative.      Objective:   Physical Exam        Assessment & Plan:

## 2018-10-17 NOTE — Progress Notes (Signed)
Subjective:   Patient ID: Grace Taylor, female   DOB: 58 y.o.   MRN: [id number]   HPI patient presents stating that she has quite a bit of pain in her right foot over her left foot currently and had history of injury to the foot in 2018 and is been frustrated with her discomfort.  Has had x-rays but no true treatment    Review of Systems  All other systems reviewed and are negative.       Objective:  Physical Exam Vitals signs and nursing note reviewed.  Constitutional:      Appearance: She is well-developed.  Pulmonary:     Effort: Pulmonary effort is normal.  Musculoskeletal: Normal range of motion.  Skin:    General: Skin is warm.  Neurological:     Mental Status: She is alert.     Neurovascular status found to be intact muscle strength was found to be adequate range of motion within normal limits.  Patient was noted to have quite a bit of discomfort in the plantar heel right over left with inflammation of the tendon and also in the lateral peroneal area right over left with no indication of muscle strength loss or other pathology.  Patient's been a good digital perfusion well oriented x3     Assessment:  Appears to be inflammatory condition with difficult to make complete determination with x-rays reviewed today but appears to be some form of plantar fasciitis and possibly peroneal tendinitis     Plan:  H&P conditions reviewed and a bit of focus on the right foot and I did sterile prep injected the fascia right 3 mg Kenalog 5 mg Xylocaine in the peroneal 3 mg Kenalog 5 mg Xylocaine and applied fascial brace to hold the arch in position.  Gave instructions for anti-inflammatories physical therapy and will be seen back again in 4 weeks  X-rays were negative for signs of fracture with small spur formation and no indications of advanced arthritis

## 2018-10-17 NOTE — Patient Instructions (Signed)

## 2018-10-24 ENCOUNTER — Encounter: Payer: Self-pay | Admitting: Podiatry

## 2018-10-24 ENCOUNTER — Other Ambulatory Visit: Payer: Self-pay

## 2018-10-24 ENCOUNTER — Ambulatory Visit: Payer: Medicare HMO | Admitting: Podiatry

## 2018-10-24 DIAGNOSIS — M722 Plantar fascial fibromatosis: Secondary | ICD-10-CM

## 2018-10-24 DIAGNOSIS — B351 Tinea unguium: Secondary | ICD-10-CM

## 2018-10-24 DIAGNOSIS — M7671 Peroneal tendinitis, right leg: Secondary | ICD-10-CM | POA: Diagnosis not present

## 2018-10-24 MED ORDER — TERBINAFINE HCL 250 MG PO TABS: 250.0000 mg | ORAL_TABLET | Freq: Every day | ORAL | 0 refills | Status: DC

## 2018-10-25 LAB — HEPATIC FUNCTION PANEL
AG Ratio: 1.5 (calc) (ref 1.0–2.5)
ALT: 37 U/L — ABNORMAL HIGH (ref 6–29)
AST: 31 U/L (ref 10–35)
Albumin: 4.5 g/dL (ref 3.6–5.1)
Alkaline phosphatase (APISO): 139 U/L (ref 37–153)
Bilirubin, Direct: 0.1 mg/dL (ref 0.0–0.2)
Globulin: 3.1 g/dL (calc) (ref 1.9–3.7)
Indirect Bilirubin: 0.4 mg/dL (calc) (ref 0.2–1.2)
Total Bilirubin: 0.5 mg/dL (ref 0.2–1.2)
Total Protein: 7.6 g/dL (ref 6.1–8.1)

## 2018-10-25 NOTE — Progress Notes (Signed)
Subjective:   Patient ID: Grace Taylor, female   DOB: 58 y.o.   MRN: [id number]   HPI Patient presents stating her foot is still hurting and it feels like the brace is not giving her enough support currently.  Also concerned about the thickness of her nailbeds and the fact that she has family history of fungus and would like any way to try to improve the condition   ROS      Objective:  Physical Exam  Neurovascular status intact with continued discomfort in the ankle subtalar joint left and moderate discomfort in the plantar fascia peroneal complex with thick yellow brittle nailbeds 1-5 both feet     Assessment:  Mycotic nail infection bilateral along with chronic foot pain right with inflammatory component     Plan:  Due to the failure to respond to the first conservative treatments rendered I did go ahead today and recommended immobilization and applied her fracture walker to try to allow for complete reduction of stress on the foot.  Discussed possibility for MRI in future but at this point I do not see any areas that I believe would respond well for surgical intervention and hopeful immobilization will improve.  Discussed antifungal therapy and she is motivated understanding risk and today I placed her on Lamisil 250 mg daily for 90 days and I am sending for liver function studies and gave her all requisition forms to have this done today

## 2018-10-29 NOTE — Progress Notes (Signed)
Should hold off on medicine until alt can be rechecked

## 2018-10-31 ENCOUNTER — Telehealth: Payer: Self-pay | Admitting: *Deleted

## 2018-10-31 NOTE — Telephone Encounter (Signed)
I informed pt of Dr. Mellody Drown review of labs and not to take the lamisil until the ALT was rechecked and lower, to repeat labs in 2 months. Pt stated she thought the labs were normal. I told pt the ALT was 37 and high normal 29. Pt asked the cause and I told her she could have an inflammation of the liver. Pt asked if I knew what caused and she states she takes a lot of vitamin supplements. I told her that could effect and I could send the labs to her PCP. Pt was unsure of her PCP, I reviewed the PCP -General section of her chart and she sees Dr. Claris Pong and to pt I would fax the results there. Pt states that is not her PCP [phone] is her doctor's number, which when I reviewed was Dr. Joycelyn Schmid office number. Pt told me Dr. Daron Offer is her orthopedist, I reviewed and her orthopedist is Dr. Mardelle Matte and informed pt and she states that is her PCP, but she sees a young female that is married to a psychiatrist. I asked pt to call me with the information to fax her labs and I will make sure the PCP would get her labs.

## 2018-10-31 NOTE — Telephone Encounter (Signed)
-----   Message from Wallene Huh, DPM sent at 10/31/2018  8:53 AM EDT ----- 2 months

## 2018-10-31 NOTE — Progress Notes (Signed)
2 months

## 2018-10-31 NOTE — Telephone Encounter (Signed)
Pt states her PCP doctor is Dr. Daron Offer at [facility] fax [phone]. Faxed labs of 10/24/2018 to Dr. Daron Offer.

## 2018-12-14 DIAGNOSIS — M5127 Other intervertebral disc displacement, lumbosacral region: Secondary | ICD-10-CM | POA: Insufficient documentation

## 2018-12-14 DIAGNOSIS — R7989 Other specified abnormal findings of blood chemistry: Secondary | ICD-10-CM | POA: Insufficient documentation

## 2018-12-14 DIAGNOSIS — R945 Abnormal results of liver function studies: Secondary | ICD-10-CM | POA: Insufficient documentation

## 2018-12-14 DIAGNOSIS — M4802 Spinal stenosis, cervical region: Secondary | ICD-10-CM | POA: Insufficient documentation

## 2018-12-15 ENCOUNTER — Other Ambulatory Visit: Payer: Self-pay | Admitting: Podiatry

## 2018-12-25 ENCOUNTER — Ambulatory Visit: Payer: Medicare HMO | Admitting: Orthopedic Surgery

## 2018-12-25 ENCOUNTER — Other Ambulatory Visit: Payer: Self-pay

## 2019-01-10 ENCOUNTER — Encounter: Payer: Self-pay | Admitting: Orthopedic Surgery

## 2019-01-10 ENCOUNTER — Ambulatory Visit: Payer: Medicare HMO | Admitting: Orthopedic Surgery

## 2019-01-10 ENCOUNTER — Other Ambulatory Visit: Payer: Self-pay

## 2019-01-10 DIAGNOSIS — M6701 Short Achilles tendon (acquired), right ankle: Secondary | ICD-10-CM

## 2019-01-10 DIAGNOSIS — M722 Plantar fascial fibromatosis: Secondary | ICD-10-CM

## 2019-01-13 ENCOUNTER — Other Ambulatory Visit: Payer: Self-pay | Admitting: Podiatry

## 2019-01-14 ENCOUNTER — Encounter: Payer: Self-pay | Admitting: Orthopedic Surgery

## 2019-01-14 NOTE — Progress Notes (Signed)
Office Visit Note   Patient: Grace Taylor           Date of Birth: [birth year]           MRN: [id number] Visit Date: 01/10/2019              Requested by: No referring provider defined for this encounter. PCP: Patient, No Pcp Per  Chief Complaint  Patient presents with  . Right Ankle - Pain, Follow-up      HPI: Patient is a 59 year old woman who is seen for initial evaluation for right foot and ankle pain.  She states she had a crush injury at work years ago.  She states she has been to 5 orthopedic doctors and a chiropractor.  Patient states she also struck her leg on a pallet jack while working at the [employer].  She also complains of chronic cervical and lumbar spine pain.  Primarily she states that her biggest problem is with the ankle and foot on the right.  Assessment & Plan: Visit Diagnoses:  1. Acquired contracture of Achilles tendon, right   2. Plantar fasciitis of right foot     Plan: Discussed with the patient the treatment for the Achilles tendinitis and plantar fasciitis is Achilles stretching this was demonstrated to her also to wear a stiff soled sneaker with orthotics to protect the plantar fascia as well as toe raises to strengthen the plantar fascia.  Follow-Up Instructions: Return if symptoms worsen or fail to improve.   Ortho Exam  Patient is alert, oriented, no adenopathy, well-dressed, normal affect, normal respiratory effort. Examination patient has a good dorsalis pedis pulse she has a negative straight leg raise bilaterally with good motor strength in both lower extremities no evidence of sciatic tension sign.  She does have Achilles contracture with dorsiflexion of the ankle only to neutral with her knee extended she has pain to palpation over the plantar fascia not just the origin of the plantar fascia but along the entire plantar fascia.  There are no nodular changes.  Imaging: No results found. No images are attached to the  encounter.  Labs: Lab Results  Component Value Date   HGBA1C  04/04/2009    5.5 (NOTE) The ADA recommends the following therapeutic goal for glycemic control related to Hgb A1c measurement: Goal of therapy: <6.5 Hgb A1c  Reference: American Diabetes Association: Clinical Practice Recommendations 2010, Diabetes Care, 2010, 33: (Suppl  1).   REPTSTATUS 04/05/2009 FINAL 04/04/2009   CULT NO GROWTH 04/04/2009     Lab Results  Component Value Date   ALBUMIN 2.8 (L) 04/09/2009   ALBUMIN 2.7 (L) 04/08/2009   ALBUMIN 3.0 (L) 04/07/2009    Lab Results  Component Value Date   MG 1.9 04/05/2009   No results found for: VD25OH  No results found for: PREALBUMIN CBC EXTENDED Latest Ref Rng & Units 08/10/2016 08/26/2009 04/09/2009  WBC - 6.3 8.1 6.1  RBC 3.87 - 5.11 M/uL - 4.43 3.54(L)  HGB 12.0 - 16.0 13.3 13.4 11.0(L)  HCT 36 - 46 38 41.8 32.3(L)  PLT 150 - 399 209 286 189  NEUTROABS 1.7 - 7.7 K/uL - 3.6 -  LYMPHSABS 0.7 - 4.0 K/uL - 3.6 -     There is no height or weight on file to calculate BMI.  Orders:  No orders of the defined types were placed in this encounter.  No orders of the defined types were placed in this encounter.    Procedures: No procedures  performed  Clinical Data: No additional findings.  ROS:  All other systems negative, except as noted in the HPI. Review of Systems  Objective: Vital Signs: There were no vitals taken for this visit.  Specialty Comments:  No specialty comments available.  PMFS History: Patient Active Problem List   Diagnosis Date Noted  . Nontraumatic incomplete tear of left rotator cuff 06/01/2018  . Biceps tendinitis of left shoulder 03/01/2018  . Osteoarthritis of right subtalar joint 03/01/2018  . PTSD (post-traumatic stress disorder) 04/07/2017  . Irritable bowel syndrome with diarrhea 04/07/2017  . Benign colon polyp 04/07/2017  . History of Clostridium difficile colitis 04/07/2017  . History of peptic ulcer 04/07/2017   . Chronic neck pain - post injury, worker's comp 2013, 2018 04/07/2017  . PVC's (premature ventricular contractions) 10/11/2016  . Dyslipidemia 10/11/2016   Past Medical History:  Diagnosis Date  . Allergy   . Anemia   . Benign colon polyp 04/07/2017   Q 5 year recall  . Chronic neck pain - post injury, worker's comp 2013, 2018 04/07/2017   Dr. Mardelle Matte  . Clostridium difficile infection   . History of peptic ulcer 04/07/2017  . Hyperlipidemia   . IBS (irritable bowel syndrome) 04/07/2017   Colonoscopy by Carlean Purl  . Migraines   . PTSD (post-traumatic stress disorder) 04/07/2017  . Ulcer    age 40    Family History  Problem Relation Age of Onset  . Hypertension Mother   . Alcohol abuse Mother   . Arthritis Mother   . Cancer Mother   . Heart disease Mother   . Kidney disease Mother   . Mental illness Mother   . Hypertension Father   . CAD Father 39       CABG   . Hyperlipidemia Father   . Heart disease Father   . Early death Father   . Heart attack Father   . Hypertension Brother   . Alcohol abuse Brother   . Heart disease Brother   . Breast cancer Maternal Aunt   . Cancer Maternal Aunt        breast cancer  . Hypertension Sister   . Hyperlipidemia Sister   . Arthritis Sister     Past Surgical History:  Procedure Laterality Date  . CHOLECYSTECTOMY  2012  . COLONOSCOPY  2014  . DG GALL BLADDER     2012  . ESOPHAGOGASTRODUODENOSCOPY  2014  . FLEXIBLE SIGMOIDOSCOPY  03/2014  . OVARIAN CYST REMOVAL    . right shoulder surgery    . TMJ ARTHROSCOPY     Social History   Occupational History  . Occupation: former Control and instrumentation engineer  . Occupation: disabled  Tobacco Use  . Smoking status: Never Smoker  . Smokeless tobacco: Never Used  Substance and Sexual Activity  . Alcohol use: Yes    Comment: occasional  . Drug use: No  . Sexual activity: Not Currently

## 2019-01-19 ENCOUNTER — Other Ambulatory Visit: Payer: Self-pay | Admitting: Podiatry

## 2019-01-30 ENCOUNTER — Other Ambulatory Visit: Payer: Self-pay | Admitting: Family Medicine

## 2019-01-30 DIAGNOSIS — Z1231 Encounter for screening mammogram for malignant neoplasm of breast: Secondary | ICD-10-CM

## 2019-02-10 ENCOUNTER — Other Ambulatory Visit: Payer: Self-pay | Admitting: Podiatry

## 2019-03-08 ENCOUNTER — Other Ambulatory Visit: Payer: Self-pay

## 2019-03-08 ENCOUNTER — Ambulatory Visit
Admission: RE | Admit: 2019-03-08 | Discharge: 2019-03-08 | Disposition: A | Discharge status: Home / Self Care | Payer: Medicare HMO | Source: Ambulatory Visit | Attending: Family Medicine | Admitting: Family Medicine

## 2019-03-08 DIAGNOSIS — Z1231 Encounter for screening mammogram for malignant neoplasm of breast: Secondary | ICD-10-CM

## 2019-04-03 DIAGNOSIS — S62664A Nondisplaced fracture of distal phalanx of right ring finger, initial encounter for closed fracture: Secondary | ICD-10-CM | POA: Insufficient documentation

## 2019-04-04 DIAGNOSIS — G510 Bell's palsy: Secondary | ICD-10-CM | POA: Insufficient documentation

## 2019-05-01 ENCOUNTER — Ambulatory Visit: Payer: Medicare HMO | Admitting: Orthopaedic Surgery

## 2019-05-01 ENCOUNTER — Encounter: Payer: Self-pay | Admitting: Orthopaedic Surgery

## 2019-05-01 ENCOUNTER — Ambulatory Visit: Payer: Self-pay

## 2019-05-01 ENCOUNTER — Other Ambulatory Visit: Payer: Self-pay

## 2019-05-01 VITALS — BP 132/86 | HR 90 | Ht 66.0 in | Wt 162.0 lb

## 2019-05-01 DIAGNOSIS — M542 Cervicalgia: Secondary | ICD-10-CM

## 2019-05-01 NOTE — Progress Notes (Signed)
Office Visit Note   Patient: Grace Taylor           Date of Birth: 1960/11/09           MRN: [id number] Visit Date: 05/01/2019              Requested by: No referring provider defined for this encounter. PCP: Patient, No Pcp Per   Assessment & Plan: Visit Diagnoses:  1. Neck pain     Plan: Patient has cervical spondylosis.  She also has problems with her knees and back as well but her neck is much more symptomatic and she rates that it is severe.  Plain radiographs were reviewed with her today which showed spondylosis C5-6 C6-7.  Obtain a cervical MRI scan see her back after scan for review.  Follow-Up Instructions: Return after cervical MRI scan declined  Orders:  Orders Placed This Encounter  Procedures  . XR Cervical Spine 2 or 3 views   No orders of the defined types were placed in this encounter.     Procedures: No procedures performed   Clinical Data: No additional findings.   Subjective: Chief Complaint  Patient presents with  . Neck - Pain  . Lower Back - Pain  . Right Leg - Pain  . Left Leg - Pain    HPI 59 year old female here for evaluation of neck pain after seeing Dr. Daryll Brod.  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  She states her right foot was stuck and she has had trouble putting a lot of weight on her foot.  She describes that she had problems with pain across her chest pain in her neck.  She describes in detail past problems including a injury sacroiliac joint treated by Dr. Dortha Kern in 1993 problems with her shoulder 2000 on the right side.  She states currently she has some back pains not able to sit long does better she is moving around does not describe classic claudication symptoms.  She has limitation of rotation of her neck only 25 to 50% and states she has sharp pain.  She states she is unable to lift more than 5 to 10 pounds with her left arm due to the  pain.  She has numbness in her hands and fingers.  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  Patient currently has a splint on for nondisplaced fracture distal phalanx of the right ring finger followed by Dr. Fredna Dow.  Review of Systems posterior history of PTSD, PVCs history of C. difficile not symptomatic.  Chronic neck pain presents Worker's Comp. injuries 2013 2018.  Left shoulder rotator cuff tendinopathy.  Otherwise negative is obtains HPI.   Objective: Vital Signs: BP 132/86   Pulse 90   Ht 5\' 6"  (1.676 m)   Wt 162 lb (73.5 kg)   BMI 26.15 kg/m   Physical Exam Constitutional:      Appearance: She is well-developed.  HENT:     Head: Normocephalic.     Right Ear: External ear normal.     Left Ear: External ear normal.  Eyes:     Pupils: Pupils are equal, round, and reactive to light.  Neck:     Thyroid: No thyromegaly.     Trachea: No tracheal deviation.  Cardiovascular:     Rate and Rhythm: Normal rate.  Pulmonary:     Effort: Pulmonary effort is normal.  Abdominal:  Palpations: Abdomen is soft.  Skin:    General: Skin is warm and dry.  Neurological:     Mental Status: She is alert and oriented to person, place, and time.  Psychiatric:        Behavior: Behavior normal.     Ortho Exam patient has bilateral brachial plexus tenderness positive Spurling both right and left.  Upper extremity reflexes are symmetrical.  She has decreased sensation right hand more so on the radial than ulnar side.  No interossei atrophy.  Mild discomfort with impingement right left shoulder negative drop arm test.  Specialty Comments:  No specialty comments available.  Imaging: No results found.   PMFS History: Patient Active Problem List   Diagnosis Date Noted  . Nontraumatic incomplete tear of left rotator cuff 06/01/2018  . Biceps tendinitis of left shoulder 03/01/2018  . Osteoarthritis of right subtalar joint 03/01/2018  . PTSD (post-traumatic  stress disorder) 04/07/2017  . Irritable bowel syndrome with diarrhea 04/07/2017  . Benign colon polyp 04/07/2017  . History of Clostridium difficile colitis 04/07/2017  . History of peptic ulcer 04/07/2017  . Chronic neck pain - post injury, worker's comp 2013, 2018 04/07/2017  . PVC's (premature ventricular contractions) 10/11/2016  . Dyslipidemia 10/11/2016   Past Medical History:  Diagnosis Date  . Allergy   . Anemia   . Benign colon polyp 04/07/2017   Q 5 year recall  . Chronic neck pain - post injury, worker's comp 2013, 2018 04/07/2017   Dr. Mardelle Matte  . Clostridium difficile infection   . History of peptic ulcer 04/07/2017  . Hyperlipidemia   . IBS (irritable bowel syndrome) 04/07/2017   Colonoscopy by Carlean Purl  . Migraines   . PTSD (post-traumatic stress disorder) 04/07/2017  . Ulcer    age 37    Family History  Problem Relation Age of Onset  . Hypertension Mother   . Alcohol abuse Mother   . Arthritis Mother   . Cancer Mother   . Heart disease Mother   . Kidney disease Mother   . Mental illness Mother   . Hypertension Father   . CAD Father 71       CABG   . Hyperlipidemia Father   . Heart disease Father   . Early death Father   . Heart attack Father   . Hypertension Brother   . Alcohol abuse Brother   . Heart disease Brother   . Breast cancer Maternal Aunt   . Cancer Maternal Aunt        breast cancer  . Hypertension Sister   . Hyperlipidemia Sister   . Arthritis Sister     Past Surgical History:  Procedure Laterality Date  . CHOLECYSTECTOMY  2012  . COLONOSCOPY  2014  . DG GALL BLADDER     2012  . ESOPHAGOGASTRODUODENOSCOPY  2014  . FLEXIBLE SIGMOIDOSCOPY  03/2014  . OVARIAN CYST REMOVAL    . right shoulder surgery    . TMJ ARTHROSCOPY     Social History   Occupational History  . Occupation: former Control and instrumentation engineer  . Occupation: disabled  Tobacco Use  . Smoking status: Never Smoker  . Smokeless tobacco: Never Used  Substance and Sexual Activity  .  Alcohol use: Yes    Comment: occasional  . Drug use: No  . Sexual activity: Not Currently

## 2019-06-04 DIAGNOSIS — H02402 Unspecified ptosis of left eyelid: Secondary | ICD-10-CM | POA: Insufficient documentation

## 2019-06-09 DIAGNOSIS — F800 Phonological disorder: Secondary | ICD-10-CM | POA: Insufficient documentation

## 2019-06-09 DIAGNOSIS — Z7189 Other specified counseling: Secondary | ICD-10-CM | POA: Insufficient documentation

## 2019-06-09 NOTE — Progress Notes (Deleted)
Cardiology Office Note   Date:  06/09/2019   ID:  Grace Taylor, DOB 10-05-1960, MRN [id number]  PCP:  Patient, No Pcp Per  Cardiologist:   Minus Breeding, MD    No chief complaint on file.     History of Present Illness: Grace Taylor is a 59 y.o. female who presented in 2018 for evaluation of strong family history, dizziness, episodes of shortness of breath and some chest discomfort.   She had a negative POET (Plain Old Exercise Treadmill).  ***   ****he has occasional sharp stabbing chest pain. This is sporadic. She has episodes where she feels like she stops breathing and that her heart might be skipping. She's not had any prior cardiac workup or history. She does have a family history of her father having heart disease age. She doesn't exercise routinely though she's on her feet working at the [employer]. She does have dizziness which she is convinced is related to Crestor. This dose was doubled in the past year and she was told to take it at night as she thought it was causing some dizziness. She thinks interferes with her sleep. She's under stress at work and also her mother has been very ill.     Past Medical History:  Diagnosis Date  . Allergy   . Anemia   . Benign colon polyp 04/07/2017   Q 5 year recall  . Chronic neck pain - post injury, worker's comp 2013, 2018 04/07/2017   Dr. Mardelle Matte  . Clostridium difficile infection   . History of peptic ulcer 04/07/2017  . Hyperlipidemia   . IBS (irritable bowel syndrome) 04/07/2017   Colonoscopy by Carlean Purl  . Migraines   . PTSD (post-traumatic stress disorder) 04/07/2017  . Ulcer    age 42    Past Surgical History:  Procedure Laterality Date  . CHOLECYSTECTOMY  2012  . COLONOSCOPY  2014  . DG GALL BLADDER     2012  . ESOPHAGOGASTRODUODENOSCOPY  2014  . FLEXIBLE SIGMOIDOSCOPY  03/2014  . OVARIAN CYST REMOVAL    . right shoulder surgery    . TMJ ARTHROSCOPY       Current Outpatient Medications    Medication Sig Dispense Refill  . clonazePAM (KLONOPIN) 0.5 MG tablet Take 1 tablet by mouth 2 (two) times daily.  1  . diphenhydrAMINE (BENADRYL) 25 MG tablet Take 25 mg by mouth as needed.    . fluticasone (FLONASE) 50 MCG/ACT nasal spray Place 2 sprays into both nostrils daily.     Marland Kitchen loratadine-pseudoephedrine (CLARITIN-D 24-HOUR) 10-240 MG per 24 hr tablet Take 1 tablet by mouth daily.    Marland Kitchen lovastatin (MEVACOR) 40 MG tablet Take 40 mg by mouth at bedtime.    . Multiple Vitamin (MULITIVITAMIN WITH MINERALS) TABS Take 1 tablet by mouth daily.    . phentermine (ADIPEX-P) 37.5 MG tablet Take 18.75-37.5 mg by mouth daily.    Marland Kitchen terbinafine (LAMISIL) 250 MG tablet Take 1 tablet (250 mg total) by mouth daily. 90 tablet 0  . topiramate (TOPAMAX) 25 MG tablet Take 25 mg by mouth daily.    Marland Kitchen zolpidem (AMBIEN) 10 MG tablet Take 10 mg by mouth at bedtime.      No current facility-administered medications for this visit.    Allergies:   Codeine, Penicillins, and Sulfonamide derivatives    ROS:  Please see the history of present illness.   Otherwise, review of systems are positive for **.   All other systems  are reviewed and negative.    PHYSICAL EXAM: VS:  There were no vitals taken for this visit. , BMI There is no height or weight on file to calculate BMI. GENERAL:  Well appearing NECK:  No jugular venous distention, waveform within normal limits, carotid upstroke brisk and symmetric, no bruits, no thyromegaly LUNGS:  Clear to auscultation bilaterally CHEST:  Unremarkable HEART:  PMI not displaced or sustained,S1 and S2 within normal limits, no S3, no S4, no clicks, no rubs, *** murmurs ABD:  Flat, positive bowel sounds normal in frequency in pitch, no bruits, no rebound, no guarding, no midline pulsatile mass, no hepatomegaly, no splenomegaly EXT:  2 plus pulses throughout, no edema, no cyanosis no clubbing    ***GENERAL:  Well appearing HEENT:  Pupils equal round and reactive, fundi not  visualized, oral mucosa unremarkable NECK:  No jugular venous distention, waveform within normal limits, carotid upstroke brisk and symmetric, no bruits, no thyromegaly LYMPHATICS:  No cervical, inguinal adenopathy LUNGS:  Clear to auscultation bilaterally BACK:  No CVA tenderness CHEST:  Unremarkable HEART:  PMI not displaced or sustained,S1 and S2 within normal limits, no S3, no S4, no clicks, no rubs, no murmurs ABD:  Flat, positive bowel sounds normal in frequency in pitch, no bruits, no rebound, no guarding, no midline pulsatile mass, no hepatomegaly, no splenomegaly EXT:  2 plus pulses throughout, no edema, no cyanosis no clubbing SKIN:  No rashes no nodules NEURO:  Cranial nerves II through XII grossly intact, motor grossly intact throughout PSYCH:  Cognitively intact, oriented to person place and time    EKG:  EKG is *** ordered today. The ekg ordered today demonstrates sinus rhythm, rate *** axis within normal limits, intervals within normal limits, no acute ST-T wave changes, premature ventricular contractions.   Recent Labs: 10/24/2018: ALT 37    Lipid Panel    Wt Readings from Last 3 Encounters:  05/01/19 162 lb (73.5 kg)  04/07/17 220 lb 9.6 oz (100.1 kg)  10/10/16 224 lb (101.6 kg)      Other studies Reviewed: Additional studies/ records that were reviewed today include: ***. Review of the above records demonstrates:  Please see elsewhere in the note.     ASSESSMENT AND PLAN:  CHEST PAIN:   ***  She has some atypical pain but she does have risk factors.  I will bring the patient back for a POET (Plain Old Exercise Test). This will allow me to screen for obstructive coronary disease, risk stratify and very importantly provide a prescription for exercise.  DYSLIPIDEMIA:   ***  I was able to retrieve some blood work and her LDL recently was 122 with an HDL of 43. I'm going to switch her to Lipitor 40 mg daily since she doesn't think she tolerates the Crestor.  Eventually I would like to consider getting a coronary calcium score to further guide the need for an targets for therapy.  As it stands her risk score is 2% by [place] but this does not consider family history.  Score is 3.3% by AHA.  Coronary calcium could add to this risk predictor via MESA.    PVCs. ***   I don't think she has symptoms with this but I will look for any inducible arrhythmias on POET (Plain Old Exercise Treadmill).    COVID EDUCATION:  ***   Current medicines are reviewed at length with the patient today.  The patient does not have concerns regarding medicines.  The following changes have been made:  ***  Labs/ tests ordered today include: ***  No orders of the defined types were placed in this encounter.    Disposition:   FU with ***   Signed, Minus Breeding, MD  06/09/2019 11:23 AM    [facility]

## 2019-06-10 ENCOUNTER — Ambulatory Visit: Payer: Medicare HMO | Admitting: Cardiology

## 2019-06-10 ENCOUNTER — Other Ambulatory Visit: Payer: Self-pay

## 2019-06-10 ENCOUNTER — Encounter: Payer: Self-pay | Admitting: Cardiology

## 2019-06-10 VITALS — BP 118/80 | HR 83 | Temp 97.4°F | Ht 66.0 in | Wt 169.0 lb

## 2019-06-10 DIAGNOSIS — R072 Precordial pain: Secondary | ICD-10-CM | POA: Diagnosis not present

## 2019-06-10 DIAGNOSIS — M79661 Pain in right lower leg: Secondary | ICD-10-CM

## 2019-06-10 DIAGNOSIS — Z7189 Other specified counseling: Secondary | ICD-10-CM

## 2019-06-10 DIAGNOSIS — I493 Ventricular premature depolarization: Secondary | ICD-10-CM | POA: Diagnosis not present

## 2019-06-10 DIAGNOSIS — M79662 Pain in left lower leg: Secondary | ICD-10-CM

## 2019-06-10 DIAGNOSIS — F800 Phonological disorder: Secondary | ICD-10-CM

## 2019-06-10 NOTE — Patient Instructions (Signed)
Medication Instructions:  NO CHANGES *If you need a refill on your cardiac medications before your next appointment, please call your pharmacy*  Lab Work: NONE ORDERED THIS VISIT  Testing/Procedures: Your physician has requested that you have a lower extremity venous duplex. This test is an ultrasound of the veins in the legs. It looks at venous blood flow that carries blood from the heart to the legs. Allow one hour for a Lower Venous exam. Allow thirty minutes for an Upper Venous exam. There are no restrictions or special instructions.  Follow-Up: At [facility], you and your health needs are our priority.  As part of our continuing mission to provide you with exceptional heart care, we have created designated Provider Care Teams.  These Care Teams include your primary Cardiologist (physician) and Advanced Practice Providers (APPs -  Physician Assistants and Nurse Practitioners) who all work together to provide you with the care you need, when you need it.  Your next appointment:   12 month(s)  You will receive a reminder letter in the mail two months in advance. If you don't receive a letter, please call our office to schedule the follow-up appointment.  Return sooner if needed  The format for your next appointment:   In Person  Provider:   Minus Breeding, MD

## 2019-06-10 NOTE — Progress Notes (Signed)
Cardiology Office Note   Date:  06/10/2019   ID:  Grace Taylor, DOB 10/04/1960, MRN [id number]  PCP:  Nickola Major, MD  Cardiologist:   Minus Breeding, MD    Chief Complaint  Patient presents with  . Leg Swelling      History of Present Illness: Grace Taylor is a 59 y.o. female who presented in 2018 for evaluation of strong family history, dizziness, episodes of shortness of breath and some chest discomfort.   She had a negative POET (Plain Old Exercise Treadmill).    She has had lots of problems since [place]  She has lots of chest pains and neck and back problems.  She has some sharp pains down her legs as well.  I looked through some of her MRIs and she has been to multiple spine and Ortho specialist.  I looked at some [facility] MRIs and she has lots of mentions of degeneration and inflammation.  She is not had any acute cardiac problems.  She is complaining of pain in her legs.  She has swelling in her legs and superficial veins that become enlarged.  She has shooting pains down into her feet that she thinks comes from her spine and back.  She is not describing any new shortness of breath, PND or orthopnea.  She is not having any new complications, presyncope or syncope.  She does have sharp pains throughout her chest since her accident.  None of this is different than when she had her treadmill in 2018.   Past Medical History:  Diagnosis Date  . Allergy   . Anemia   . Benign colon polyp 04/07/2017   Q 5 year recall  . Chronic neck pain - post injury, worker's comp 2013, 2018 04/07/2017   Dr. Mardelle Matte  . Clostridium difficile infection   . History of peptic ulcer 04/07/2017  . Hyperlipidemia   . IBS (irritable bowel syndrome) 04/07/2017   Colonoscopy by Carlean Purl  . Migraines   . PTSD (post-traumatic stress disorder) 04/07/2017  . Ulcer    age 49    Past Surgical History:  Procedure Laterality Date  .  CHOLECYSTECTOMY  2012  . COLONOSCOPY  2014  . DG GALL BLADDER     2012  . ESOPHAGOGASTRODUODENOSCOPY  2014  . FLEXIBLE SIGMOIDOSCOPY  03/2014  . OVARIAN CYST REMOVAL    . right shoulder surgery    . TMJ ARTHROSCOPY       Current Outpatient Medications  Medication Sig Dispense Refill  . buPROPion (WELLBUTRIN XL) 150 MG 24 hr tablet     . clonazePAM (KLONOPIN) 0.5 MG tablet Take 1 tablet by mouth 2 (two) times daily.  1  . diphenhydrAMINE (BENADRYL) 25 MG tablet Take 25 mg by mouth as needed.    . fluticasone (FLONASE) 50 MCG/ACT nasal spray Place 2 sprays into both nostrils daily.     . Multiple Vitamin (MULITIVITAMIN WITH MINERALS) TABS Take 1 tablet by mouth daily.    . phentermine (ADIPEX-P) 37.5 MG tablet Take 18.75-37.5 mg by mouth daily.    Marland Kitchen zolpidem (AMBIEN) 10 MG tablet Take 10 mg by mouth at bedtime.      No current facility-administered medications for this visit.    Allergies:   Codeine, Penicillins, and Sulfonamide derivatives    ROS:  Please see the history of present illness.   Otherwise, review of systems are positive for **.   All other  systems are reviewed and negative.    PHYSICAL EXAM: VS:  BP 118/80   Pulse 83   Temp (!) 97.4 F (36.3 C)   Ht 5\' 6"  (1.676 m)   Wt 169 lb (76.7 kg)   SpO2 99%   BMI 27.28 kg/m  , BMI Body mass index is 27.28 kg/m. GENERAL:  Well appearing NECK:  No jugular venous distention, waveform within normal limits, carotid upstroke brisk and symmetric, no bruits, no thyromegaly LUNGS:  Clear to auscultation bilaterally CHEST:  Unremarkable HEART:  PMI not displaced or sustained,S1 and S2 within normal limits, no S3, no S4, no clicks, no rubs, no murmurs ABD:  Flat, positive bowel sounds normal in frequency in pitch, no bruits, no rebound, no guarding, no midline pulsatile mass, no hepatomegaly, no splenomegaly EXT:  2 plus pulses throughout, trace edema, no cyanosis no clubbing  EKG:  EKG is  ordered today. The ekg ordered  today demonstrates sinus rhythm, rate 83 axis within normal limits, intervals within normal limits, no acute ST-T wave changes.   Recent Labs: 10/24/2018: ALT 37    Lipid Panel    Wt Readings from Last 3 Encounters:  06/10/19 169 lb (76.7 kg)  05/01/19 162 lb (73.5 kg)  04/07/17 220 lb 9.6 oz (100.1 kg)      Other studies Reviewed: Additional studies/ records that were reviewed today include: Care Everywhere MRI and orthopedic records. Review of the above records demonstrates:  Please see elsewhere in the note.     ASSESSMENT AND PLAN:  CHEST PAIN:   Her chest pain is very atypical and she had a negative POET (Plain Old Exercise Treadmill) 3 years ago.  She does not have strong risk factors for a cardiac etiology.  I suspect her discomfort is musculoskeletal and no further cardiac work-up is suggested.   LEG SWELLING: I will check venous Dopplers from the outside chance that she has lower extremity DVTs and also to look for venous competence.  Management will be based on these results.   Current medicines are reviewed at length with the patient today.  The patient does not have concerns regarding medicines.  The following changes have been made:  None  Labs/ tests ordered today include:   Orders Placed This Encounter  Procedures  . EKG 12-Lead  . VAS [place] LOWER EXTREMITY VENOUS (DVT)     Disposition:   FU with me in one year or sooner if needed.    Signed, Minus Breeding, MD  06/10/2019 9:36 AM    [facility]

## 2019-06-13 ENCOUNTER — Other Ambulatory Visit: Payer: Self-pay

## 2019-06-13 ENCOUNTER — Ambulatory Visit (HOSPITAL_COMMUNITY)
Admission: RE | Admit: 2019-06-13 | Discharge: 2019-06-13 | Disposition: A | Discharge status: Home / Self Care | Payer: Medicare HMO | Source: Ambulatory Visit | Attending: Internal Medicine | Admitting: Internal Medicine

## 2019-06-13 DIAGNOSIS — M79661 Pain in right lower leg: Secondary | ICD-10-CM | POA: Insufficient documentation

## 2019-06-13 DIAGNOSIS — M79662 Pain in left lower leg: Secondary | ICD-10-CM | POA: Diagnosis present

## 2019-06-19 ENCOUNTER — Encounter: Payer: Self-pay | Admitting: Orthopaedic Surgery

## 2019-06-20 DIAGNOSIS — F411 Generalized anxiety disorder: Secondary | ICD-10-CM | POA: Insufficient documentation

## 2019-07-05 ENCOUNTER — Other Ambulatory Visit: Payer: Self-pay

## 2019-07-05 ENCOUNTER — Ambulatory Visit (INDEPENDENT_AMBULATORY_CARE_PROVIDER_SITE_OTHER): Payer: Medicare HMO | Admitting: Sports Medicine

## 2019-07-05 DIAGNOSIS — M722 Plantar fascial fibromatosis: Secondary | ICD-10-CM | POA: Diagnosis not present

## 2019-07-05 DIAGNOSIS — M79671 Pain in right foot: Secondary | ICD-10-CM

## 2019-07-05 DIAGNOSIS — M48061 Spinal stenosis, lumbar region without neurogenic claudication: Secondary | ICD-10-CM

## 2019-07-05 NOTE — Progress Notes (Signed)
    Procedures performed today:    None.  Independent interpretation of notes and tests performed by another provider:   [place] MRI shows thickening of the ATFL but nothing else.  Lumbar spine MRI shows multilevel spondylosis, spinal stenosis, multifactorial, worst at the L3-L4 level.  Brief History, Exam, Impression, and Recommendations:    Plantar fasciitis, right This 59 year old female has notably chronic pain on the plantar aspect of her right heel, she has severe pain with palpation. We are going to start with air heel brace, formal physical therapy, she wants to avoid NSAIDs and oral medications for now which is acceptable. Referral for custom molded orthotics. She will avoid barefoot walking, and return to see me in 1 month, we will consider a night splint +/- injection if no better. She does have some pain along her forefoot, hindfoot MRI was not particularly helpful, we are going to add a forefoot MRI to complete the work-up.  Lumbar spinal stenosis She also has chronic back pain, buttock pain, she was concerned that she had a crack in her hips or her spine/sacrum, I advised her that imaging did not reveal any of these and she was relieved. MRI did however reveal L3-4 lumbar spinal stenosis, moderate, and I advised her that this could cause some of the shooting pains and stinging going down her leg. She understands and is agreeable to do some physical therapy. Certainly medications could be used in the future, but we will broach that topic when we get there.    ___________________________________________ Gwen Her. Dianah Field, M.D., ABFM., CAQSM. Primary Care and [place] Instructor of [place] of [facility] of [facility]

## 2019-07-05 NOTE — Assessment & Plan Note (Signed)
This 59 year old female has notably chronic pain on the plantar aspect of her right heel, she has severe pain with palpation. We are going to start with air heel brace, formal physical therapy, she wants to avoid NSAIDs and oral medications for now which is acceptable. Referral for custom molded orthotics. She will avoid barefoot walking, and return to see me in 1 month, we will consider a night splint +/- injection if no better. She does have some pain along her forefoot, hindfoot MRI was not particularly helpful, we are going to add a forefoot MRI to complete the work-up.

## 2019-07-05 NOTE — Assessment & Plan Note (Addendum)
She also has chronic back pain, buttock pain, she was concerned that she had a crack in her hips or her spine/sacrum, I advised her that imaging did not reveal any of these and she was relieved. MRI did however reveal L3-4 lumbar spinal stenosis, moderate, and I advised her that this could cause some of the shooting pains and stinging going down her leg. She understands and is agreeable to do some physical therapy. Certainly medications could be used in the future, but we will broach that topic when we get there.

## 2019-07-11 ENCOUNTER — Other Ambulatory Visit: Payer: Self-pay

## 2019-07-11 ENCOUNTER — Ambulatory Visit: Payer: Medicare HMO | Admitting: Family Medicine

## 2019-07-11 DIAGNOSIS — M722 Plantar fascial fibromatosis: Secondary | ICD-10-CM | POA: Diagnosis not present

## 2019-07-11 NOTE — Patient Instructions (Signed)
Nice to meet you Please try the insoles Please follow up with Dr. Darene Lamer   Please send me a message in [place] with any questions or updates. .   --Dr. Raeford Razor

## 2019-07-11 NOTE — Assessment & Plan Note (Signed)
Acute on chronic in nature.  Has tried several measures thus far.  Has tried insoles and orthotics in the past. -Counseled on home exercise therapy and supportive care. -Discussed orthotics and will hold off for now.  She could consider adding scaphoid pads.

## 2019-07-11 NOTE — Progress Notes (Signed)
Grace Taylor - 59 y.o. female MRN [id number]  Date of birth: 1960/06/24  SUBJECTIVE:  Including CC & ROS.  No chief complaint on file.   Grace Taylor is a 59 y.o. female that is presenting with right heel pain.  This is acute on chronic issue.  She has tried several measures thus far and had limited improvement.  It seems more of a constant process.  Has tried insoles and orthotics in the past.   Review of Systems See HPI   HISTORY: Past Medical, Surgical, Social, and Family History Reviewed & Updated per EMR.   Pertinent Historical Findings include:  Past Medical History:  Diagnosis Date  . Allergy   . Anemia   . Benign colon polyp 04/07/2017   Q 5 year recall  . Chronic neck pain - post injury, worker's comp 2013, 2018 04/07/2017   Dr. Mardelle Matte  . Clostridium difficile infection   . History of peptic ulcer 04/07/2017  . Hyperlipidemia   . IBS (irritable bowel syndrome) 04/07/2017   Colonoscopy by Carlean Purl  . Migraines   . PTSD (post-traumatic stress disorder) 04/07/2017  . Ulcer    age 17    Past Surgical History:  Procedure Laterality Date  . CHOLECYSTECTOMY  2012  . COLONOSCOPY  2014  . DG GALL BLADDER     2012  . ESOPHAGOGASTRODUODENOSCOPY  2014  . FLEXIBLE SIGMOIDOSCOPY  03/2014  . OVARIAN CYST REMOVAL    . right shoulder surgery    . TMJ ARTHROSCOPY      Family History  Problem Relation Age of Onset  . Hypertension Mother   . Alcohol abuse Mother   . Arthritis Mother   . Cancer Mother   . Heart disease Mother   . Kidney disease Mother   . Mental illness Mother   . Hypertension Father   . CAD Father 67       CABG   . Hyperlipidemia Father   . Heart disease Father   . Early death Father   . Heart attack Father   . Hypertension Brother   . Alcohol abuse Brother   . Heart disease Brother   . Breast cancer Maternal Aunt   . Cancer Maternal Aunt        breast cancer  . Hypertension Sister   . Hyperlipidemia Sister   . Arthritis Sister       Social History   Socioeconomic History  . Marital status: Divorced    Spouse name: Not on file  . Number of children: 0  . Years of education: some college  . Highest education level: Not on file  Occupational History  . Occupation: former Control and instrumentation engineer  . Occupation: disabled  Tobacco Use  . Smoking status: Never Smoker  . Smokeless tobacco: Never Used  Vaping Use  . Vaping Use: Never used  Substance and Sexual Activity  . Alcohol use: Yes    Comment: occasional  . Drug use: No  . Sexual activity: Not Currently  Other Topics Concern  . Not on file  Social History Narrative   Worked at the [employer], on [employer] or SSI - but no health insurance   Social Determinants of Health   Financial Resource Strain:   . Difficulty of Paying Living Expenses:   Food Insecurity:   . Worried About Charity fundraiser in the Last Year:   . Arboriculturist in the Last Year:   Transportation Needs:   . Film/video editor (Medical):   Marland Kitchen  Lack of Transportation (Non-Medical):   Physical Activity:   . Days of Exercise per Week:   . Minutes of Exercise per Session:   Stress:   . Feeling of Stress :   Social Connections:   . Frequency of Communication with Friends and Family:   . Frequency of Social Gatherings with Friends and Family:   . Attends Religious Services:   . Active Member of Clubs or Organizations:   . Attends Archivist Meetings:   Marland Kitchen Marital Status:   Intimate Partner Violence:   . Fear of Current or Ex-Partner:   . Emotionally Abused:   Marland Kitchen Physically Abused:   . Sexually Abused:      PHYSICAL EXAM:  VS: BP 112/78   Ht 5\' 6"  (1.676 m)   Wt 165 lb (74.8 kg)   BMI 26.63 kg/m  Physical Exam Gen: NAD, alert, cooperative with exam, well-appearing MSK:  Right ankle and foot: No redness or swelling. Normal range of motion. Normal strength resistance. Some loss of the transverse arch. Some hammertoe in and corns present. Neurovascularly  intact     ASSESSMENT & PLAN:   Plantar fasciitis, right Acute on chronic in nature.  Has tried several measures thus far.  Has tried insoles and orthotics in the past. -Counseled on home exercise therapy and supportive care. -Discussed orthotics and will hold off for now.  She could consider adding scaphoid pads.

## 2019-07-18 ENCOUNTER — Telehealth: Payer: Self-pay

## 2019-07-18 NOTE — Telephone Encounter (Signed)
Pt called and stated that she has not heard anything regarding scheduling the MRI of her rt foot that was ordered on 07/05/2019.

## 2019-07-18 NOTE — Telephone Encounter (Signed)
Pt is to schedule with [facility] in [facility] (Dr. Raeford Razor). She will need to call [phone] to schedule the appointment. Thanks.

## 2019-07-19 NOTE — Telephone Encounter (Signed)
Pt aware via voicemail that she needs to call [facility] in [place]. Phone number to their office left on voicemail. Instructed pt to call back if she has any questions or concerns.

## 2019-07-21 ENCOUNTER — Other Ambulatory Visit: Payer: Self-pay

## 2019-07-21 ENCOUNTER — Ambulatory Visit (INDEPENDENT_AMBULATORY_CARE_PROVIDER_SITE_OTHER): Payer: Medicare HMO

## 2019-07-21 DIAGNOSIS — M79671 Pain in right foot: Secondary | ICD-10-CM | POA: Diagnosis not present

## 2019-07-21 DIAGNOSIS — M722 Plantar fascial fibromatosis: Secondary | ICD-10-CM

## 2019-08-02 ENCOUNTER — Other Ambulatory Visit: Payer: Self-pay

## 2019-08-02 ENCOUNTER — Ambulatory Visit: Payer: Medicare HMO | Admitting: Sports Medicine

## 2019-08-02 ENCOUNTER — Ambulatory Visit (INDEPENDENT_AMBULATORY_CARE_PROVIDER_SITE_OTHER): Payer: Medicare HMO | Admitting: Sports Medicine

## 2019-08-02 DIAGNOSIS — M722 Plantar fascial fibromatosis: Secondary | ICD-10-CM

## 2019-08-02 DIAGNOSIS — M48061 Spinal stenosis, lumbar region without neurogenic claudication: Secondary | ICD-10-CM | POA: Diagnosis not present

## 2019-08-02 NOTE — Assessment & Plan Note (Signed)
This 59 year old female has some pain in her buttock, she was convinced she had a crack in her hips or her spine/sacrum and I advised her that imaging did not reveal any of this, MRI did however reveal L3-L4 spinal stenosis, moderate. This is the likely cause of her abnormal sensations going down the leg, she feels as though she does not have control of her legs. She was agreeable to do physical therapy but seems like the first appointment is in August, she needs to do a solid month of physical therapy and then I will see her back, epidurals would be an option but she tells me she would never want an epidural so she fails physical therapy she may need to see the surgeon.

## 2019-08-02 NOTE — Assessment & Plan Note (Signed)
I have advised her to get custom orthotics for her plantar fasciitis, she tells me she already had injections that were not effective. She will return to get custom orthotics, or also like her to work on her plantar fascia in physical therapy. If insufficient improvement because she does not want an injection I would have her see if there is a surgical option with Dr. Jacqualyn Posey downstairs.

## 2019-08-02 NOTE — Progress Notes (Signed)
    Procedures performed today:    None.  Independent interpretation of notes and tests performed by another provider:   None.  Brief History, Exam, Impression, and Recommendations:    Lumbar spinal stenosis This 59 year old female has some pain in her buttock, she was convinced she had a crack in her hips or her spine/sacrum and I advised her that imaging did not reveal any of this, MRI did however reveal L3-L4 spinal stenosis, moderate. This is the likely cause of her abnormal sensations going down the leg, she feels as though she does not have control of her legs. She was agreeable to do physical therapy but seems like the first appointment is in August, she needs to do a solid month of physical therapy and then I will see her back, epidurals would be an option but she tells me she would never want an epidural so she fails physical therapy she may need to see the surgeon.  Plantar fasciitis, right I have advised her to get custom orthotics for her plantar fasciitis, she tells me she already had injections that were not effective. She will return to get custom orthotics, or also like her to work on her plantar fascia in physical therapy. If insufficient improvement because she does not want an injection I would have her see if there is a surgical option with Dr. Jacqualyn Posey downstairs.  I spent 30 minutes of total time managing this patient today, this includes chart review, face to face, and non-face to face time.   ___________________________________________ Gwen Her. Dianah Field, M.D., ABFM., CAQSM. Primary Care and [place] Instructor of [place] of [facility] of Medicine

## 2019-08-05 ENCOUNTER — Ambulatory Visit: Payer: Medicare HMO | Admitting: Sports Medicine

## 2019-08-15 ENCOUNTER — Ambulatory Visit: Payer: Medicare HMO | Admitting: Physical Therapy

## 2019-08-16 ENCOUNTER — Telehealth: Payer: Self-pay | Admitting: *Deleted

## 2019-08-16 NOTE — Telephone Encounter (Signed)
Pt called states Dr. Paulla Dolly was treating her for foot fungus and gave her a shot. I informed pt the only time we saw her was 10/2018. Pt states she can not see that on MyChart and I told her I would have Dr. Paulla Dolly release that too her.

## 2019-08-22 ENCOUNTER — Ambulatory Visit: Payer: Medicare HMO | Admitting: Physical Therapy

## 2019-08-23 ENCOUNTER — Telehealth: Payer: Self-pay | Admitting: *Deleted

## 2019-08-23 NOTE — Telephone Encounter (Signed)
Returned patient's phone call concerning appointment she had with Dr. Paulla Dolly on October 24, 2018.  Patient wanted to know what type of injection Dr. Paulla Dolly gave her at her last appointment. I told patient it was a steroid injection. Patient stated, "I was not there to get an injection. Dr. Paulla Dolly just gave me one without my consent. I was there for my nails. The brace that I was given did not help. The boot that they gave me does help my foot though".  Patient also stated, "Dr. Paulla Dolly just came in and out of the room very fast and quick. I would never use him as a doctor again. I was very unhappy with my appointment".  I apologized to the patient for her experience.  Patient stated she understood.

## 2019-08-29 ENCOUNTER — Encounter: Payer: Medicare HMO | Admitting: Physical Therapy

## 2019-09-05 ENCOUNTER — Encounter: Payer: Medicare HMO | Admitting: Physical Therapy

## 2019-09-27 ENCOUNTER — Ambulatory Visit: Payer: Medicare HMO | Admitting: Sports Medicine

## 2019-10-09 ENCOUNTER — Other Ambulatory Visit: Payer: Self-pay

## 2019-10-09 ENCOUNTER — Encounter: Payer: Self-pay | Admitting: Allergy and Immunology

## 2019-10-09 ENCOUNTER — Telehealth: Payer: Self-pay

## 2019-10-09 ENCOUNTER — Ambulatory Visit: Payer: Medicare HMO | Admitting: Allergy and Immunology

## 2019-10-09 VITALS — BP 134/82 | HR 108 | Resp 16 | Ht 66.5 in | Wt 163.6 lb

## 2019-10-09 DIAGNOSIS — J3489 Other specified disorders of nose and nasal sinuses: Secondary | ICD-10-CM

## 2019-10-09 DIAGNOSIS — T17308A Unspecified foreign body in larynx causing other injury, initial encounter: Secondary | ICD-10-CM | POA: Diagnosis not present

## 2019-10-09 DIAGNOSIS — K219 Gastro-esophageal reflux disease without esophagitis: Secondary | ICD-10-CM | POA: Diagnosis not present

## 2019-10-09 DIAGNOSIS — R059 Cough, unspecified: Secondary | ICD-10-CM | POA: Diagnosis not present

## 2019-10-09 MED ORDER — OMEPRAZOLE 40 MG PO CPDR: DELAYED_RELEASE_CAPSULE | ORAL | 5 refills | Status: DC

## 2019-10-09 MED ORDER — FAMOTIDINE 40 MG PO TABS: ORAL_TABLET | ORAL | 5 refills | Status: DC

## 2019-10-09 NOTE — Progress Notes (Signed)
[place] [facility] [place]   Follow-up Note  Referring Provider: Nickola Major, MD Primary Provider: Nickola Major, MD Date of Office Visit: 10/09/2019  Subjective:   Grace Taylor (DOB: [birth year]) is a 59 y.o. female who returns to the [facility] [place] on 10/09/2019 in re-evaluation of the following:  HPI: Grace Taylor presents to this clinic in evaluation of cough.  She has not been seen in this clinic since 10 September 2015 at which point time she had a dermatitis which apparently has resolved.  She describes a 1 month history of her throat closing up.  She feels as though there is knots in her throat and she has drainage in her throat.  She finds it difficult to swallow.  She will develop choking if she swallows liquids.  She is now eating a pured diet because she can eat large particle foods.  She has a dry hacking cough.  Sometimes she makes yellow phlegm and she has had some raspy voice and she has burning in her sternum.  She has no significant upper airway symptoms.  Apparently she has been to the urgent care center twice and has had multiple E-med contacts and has been treated with azithromycin and doxycycline and nasal fluticasone and various mucolytic drugs and antihistamines and decongestants.  She apparently had a chest x-ray which apparently is normal.  She has been tested for strep and Covid which have been negative.  Allergies as of 10/09/2019      Reactions   Codeine    Penicillins Hives   Sulfonamide Derivatives Nausea And Vomiting, Rash      Medication List    benzonatate 100 MG capsule Commonly known as: TESSALON Take 100 mg by mouth 2 (two) times daily as needed.   buPROPion 150 MG 24 hr tablet Commonly known as: WELLBUTRIN XL   CALCIUM PO Take by mouth.   CITRUS BERGAMOT PO Take by mouth.   CLARITIN-D 24 HOUR PO Take by mouth.   clonazePAM 0.5 MG tablet Commonly known as: KLONOPIN Take 1  tablet by mouth 2 (two) times daily.   EYE [place] OP Apply to eye.   fluticasone 50 MCG/ACT nasal spray Commonly known as: FLONASE Place 2 sprays into both nostrils daily.   GUAIFENESIN ER PO Take 600 mg by mouth in the morning and at bedtime.   magic mouthwash Soln Take 5 mLs by mouth.   MAGNESIUM PO Take by mouth.   multivitamin with minerals Tabs tablet Take 1 tablet by mouth daily.   NASAL SALINE NA Place into the nose.   phentermine 37.5 MG tablet Commonly known as: ADIPEX-P Take 18.75-37.5 mg by mouth daily.   TURMERIC PO Take by mouth.   zolpidem 10 MG tablet Commonly known as: AMBIEN Take 10 mg by mouth at bedtime.       Past Medical History:  Diagnosis Date  . Allergy   . Anemia   . Benign colon polyp 04/07/2017   Q 5 year recall  . Chronic neck pain - post injury, worker's comp 2013, 2018 04/07/2017   Dr. Mardelle Matte  . Clostridium difficile infection   . History of peptic ulcer 04/07/2017  . Hyperlipidemia   . IBS (irritable bowel syndrome) 04/07/2017   Colonoscopy by Carlean Purl  . Migraines   . PTSD (post-traumatic stress disorder) 04/07/2017  . Ulcer    age 55    Past Surgical History:  Procedure Laterality Date  . CHOLECYSTECTOMY  2012  .  COLONOSCOPY  2014  . DG GALL BLADDER     2012  . ESOPHAGOGASTRODUODENOSCOPY  2014  . FLEXIBLE SIGMOIDOSCOPY  03/2014  . OVARIAN CYST REMOVAL    . right shoulder surgery    . TMJ ARTHROSCOPY      Review of systems negative except as noted in HPI / PMHx or noted below:  Review of Systems  Constitutional: Negative.   HENT: Negative.   Eyes: Negative.   Respiratory: Negative.   Cardiovascular: Negative.   Gastrointestinal: Negative.   Genitourinary: Negative.   Musculoskeletal: Negative.   Skin: Negative.   Neurological: Negative.   Endo/Heme/Allergies: Negative.   Psychiatric/Behavioral: Negative.      Objective:   Vitals:   10/09/19 0935  BP: 134/82  Pulse: (!) 108  Resp: 16  SpO2: 98%    Height: 5' 6.5" (168.9 cm)  Weight: 163 lb 9.6 oz (74.2 kg)   Physical Exam Constitutional:      Appearance: She is not diaphoretic.  HENT:     Head: Normocephalic.     Right Ear: Tympanic membrane, ear canal and external ear normal.     Left Ear: Tympanic membrane, ear canal and external ear normal.     Nose: No mucosal edema (3 mm septal perforation) or rhinorrhea.     Mouth/Throat:     Pharynx: Uvula midline. No oropharyngeal exudate.  Eyes:     Conjunctiva/sclera: Conjunctivae normal.  Neck:     Thyroid: No thyromegaly.     Trachea: Trachea normal. No tracheal tenderness or tracheal deviation.  Cardiovascular:     Rate and Rhythm: Normal rate and regular rhythm.     Heart sounds: Normal heart sounds, S1 normal and S2 normal. No murmur heard.   Pulmonary:     Effort: No respiratory distress.     Breath sounds: Normal breath sounds. No stridor. No wheezing or rales.  Lymphadenopathy:     Head:     Right side of head: No tonsillar adenopathy.     Left side of head: No tonsillar adenopathy.     Cervical: No cervical adenopathy.  Skin:    Findings: No erythema or rash.     Nails: There is no clubbing.  Neurological:     Mental Status: She is alert.     Diagnostics:    Spirometry was performed and demonstrated an FEV1 of 2.35 at 86 % of predicted.   Assessment and Plan:   1. Choking, initial encounter   2. Coughing   3. LPRD (laryngopharyngeal reflux disease)   4. Nasal septal perforation     1.  Review [facility] from [facility]  2.  Treat and prevent reflux:   A.  Minimize all forms of caffeine consumption  B.  Omeprazole 40 mg -1 tablet twice a day  C.  Famotidine 40 mg -1 tablet in evening  3.  Continue Tessalon Perles if needed  4.  Stop nasal fluticasone  5.  Obtain a barium swallow for choking  6.  Return to clinic in 2 weeks or earlier if problem  Grace Taylor has some form of irritation and inflammation and motor dysfunction  affecting her mid airway and I suspect that this is probably LPR.  If she does not have a good response to the therapy noted above then we will refer her onto ENT and obtain a modified barium swallow and consider further evaluation for neurological defect.  I will see her back in his clinic in 2 weeks or earlier  if there is a problem.  Grace Katz, MD Allergy / Immunology [place]

## 2019-10-09 NOTE — Telephone Encounter (Signed)
Called and informed patient that she is scheduled for a Barium Swallow at [facility] ( [place] location) on Tuesday, October 12th needing to check in at 9:10am.   Note: No prior authorization needed per insurance. Call reference # [id number]

## 2019-10-09 NOTE — Patient Instructions (Addendum)
  1.  Review chest x-ray report from [facility]  2.  Treat and prevent reflux:   A.  Minimize all forms of caffeine consumption  B.  Omeprazole 40 mg -1 tablet twice a day  C.  Famotidine 40 mg -1 tablet in evening  3.  Continue Tessalon Perles if needed  4.  Stop nasal fluticasone  5.  Obtain a barium swallow for choking  6.  Return to clinic in 2 weeks or earlier if problem

## 2019-10-10 ENCOUNTER — Encounter: Payer: Self-pay | Admitting: Allergy and Immunology

## 2019-10-15 ENCOUNTER — Encounter: Payer: Self-pay | Admitting: *Deleted

## 2019-10-15 ENCOUNTER — Ambulatory Visit
Admission: RE | Admit: 2019-10-15 | Discharge: 2019-10-15 | Disposition: A | Discharge status: Home / Self Care | Payer: Medicare HMO | Source: Ambulatory Visit | Attending: Allergy and Immunology | Admitting: Allergy and Immunology

## 2019-10-22 ENCOUNTER — Encounter: Payer: Self-pay | Admitting: Allergy and Immunology

## 2019-10-22 DIAGNOSIS — R490 Dysphonia: Secondary | ICD-10-CM | POA: Insufficient documentation

## 2019-10-22 DIAGNOSIS — B3789 Other sites of candidiasis: Secondary | ICD-10-CM | POA: Insufficient documentation

## 2019-10-22 DIAGNOSIS — R0989 Other specified symptoms and signs involving the circulatory and respiratory systems: Secondary | ICD-10-CM | POA: Insufficient documentation

## 2019-10-22 DIAGNOSIS — J385 Laryngeal spasm: Secondary | ICD-10-CM | POA: Insufficient documentation

## 2019-10-23 ENCOUNTER — Ambulatory Visit: Payer: Medicare HMO | Admitting: Allergy and Immunology

## 2019-10-24 ENCOUNTER — Ambulatory Visit: Payer: Medicare HMO | Admitting: Allergy and Immunology

## 2019-12-06 ENCOUNTER — Ambulatory Visit: Payer: Medicare HMO | Admitting: Obstetrics and Gynecology

## 2019-12-06 ENCOUNTER — Other Ambulatory Visit: Payer: Self-pay

## 2019-12-06 ENCOUNTER — Encounter: Payer: Self-pay | Admitting: Obstetrics and Gynecology

## 2019-12-06 VITALS — BP 138/80 | Ht 67.0 in | Wt 163.0 lb

## 2019-12-06 DIAGNOSIS — Z01419 Encounter for gynecological examination (general) (routine) without abnormal findings: Secondary | ICD-10-CM | POA: Diagnosis not present

## 2019-12-06 DIAGNOSIS — Z124 Encounter for screening for malignant neoplasm of cervix: Secondary | ICD-10-CM | POA: Diagnosis not present

## 2019-12-06 DIAGNOSIS — R3989 Other symptoms and signs involving the genitourinary system: Secondary | ICD-10-CM

## 2019-12-06 DIAGNOSIS — Z113 Encounter for screening for infections with a predominantly sexual mode of transmission: Secondary | ICD-10-CM

## 2019-12-06 LAB — URINALYSIS, COMPLETE W/RFL CULTURE
Bacteria, UA: NONE SEEN /HPF
Bilirubin Urine: NEGATIVE
Glucose, UA: NEGATIVE
Hgb urine dipstick: NEGATIVE
Hyaline Cast: NONE SEEN /LPF
Ketones, ur: NEGATIVE
Leukocyte Esterase: NEGATIVE
Nitrites, Initial: NEGATIVE
Protein, ur: NEGATIVE
RBC / HPF: NONE SEEN /HPF (ref 0–2)
Specific Gravity, Urine: 1.015 (ref 1.001–1.03)
WBC, UA: NONE SEEN /HPF (ref 0–5)
pH: 7 (ref 5.0–8.0)

## 2019-12-06 LAB — NO CULTURE INDICATED

## 2019-12-06 MED ORDER — MEDROXYPROGESTERONE ACETATE 150 MG/ML IM SUSP: 150.0000 mg | Freq: Once | INTRAMUSCULAR | Status: DC

## 2019-12-06 NOTE — Progress Notes (Signed)
Grace Taylor 1960/11/09 [id number]  SUBJECTIVE:  59 y.o. G2P0020 female here to establish for her annual routine gynecologic exam and Pap smear.  Last seen in this office in 2012. She thinks there is foam in her urine so she would request a urinalysis. Also requests STD screening as she had unprotected sex with a new partner. She has no gynecologic concerns.  Current Outpatient Medications  Medication Sig Dispense Refill  . buPROPion (WELLBUTRIN XL) 150 MG 24 hr tablet     . CITRUS BERGAMOT PO Take by mouth.    . clonazePAM (KLONOPIN) 0.5 MG tablet Take 1 tablet by mouth 2 (two) times daily.  1  . fluticasone (FLONASE) 50 MCG/ACT nasal spray Place 2 sprays into both nostrils daily.     . Loratadine-Pseudoephedrine (CLARITIN-D 24 HOUR PO) Take by mouth.    . magic mouthwash SOLN Take 5 mLs by mouth.    Marland Kitchen MAGNESIUM PO Take by mouth.    . Multiple Vitamin (MULITIVITAMIN WITH MINERALS) TABS Take 1 tablet by mouth daily.    Marland Kitchen NASAL SALINE NA Place into the nose.    Marland Kitchen Ophthalmic Irrigation Solution (EYE [place] OP) Apply to eye.    . phentermine (ADIPEX-P) 37.5 MG tablet Take 18.75-37.5 mg by mouth daily.    Marland Kitchen zolpidem (AMBIEN) 10 MG tablet Take 10 mg by mouth at bedtime.      No current facility-administered medications for this visit.   Allergies: Codeine, Penicillins, and Sulfonamide derivatives  No LMP recorded. Patient is postmenopausal.  Past medical history,surgical history, problem list, medications, allergies, family history and social history were all reviewed and documented as reviewed in the EPIC chart.  ROS: Pertinent positives and negatives as reviewed in HPI   OBJECTIVE:  BP 138/80 (BP Location: Right Arm, Patient Position: Sitting, Cuff Size: Normal)   Ht 5\' 7"  (1.702 m)   Wt 163 lb (73.9 kg)   BMI 25.53 kg/m  The patient appears well, alert, oriented, in no distress. ENT normal.  Neck supple. No cervical or supraclavicular adenopathy or thyromegaly.  Lungs  are clear, good air entry, no wheezes, rhonchi or rales. S1 and S2 normal, no murmurs, regular rate and rhythm.  Abdomen soft without tenderness, guarding, mass or organomegaly.  Neurological is normal, no focal findings.  BREAST EXAM: breasts appear normal, no suspicious masses, no skin or nipple changes or axillary nodes  PELVIC EXAM: VULVA: normal appearing vulva with atrophic change, no masses, tenderness or lesions, VAGINA: normal appearing vagina with atrophic change, normal color and discharge, no lesions, CERVIX: normal appearing cervix without discharge or lesions, UTERUS: uterus is normal size, shape, consistency and nontender, ADNEXA: normal adnexa in size, nontender and no masses, RECTAL: normal rectal, no masses, PAP: Pap smear done today, thin-prep method  Chaperone: Wandra Scot Bonham present during the examination  ASSESSMENT:  59 y.o. G2P0020 here for annual gynecologic exam  PLAN:   1. Postmenopausal.  Significant hot flashes or night sweats.  No vaginal bleeding. 2. Pap smear 08/2016.  She reports no history of abnormal Pap smears.  Pap smear is collected today. 3. STD screening is performed at the patient's request.  GC/Chlamydia probe, serum screening for HIV, hepatitis C antibody, hepatitis B surface antigen, RPR orders are placed 4. Mammogram 03/2019.  Normal breast exam today.  Continue with annual mammogram when due. 5. Colonoscopy 2017.  She should follow up at the recommended interval.   6.  Reported foam in urine.  Urinalysis is completely unremarkable today.  I recommended that she do establish with an internist as she describes chronic nongynecologic medical concerns. 7. Health maintenance.  No routine screening labs today as she normally has these completed with her primary care provider.    Return annually or sooner, prn.  Joseph Pierini MD 12/06/19

## 2019-12-07 LAB — C. TRACHOMATIS/N. GONORRHOEAE RNA
C. trachomatis RNA, TMA: NOT DETECTED
N. gonorrhoeae RNA, TMA: NOT DETECTED

## 2019-12-10 LAB — PAP IG W/ RFLX HPV ASCU

## 2020-01-15 DIAGNOSIS — R69 Illness, unspecified: Secondary | ICD-10-CM | POA: Diagnosis not present

## 2020-01-18 DIAGNOSIS — Z2821 Immunization not carried out because of patient refusal: Secondary | ICD-10-CM | POA: Diagnosis not present

## 2020-01-18 DIAGNOSIS — R635 Abnormal weight gain: Secondary | ICD-10-CM | POA: Diagnosis not present

## 2020-01-18 DIAGNOSIS — E663 Overweight: Secondary | ICD-10-CM | POA: Diagnosis not present

## 2020-01-18 DIAGNOSIS — E7800 Pure hypercholesterolemia, unspecified: Secondary | ICD-10-CM | POA: Diagnosis not present

## 2020-01-23 DIAGNOSIS — H02831 Dermatochalasis of right upper eyelid: Secondary | ICD-10-CM | POA: Diagnosis not present

## 2020-01-23 DIAGNOSIS — H57813 Brow ptosis, bilateral: Secondary | ICD-10-CM | POA: Diagnosis not present

## 2020-01-23 DIAGNOSIS — H53483 Generalized contraction of visual field, bilateral: Secondary | ICD-10-CM | POA: Diagnosis not present

## 2020-01-23 DIAGNOSIS — H04123 Dry eye syndrome of bilateral lacrimal glands: Secondary | ICD-10-CM | POA: Diagnosis not present

## 2020-01-23 DIAGNOSIS — H0279 Other degenerative disorders of eyelid and periocular area: Secondary | ICD-10-CM | POA: Diagnosis not present

## 2020-01-23 DIAGNOSIS — H02834 Dermatochalasis of left upper eyelid: Secondary | ICD-10-CM | POA: Diagnosis not present

## 2020-01-24 ENCOUNTER — Other Ambulatory Visit: Payer: Self-pay | Admitting: Allergy and Immunology

## 2020-01-24 ENCOUNTER — Other Ambulatory Visit: Payer: Self-pay

## 2020-01-24 MED ORDER — OMEPRAZOLE 40 MG PO CPDR: 40.0000 mg | DELAYED_RELEASE_CAPSULE | Freq: Every day | ORAL | 0 refills | Status: DC

## 2020-01-24 MED ORDER — FAMOTIDINE 40 MG PO TABS: 40.0000 mg | ORAL_TABLET | Freq: Every day | ORAL | 0 refills | Status: DC

## 2020-01-24 NOTE — Telephone Encounter (Signed)
Called and informed patient the her refills have been sent and patient made an appointment.

## 2020-01-31 DIAGNOSIS — M545 Low back pain, unspecified: Secondary | ICD-10-CM | POA: Diagnosis not present

## 2020-02-04 DIAGNOSIS — H53483 Generalized contraction of visual field, bilateral: Secondary | ICD-10-CM | POA: Diagnosis not present

## 2020-02-04 DIAGNOSIS — H53482 Generalized contraction of visual field, left eye: Secondary | ICD-10-CM | POA: Diagnosis not present

## 2020-02-04 DIAGNOSIS — H53481 Generalized contraction of visual field, right eye: Secondary | ICD-10-CM | POA: Diagnosis not present

## 2020-02-05 DIAGNOSIS — R69 Illness, unspecified: Secondary | ICD-10-CM | POA: Diagnosis not present

## 2020-02-07 DIAGNOSIS — M545 Low back pain, unspecified: Secondary | ICD-10-CM | POA: Diagnosis not present

## 2020-02-13 DIAGNOSIS — M545 Low back pain, unspecified: Secondary | ICD-10-CM | POA: Diagnosis not present

## 2020-03-05 DIAGNOSIS — R69 Illness, unspecified: Secondary | ICD-10-CM | POA: Diagnosis not present

## 2020-03-09 ENCOUNTER — Telehealth: Payer: Self-pay | Admitting: Cardiology

## 2020-03-09 DIAGNOSIS — R072 Precordial pain: Secondary | ICD-10-CM

## 2020-03-09 NOTE — Telephone Encounter (Signed)
Order placed for CT Calcium score. Patient notified of cost and that someone will reach out to her to schedule this CT scan.

## 2020-03-09 NOTE — Telephone Encounter (Signed)
Spoke with the patient. She was calling to request a cardiac calcium score test.

## 2020-03-09 NOTE — Telephone Encounter (Signed)
Patient is requesting a calcium scoring test, please advise.

## 2020-03-09 NOTE — Telephone Encounter (Signed)
Sounds like a reasonable request.

## 2020-03-10 ENCOUNTER — Other Ambulatory Visit: Payer: Self-pay

## 2020-03-10 ENCOUNTER — Ambulatory Visit: Payer: Medicare HMO | Admitting: Allergy and Immunology

## 2020-03-10 ENCOUNTER — Encounter: Payer: Self-pay | Admitting: Allergy and Immunology

## 2020-03-10 VITALS — BP 118/72 | HR 84 | Temp 97.8°F | Resp 16 | Ht 67.0 in | Wt 166.8 lb

## 2020-03-10 DIAGNOSIS — J3089 Other allergic rhinitis: Secondary | ICD-10-CM

## 2020-03-10 DIAGNOSIS — H04123 Dry eye syndrome of bilateral lacrimal glands: Secondary | ICD-10-CM

## 2020-03-10 DIAGNOSIS — K219 Gastro-esophageal reflux disease without esophagitis: Secondary | ICD-10-CM

## 2020-03-10 NOTE — Progress Notes (Signed)
[facility] [place] [facility] [place]   Follow-up Note  Referring Provider: Nickola Major, MD Primary Provider: Nickola Major, MD Date of Office Visit: 03/10/2020  Subjective:   Grace Taylor (DOB: 1960/01/26) is a 60 y.o. female who returns to the [facility] [place] on 03/10/2020 in re-evaluation of the following:  HPI: Grace Taylor presents to this clinic in evaluation of respiratory tract issues addressed during her last evaluation of 09 October 2019.  During her last evaluation she had a cough and she felt as though her throat was closing up and she had lots of drainage in her throat and found it difficult to swallow.  We obtained a barium swallow and referred her on to [facility] voice disorder center at the same time that we aggressively treated LPR.    Fortunately, as a result of that evaluation and therapy, she has no cough and she has no swallowing problems and she has no classic reflux symptoms.  She also has an issue with dry eye syndrome and she is using Claritin-D.  Allergies as of 03/10/2020      Reactions   Codeine    Penicillins Hives   Sulfonamide Derivatives Nausea And Vomiting, Rash      Medication List    CLARITIN-D 24 HOUR PO Take by mouth.   clonazePAM 0.5 MG tablet Commonly known as: KLONOPIN Take 1 tablet by mouth 2 (two) times daily.   EYE [place] OP Apply to eye.   famotidine 40 MG tablet Commonly known as: PEPCID Take 1 tablet (40 mg total) by mouth daily.   fluticasone 50 MCG/ACT nasal spray Commonly known as: FLONASE Place 2 sprays into both nostrils daily.   magic mouthwash Soln Take 5 mLs by mouth.   MULTIVITAMIN ADULT EXTRA C PO multivitamin   multivitamin with minerals Tabs tablet Take 1 tablet by mouth daily.   NASAL SALINE NA Place into the nose.   Omega-3 1000 MG Caps Omega 3   omeprazole 40 MG capsule Commonly known as: PRILOSEC Take 1 capsule (40 mg total) by mouth daily.    phentermine 37.5 MG tablet Commonly known as: ADIPEX-P Take 18.75-37.5 mg by mouth daily.   zolpidem 10 MG tablet Commonly known as: AMBIEN Take 10 mg by mouth at bedtime.       Past Medical History:  Diagnosis Date  . Allergy   . Anemia   . Benign colon polyp 04/07/2017   Q 5 year recall  . Chronic neck pain - post injury, worker's comp 2013, 2018 04/07/2017   Dr. Mardelle Matte  . Clostridium difficile infection   . History of peptic ulcer 04/07/2017  . Hyperlipidemia   . IBS (irritable bowel syndrome) 04/07/2017   Colonoscopy by Carlean Purl  . Migraines   . PTSD (post-traumatic stress disorder) 04/07/2017  . Ulcer    age 36    Past Surgical History:  Procedure Laterality Date  . CHOLECYSTECTOMY  2012  . COLONOSCOPY  2014  . DG GALL BLADDER     2012  . ESOPHAGOGASTRODUODENOSCOPY  2014  . FLEXIBLE SIGMOIDOSCOPY  03/2014  . OVARIAN CYST REMOVAL    . right shoulder surgery    . TMJ ARTHROSCOPY      Review of systems negative except as noted in HPI / PMHx or noted below:  Review of Systems  Constitutional: Negative.   HENT: Negative.   Eyes: Negative.   Respiratory: Negative.   Cardiovascular: Negative.   Gastrointestinal: Negative.   Genitourinary:  Negative.   Musculoskeletal: Negative.   Skin: Negative.   Neurological: Negative.   Endo/Heme/Allergies: Negative.   Psychiatric/Behavioral: Negative.      Objective:   Vitals:   03/10/20 1130  BP: 118/72  Pulse: 84  Resp: 16  Temp: 97.8 F (36.6 C)  SpO2: 99%   Height: 5\' 7"  (170.2 cm)  Weight: 166 lb 12.8 oz (75.7 kg)   Physical Exam Constitutional:      Appearance: She is not diaphoretic.  HENT:     Head: Normocephalic.     Right Ear: Tympanic membrane, ear canal and external ear normal.     Left Ear: Tympanic membrane, ear canal and external ear normal.     Nose: Nose normal. No mucosal edema or rhinorrhea.     Mouth/Throat:     Mouth: Oropharynx is clear and moist and mucous membranes are normal.      Pharynx: Uvula midline. No oropharyngeal exudate.  Eyes:     Conjunctiva/sclera: Conjunctivae normal.  Neck:     Thyroid: No thyromegaly.     Trachea: Trachea normal. No tracheal tenderness or tracheal deviation.  Cardiovascular:     Rate and Rhythm: Normal rate and regular rhythm.     Heart sounds: Normal heart sounds, S1 normal and S2 normal. No murmur heard.   Pulmonary:     Effort: No respiratory distress.     Breath sounds: Normal breath sounds. No stridor. No wheezing or rales.  Musculoskeletal:        General: No edema.  Lymphadenopathy:     Head:     Right side of head: No tonsillar adenopathy.     Left side of head: No tonsillar adenopathy.     Cervical: No cervical adenopathy.  Skin:    Findings: No erythema or rash.     Nails: There is no clubbing.  Neurological:     Mental Status: She is alert.     Diagnostics: none  Assessment and Plan:   1. LPRD (laryngopharyngeal reflux disease)   2. Dry eye syndrome of both eyes   3. Perennial allergic rhinitis     1.  Continue to Treat and prevent reflux:   A.  Minimize all forms of caffeine consumption  B.  Omeprazole 40 mg -1 tablet 1-2 times a day  C.  Famotidine 40 mg -1 tablet in evening  2. Stop all antihistamine secondary to dry eye syndrome  3.  Can use nasal fluticasone - 1-2 sprays each nostril 3-7 times per week  4.  Return to clinic in 6 months or earlier if problem. Taper medications???  Grace Taylor appears to be doing much better at this point in time now that she has her LPR under better control.  She will remain on pretty aggressive therapy for her reflux for the next 6 months then when she returns to this clinic at that point time we will see if we can taper down her medications.  She can also continue on a nasal steroid.  I did warn her about using antihistamines in the setting of dry eye syndrome.  Grace Katz, MD Allergy / Immunology [place]

## 2020-03-10 NOTE — Patient Instructions (Signed)
  1.  Continue to Treat and prevent reflux:   A.  Minimize all forms of caffeine consumption  B.  Omeprazole 40 mg -1 tablet 1-2 times a day  C.  Famotidine 40 mg -1 tablet in evening  2. Stop all antihistamine secondary to dry eye syndrome  3.  Can use nasal fluticasone - 1-2 sprays each nostril 3-7 times per week  4.  Return to clinic in 6 months or earlier if problem. Taper medications???

## 2020-03-11 ENCOUNTER — Encounter: Payer: Self-pay | Admitting: Allergy and Immunology

## 2020-03-14 DIAGNOSIS — E663 Overweight: Secondary | ICD-10-CM | POA: Diagnosis not present

## 2020-03-14 DIAGNOSIS — R635 Abnormal weight gain: Secondary | ICD-10-CM | POA: Diagnosis not present

## 2020-03-14 DIAGNOSIS — E7800 Pure hypercholesterolemia, unspecified: Secondary | ICD-10-CM | POA: Diagnosis not present

## 2020-03-14 DIAGNOSIS — L309 Dermatitis, unspecified: Secondary | ICD-10-CM | POA: Diagnosis not present

## 2020-03-16 DIAGNOSIS — R69 Illness, unspecified: Secondary | ICD-10-CM | POA: Diagnosis not present

## 2020-04-03 ENCOUNTER — Other Ambulatory Visit: Payer: Self-pay | Admitting: Allergy and Immunology

## 2020-04-04 DIAGNOSIS — R635 Abnormal weight gain: Secondary | ICD-10-CM | POA: Diagnosis not present

## 2020-04-04 DIAGNOSIS — L309 Dermatitis, unspecified: Secondary | ICD-10-CM | POA: Diagnosis not present

## 2020-04-04 DIAGNOSIS — E663 Overweight: Secondary | ICD-10-CM | POA: Diagnosis not present

## 2020-04-04 DIAGNOSIS — E7800 Pure hypercholesterolemia, unspecified: Secondary | ICD-10-CM | POA: Diagnosis not present

## 2020-04-08 ENCOUNTER — Ambulatory Visit (INDEPENDENT_AMBULATORY_CARE_PROVIDER_SITE_OTHER)
Admission: RE | Admit: 2020-04-08 | Discharge: 2020-04-08 | Disposition: A | Discharge status: Home / Self Care | Payer: Self-pay | Source: Ambulatory Visit | Attending: Cardiology | Admitting: Cardiology

## 2020-04-08 ENCOUNTER — Other Ambulatory Visit: Payer: Self-pay

## 2020-04-08 DIAGNOSIS — R072 Precordial pain: Secondary | ICD-10-CM

## 2020-04-09 ENCOUNTER — Telehealth: Payer: Self-pay | Admitting: *Deleted

## 2020-04-09 DIAGNOSIS — I493 Ventricular premature depolarization: Secondary | ICD-10-CM

## 2020-04-09 DIAGNOSIS — R072 Precordial pain: Secondary | ICD-10-CM

## 2020-04-09 DIAGNOSIS — R931 Abnormal findings on diagnostic imaging of heart and coronary circulation: Secondary | ICD-10-CM

## 2020-04-09 DIAGNOSIS — R69 Illness, unspecified: Secondary | ICD-10-CM | POA: Diagnosis not present

## 2020-04-09 NOTE — Telephone Encounter (Signed)
Advised patient and sent to scheduling to arrange  

## 2020-04-09 NOTE — Telephone Encounter (Signed)
-----   Message from Minus Breeding, MD sent at 04/09/2020  8:46 AM EDT ----- She has some mild coronary calcium.  Given this it is reasonable to repeat a POET ([place] Exercise Treadmill).  She has not had one cine 2018.  Call Ms. Berringer with the results and send results to Nickola Major, MD

## 2020-04-16 ENCOUNTER — Telehealth: Payer: Self-pay | Admitting: *Deleted

## 2020-04-16 DIAGNOSIS — R931 Abnormal findings on diagnostic imaging of heart and coronary circulation: Secondary | ICD-10-CM

## 2020-04-16 NOTE — Telephone Encounter (Addendum)
-----   Message from [name] sent at 04/16/2020  8:06 AM EDT ----- Regarding: Not able to do GXT I spoke with patient this early morning and she states she cant walk on a Treadmill due to she has crushed vertebra and her leg goes numb.   Can she do another kind of stress test??

## 2020-04-16 NOTE — Telephone Encounter (Signed)
Will forward to Dr Shanon Rosser for review

## 2020-04-17 NOTE — Telephone Encounter (Signed)
She can have a [employer].

## 2020-04-22 NOTE — Telephone Encounter (Signed)
Spoke with patient, offered Lexiscan per Dr. Percival Spanish- patient does not want to do this at this time. Patient states that she is scared to do it. Patient states that she does not like to take medication of any kind.  Patient states that she has bad balance so she may not be able to walk on a treadmill, but patient states she definitely does not want to do the [place].   Advised patient that I would forward message to Dr. Percival Spanish to make him aware.   Advised patient to call back to office with any issues, questions, or concerns. Patient verbalized understanding.

## 2020-04-22 NOTE — Telephone Encounter (Signed)
Can someone please place the lexiscan order? Thank you!

## 2020-05-02 DIAGNOSIS — E7800 Pure hypercholesterolemia, unspecified: Secondary | ICD-10-CM | POA: Diagnosis not present

## 2020-05-02 DIAGNOSIS — R635 Abnormal weight gain: Secondary | ICD-10-CM | POA: Diagnosis not present

## 2020-05-02 DIAGNOSIS — Z6824 Body mass index (BMI) 24.0-24.9, adult: Secondary | ICD-10-CM | POA: Diagnosis not present

## 2020-05-04 NOTE — Telephone Encounter (Signed)
Patient is following up regarding having the stress test. She is reconsidering having the [place]. She states she spoke with her pcp and they advised that the [place] is a good idea. She would like to go forward with scheduling. An order is needed. Thank you.

## 2020-05-06 ENCOUNTER — Ambulatory Visit (HOSPITAL_COMMUNITY)
Admission: RE | Admit: 2020-05-06 | Payer: Medicare HMO | Source: Ambulatory Visit | Attending: Cardiology | Admitting: Cardiology

## 2020-05-06 DIAGNOSIS — L255 Unspecified contact dermatitis due to plants, except food: Secondary | ICD-10-CM | POA: Diagnosis not present

## 2020-05-07 DIAGNOSIS — R69 Illness, unspecified: Secondary | ICD-10-CM | POA: Diagnosis not present

## 2020-05-11 NOTE — Telephone Encounter (Signed)
Patient scheduled 5/11

## 2020-05-12 ENCOUNTER — Telehealth (HOSPITAL_COMMUNITY): Payer: Self-pay | Admitting: *Deleted

## 2020-05-12 NOTE — Telephone Encounter (Signed)
Close encounter 

## 2020-05-13 ENCOUNTER — Other Ambulatory Visit: Payer: Self-pay

## 2020-05-13 ENCOUNTER — Ambulatory Visit (HOSPITAL_COMMUNITY)
Admission: RE | Admit: 2020-05-13 | Discharge: 2020-05-13 | Disposition: A | Discharge status: Home / Self Care | Payer: Medicare HMO | Source: Ambulatory Visit | Attending: Cardiovascular Disease | Admitting: Cardiovascular Disease

## 2020-05-13 DIAGNOSIS — R931 Abnormal findings on diagnostic imaging of heart and coronary circulation: Secondary | ICD-10-CM | POA: Insufficient documentation

## 2020-05-13 DIAGNOSIS — R079 Chest pain, unspecified: Secondary | ICD-10-CM | POA: Diagnosis not present

## 2020-05-13 DIAGNOSIS — Z8249 Family history of ischemic heart disease and other diseases of the circulatory system: Secondary | ICD-10-CM | POA: Insufficient documentation

## 2020-05-13 LAB — MYOCARDIAL PERFUSION IMAGING
LV dias vol: 101 mL (ref 46–106)
LV sys vol: 49 mL
Peak HR: 109 {beats}/min
Rest HR: 72 {beats}/min
SDS: 0
SRS: 1
SSS: 1
TID: 1.03

## 2020-05-13 MED ORDER — TECHNETIUM TC 99M TETROFOSMIN IV KIT
10.2000 | PACK | Freq: Once | INTRAVENOUS | Status: AC | PRN
  Administered 2020-05-13: 10.2 via INTRAVENOUS
  Filled 2020-05-13: qty 11

## 2020-05-13 MED ORDER — REGADENOSON 0.4 MG/5ML IV SOLN
0.4000 mg | Freq: Once | INTRAVENOUS | Status: AC
  Administered 2020-05-13: 0.4 mg via INTRAVENOUS

## 2020-05-13 MED ORDER — TECHNETIUM TC 99M TETROFOSMIN IV KIT
31.7000 | PACK | Freq: Once | INTRAVENOUS | Status: AC | PRN
  Administered 2020-05-13: 31.7 via INTRAVENOUS
  Filled 2020-05-13: qty 32

## 2020-05-30 DIAGNOSIS — Z79899 Other long term (current) drug therapy: Secondary | ICD-10-CM | POA: Diagnosis not present

## 2020-05-30 DIAGNOSIS — E7800 Pure hypercholesterolemia, unspecified: Secondary | ICD-10-CM | POA: Diagnosis not present

## 2020-05-30 DIAGNOSIS — Z20822 Contact with and (suspected) exposure to covid-19: Secondary | ICD-10-CM | POA: Diagnosis not present

## 2020-05-30 DIAGNOSIS — Z113 Encounter for screening for infections with a predominantly sexual mode of transmission: Secondary | ICD-10-CM | POA: Diagnosis not present

## 2020-05-30 DIAGNOSIS — Z1339 Encounter for screening examination for other mental health and behavioral disorders: Secondary | ICD-10-CM | POA: Diagnosis not present

## 2020-05-30 DIAGNOSIS — R5383 Other fatigue: Secondary | ICD-10-CM | POA: Diagnosis not present

## 2020-05-30 DIAGNOSIS — Z1159 Encounter for screening for other viral diseases: Secondary | ICD-10-CM | POA: Diagnosis not present

## 2020-05-30 DIAGNOSIS — R69 Illness, unspecified: Secondary | ICD-10-CM | POA: Diagnosis not present

## 2020-05-30 DIAGNOSIS — E559 Vitamin D deficiency, unspecified: Secondary | ICD-10-CM | POA: Diagnosis not present

## 2020-05-30 DIAGNOSIS — Z1331 Encounter for screening for depression: Secondary | ICD-10-CM | POA: Diagnosis not present

## 2020-05-30 DIAGNOSIS — Z0000 Encounter for general adult medical examination without abnormal findings: Secondary | ICD-10-CM | POA: Diagnosis not present

## 2020-05-30 DIAGNOSIS — D539 Nutritional anemia, unspecified: Secondary | ICD-10-CM | POA: Diagnosis not present

## 2020-06-10 DIAGNOSIS — R931 Abnormal findings on diagnostic imaging of heart and coronary circulation: Secondary | ICD-10-CM | POA: Insufficient documentation

## 2020-06-10 DIAGNOSIS — M7989 Other specified soft tissue disorders: Secondary | ICD-10-CM | POA: Insufficient documentation

## 2020-06-10 NOTE — Progress Notes (Signed)
Cardiology Office Note   Date:  06/11/2020   ID:  Grace Taylor, DOB [birth year], MRN [id number]  PCP:  [facility]  Cardiologist:   Minus Breeding, MD    Chief Complaint  Patient presents with   Elevated Coronary Calcium      History of Present Illness: Grace Taylor is a 60 y.o. female who presented in 2018 for evaluation of strong family history, dizziness, episodes of shortness of breath and some chest discomfort.   She had a negative POET (Plain Old Exercise Treadmill ) in 2018.  She had a calcium score of 35 which was 78th percentile. She had a negative perfusion study.    Since I last saw her she has done well.  The patient denies any new symptoms such as chest discomfort, neck or arm discomfort. There has been no new shortness of breath, PND or orthopnea. There have been no reported palpitations, presyncope or syncope.    Past Medical History:  Diagnosis Date   Allergy    Anemia    Benign colon polyp 04/07/2017   Q 5 year recall   Chronic neck pain - post injury, worker's comp 2013, 2018 04/07/2017   Dr. Mardelle Matte   Clostridium difficile infection    History of peptic ulcer 04/07/2017   Hyperlipidemia    IBS (irritable bowel syndrome) 04/07/2017   Colonoscopy by Carlean Purl   Migraines    PTSD (post-traumatic stress disorder) 04/07/2017   Ulcer    age 11    Past Surgical History:  Procedure Laterality Date   CHOLECYSTECTOMY  2012   COLONOSCOPY  2014   DG GALL BLADDER     2012   ESOPHAGOGASTRODUODENOSCOPY  2014   FLEXIBLE SIGMOIDOSCOPY  03/2014   OVARIAN CYST REMOVAL     right shoulder surgery     TMJ ARTHROSCOPY       Current Outpatient Medications  Medication Sig Dispense Refill   clonazePAM (KLONOPIN) 0.5 MG tablet Take 1 tablet by mouth 2 (two) times daily.  1   famotidine (PEPCID) 40 MG tablet Take 1 tablet (40 mg total) by mouth daily. 30 tablet 0   fluticasone (FLONASE) 50 MCG/ACT nasal spray Place 2 sprays into both nostrils  daily.      magic mouthwash SOLN Take 5 mLs by mouth.     Multiple Vitamin (MULITIVITAMIN WITH MINERALS) TABS Take 1 tablet by mouth daily.     Multiple Vitamins-Minerals (MULTIVITAMIN ADULT EXTRA C PO) multivitamin     NASAL SALINE NA Place into the nose.     Omega-3 1000 MG CAPS Omega 3     omeprazole (PRILOSEC) 40 MG capsule Take 1 capsule (40 mg total) by mouth in the morning and at bedtime. 60 capsule 5   Ophthalmic Irrigation Solution (EYE [place] OP) Apply to eye.     phentermine (ADIPEX-P) 37.5 MG tablet Take 18.75-37.5 mg by mouth daily.     zolpidem (AMBIEN) 10 MG tablet Take 10 mg by mouth at bedtime.      No current facility-administered medications for this visit.    Allergies:   Codeine, Penicillins, and Sulfonamide derivatives    ROS:  Please see the history of present illness.   Otherwise, review of systems are positive for none.   All other systems are reviewed and negative.    PHYSICAL EXAM: VS:  BP 114/62 (BP Location: Right Arm, Patient Position: Sitting, Cuff Size: Normal)   Pulse 90   Ht _0  (1.676 m)  Wt 162 lb (73.5 kg)   SpO2 96%   BMI 26.15 kg/m  , BMI Body mass index is 26.15 kg/m. GENERAL:  Well appearing NECK:  No jugular venous distention, waveform within normal limits, carotid upstroke brisk and symmetric, no bruits, no thyromegaly LUNGS:  Clear to auscultation bilaterally CHEST:  Unremarkable HEART:  PMI not displaced or sustained,S1 and S2 within normal limits, no S3, no S4, no clicks, no rubs, no murmurs ABD:  Flat, positive bowel sounds normal in frequency in pitch, no bruits, no rebound, no guarding, no midline pulsatile mass, no hepatomegaly, no splenomegaly EXT:  2 plus pulses throughout, no edema, no cyanosis no clubbing   EKG:  EKG is not ordered today.   Recent Labs: No results found for requested labs within last 8760 hours.    Lipid Panel    Wt Readings from Last 3 Encounters:  06/11/20 162 lb (73.5 kg)  05/13/20 166 lb  (75.3 kg)  03/10/20 166 lb 12.8 oz (75.7 kg)      Other studies Reviewed: Additional studies/ records that were reviewed today include: Labs Review of the above records demonstrates:  Please see elsewhere in the note.     ASSESSMENT AND PLAN:  ELEVATED CORONARY CALCIUM:   She had a negative perfusion study.  We will continue with primary risk reduction.  She has been intolerant of statins.  She had elevated liver enzymes.  If her MESA score based on her lipids is elevated she would need PCSK9.  DYSLIPIDEMIA:    Previous LDL is mildly elevated but this is been over a year so I will repeat this.  She also was to have other labs drawn like her A1c, CBC and be met but these were unable to be drawn by her primary care as they were not able to get enough blood so I will take the liberty of performing these as well.  LEG SWELLING:    There was no evidence of DVT.  No further testing. Current medicines are reviewed at length with the patient today.  The patient does not have concerns regarding medicines.  The following changes have been made: None  Labs/ tests ordered today include:   Orders Placed This Encounter  Procedures   Lipid panel   Basic metabolic panel   CBC with Differential/Platelet   HgB A1c     Disposition:   FU with me in 2 years or sooner if needed  Signed, Minus Breeding, MD  06/11/2020 1:18 PM    [name]

## 2020-06-11 ENCOUNTER — Ambulatory Visit: Payer: Medicare HMO | Admitting: Cardiology

## 2020-06-11 ENCOUNTER — Other Ambulatory Visit: Payer: Self-pay

## 2020-06-11 ENCOUNTER — Encounter: Payer: Self-pay | Admitting: Cardiology

## 2020-06-11 VITALS — BP 114/62 | HR 90 | Ht 66.0 in | Wt 162.0 lb

## 2020-06-11 DIAGNOSIS — R931 Abnormal findings on diagnostic imaging of heart and coronary circulation: Secondary | ICD-10-CM

## 2020-06-11 DIAGNOSIS — M7989 Other specified soft tissue disorders: Secondary | ICD-10-CM | POA: Diagnosis not present

## 2020-06-11 DIAGNOSIS — Z13220 Encounter for screening for lipoid disorders: Secondary | ICD-10-CM

## 2020-06-11 DIAGNOSIS — Z131 Encounter for screening for diabetes mellitus: Secondary | ICD-10-CM

## 2020-06-11 NOTE — Patient Instructions (Signed)
Medication Instructions:  Your physician recommends that you continue on your current medications as directed. Please refer to the Current Medication list given to you today.  *If you need a refill on your cardiac medications before your next appointment, please call your pharmacy*  Lab Work: FASTING LP/BMET/A1C/CBC SOON   If you have labs (blood work) drawn today and your tests are completely normal, you will receive your results only by: [place] (if you have MyChart) OR A paper copy in the mail If you have any lab test that is abnormal or we need to change your treatment, we will call you to review the results.  Testing/Procedures: NONE  Follow-Up: At [facility], you and your health needs are our priority.  As part of our continuing mission to provide you with exceptional heart care, we have created designated Provider Care Teams.  These Care Teams include your primary Cardiologist (physician) and Advanced Practice Providers (APPs -  Physician Assistants and Nurse Practitioners) who all work together to provide you with the care you need, when you need it.  We recommend signing up for the patient portal called "MyChart".  Sign up information is provided on this After Visit Summary.  MyChart is used to connect with patients for Virtual Visits (Telemedicine).  Patients are able to view lab/test results, encounter notes, upcoming appointments, etc.  Non-urgent messages can be sent to your provider as well.   To learn more about what you can do with MyChart, go to [email or web address].    Your next appointment:   2 year(s)  The format for your next appointment:   In Person  Provider:   You may see Minus Breeding, MD  or one of the following Advanced Practice Providers on your designated Care Team:   Rosaria Ferries, PA-C Jory Sims, DNP, ANP

## 2020-06-16 DIAGNOSIS — Z13220 Encounter for screening for lipoid disorders: Secondary | ICD-10-CM | POA: Diagnosis not present

## 2020-06-16 DIAGNOSIS — Z131 Encounter for screening for diabetes mellitus: Secondary | ICD-10-CM | POA: Diagnosis not present

## 2020-06-16 DIAGNOSIS — M7989 Other specified soft tissue disorders: Secondary | ICD-10-CM | POA: Diagnosis not present

## 2020-06-16 DIAGNOSIS — R69 Illness, unspecified: Secondary | ICD-10-CM | POA: Diagnosis not present

## 2020-06-16 DIAGNOSIS — R931 Abnormal findings on diagnostic imaging of heart and coronary circulation: Secondary | ICD-10-CM | POA: Diagnosis not present

## 2020-06-16 LAB — CBC WITH DIFFERENTIAL/PLATELET
Basophils Absolute: 0 10*3/uL (ref 0.0–0.2)
Basos: 1 %
EOS (ABSOLUTE): 0.1 10*3/uL (ref 0.0–0.4)
Eos: 2 %
Hematocrit: 38.1 % (ref 34.0–46.6)
Hemoglobin: 13 g/dL (ref 11.1–15.9)
Immature Grans (Abs): 0 10*3/uL (ref 0.0–0.1)
Immature Granulocytes: 0 %
Lymphocytes Absolute: 1.9 10*3/uL (ref 0.7–3.1)
Lymphs: 39 %
MCH: 31 pg (ref 26.6–33.0)
MCHC: 34.1 g/dL (ref 31.5–35.7)
MCV: 91 fL (ref 79–97)
Monocytes Absolute: 0.3 10*3/uL (ref 0.1–0.9)
Monocytes: 5 %
Neutrophils Absolute: 2.6 10*3/uL (ref 1.4–7.0)
Neutrophils: 53 %
Platelets: 232 10*3/uL (ref 150–450)
RBC: 4.2 x10E6/uL (ref 3.77–5.28)
RDW: 12.6 % (ref 11.7–15.4)
WBC: 4.9 10*3/uL (ref 3.4–10.8)

## 2020-06-16 LAB — BASIC METABOLIC PANEL
BUN/Creatinine Ratio: 12 (ref 12–28)
BUN: 9 mg/dL (ref 8–27)
CO2: 23 mmol/L (ref 20–29)
Calcium: 9.4 mg/dL (ref 8.7–10.3)
Chloride: 104 mmol/L (ref 96–106)
Creatinine, Ser: 0.73 mg/dL (ref 0.57–1.00)
Glucose: 81 mg/dL (ref 65–99)
Potassium: 4.8 mmol/L (ref 3.5–5.2)
Sodium: 141 mmol/L (ref 134–144)
eGFR: 94 mL/min/{1.73_m2} (ref >59)

## 2020-06-16 LAB — LIPID PANEL
Chol/HDL Ratio: 4.9 ratio — ABNORMAL HIGH (ref 0.0–4.4)
Cholesterol, Total: 275 mg/dL — ABNORMAL HIGH (ref 100–199)
HDL: 56 mg/dL (ref >39)
LDL Chol Calc (NIH): 198 mg/dL — ABNORMAL HIGH (ref 0–99)
Triglycerides: 116 mg/dL (ref 0–149)
VLDL Cholesterol Cal: 21 mg/dL (ref 5–40)

## 2020-06-16 LAB — HEMOGLOBIN A1C
Est. average glucose Bld gHb Est-mCnc: 103 mg/dL
Hgb A1c MFr Bld: 5.2 % (ref 4.8–5.6)

## 2020-06-22 ENCOUNTER — Other Ambulatory Visit: Payer: Self-pay | Admitting: *Deleted

## 2020-06-22 DIAGNOSIS — R931 Abnormal findings on diagnostic imaging of heart and coronary circulation: Secondary | ICD-10-CM

## 2020-06-27 DIAGNOSIS — R635 Abnormal weight gain: Secondary | ICD-10-CM | POA: Diagnosis not present

## 2020-06-27 DIAGNOSIS — E7800 Pure hypercholesterolemia, unspecified: Secondary | ICD-10-CM | POA: Diagnosis not present

## 2020-06-27 DIAGNOSIS — E663 Overweight: Secondary | ICD-10-CM | POA: Diagnosis not present

## 2020-07-12 ENCOUNTER — Other Ambulatory Visit: Payer: Self-pay | Admitting: Allergy and Immunology

## 2020-07-14 DIAGNOSIS — R69 Illness, unspecified: Secondary | ICD-10-CM | POA: Diagnosis not present

## 2020-07-21 ENCOUNTER — Telehealth: Payer: Self-pay

## 2020-07-21 ENCOUNTER — Other Ambulatory Visit: Payer: Self-pay

## 2020-07-21 ENCOUNTER — Ambulatory Visit (INDEPENDENT_AMBULATORY_CARE_PROVIDER_SITE_OTHER): Payer: Medicare HMO | Admitting: Pharmacist

## 2020-07-21 VITALS — BP 112/70 | HR 86 | Resp 14 | Ht 66.0 in | Wt 160.0 lb

## 2020-07-21 DIAGNOSIS — E785 Hyperlipidemia, unspecified: Secondary | ICD-10-CM

## 2020-07-21 DIAGNOSIS — T466X5A Adverse effect of antihyperlipidemic and antiarteriosclerotic drugs, initial encounter: Secondary | ICD-10-CM | POA: Insufficient documentation

## 2020-07-21 DIAGNOSIS — R931 Abnormal findings on diagnostic imaging of heart and coronary circulation: Secondary | ICD-10-CM

## 2020-07-21 DIAGNOSIS — M791 Myalgia, unspecified site: Secondary | ICD-10-CM

## 2020-07-21 MED ORDER — PRALUENT 150 MG/ML ~~LOC~~ SOAJ
150.0000 mg | SUBCUTANEOUS | 11 refills | Status: DC
Start: 2020-07-21 — End: 2021-06-29

## 2020-07-21 NOTE — Patient Instructions (Addendum)
It was nice meeting you today!  We would like your LDL (bad cholesterol) to be less than 70  Continue your heart healthy diet and exercise  We will start a new medication you will inject once every 2 weeks  We will check you cholesterol again in about 2-3 months to make sure the medication is working  Please call if you have any questions  Karren Cobble, PharmD, BCACP, [place], [place]. [address], [place], [place] [address] Phone: [phone]; Fax: [phone] 07/21/2020 10:00 AM

## 2020-07-21 NOTE — Telephone Encounter (Signed)
Called and spoke w/pt and stated that they were approved for praluent 150mg  biweekly injector, rx sent, pt instructed to complete fasting lipid labs post 4th dose and call us if the medication is unaffordable

## 2020-07-21 NOTE — Progress Notes (Signed)
Patient ID: Grace Taylor                 DOB: Oct 17, 1960                    MRN: [id number]     HPI: Grace Taylor is a 60 y.o. female patient referred to lipid clinic by Dr Percival Spanish. PMH is significant for HLD, statin intolerance, and anxiety.  Patient recently had a coronary CT which showed a calcium score of 35, which was 78th percentile for age-, race-, and sex-matched controls.  Patient presents today in good spirits but reports she is nervous regarding injections. Has had bad luck with blood draws in the past.  Patient is [place] and eats a heart healthy diet of vegetables, olives, salads, spinach, and lean proteins.  Walks about 5 miles a day.  Had muscle pain and elevated LFTs on simvastatin, rosuvastatin, and atorvastatin.  Current Medications: n/a Intolerances: Crestor, Lipitor, Zocor Risk Factors: Elevated coronary calcium score LDL goal: <70  Diet: vegetable, chicken. Olives, spinach, salads, nuts, cabbage, beets, feta, greek   Exercise: walks 5 miles/day  Social History: social drinker, no smoking  Labs: LDL 198, TC 275, Trigs 116, HDL 56 (06/16/20 not on any meds)  Past Medical History:  Diagnosis Date   Allergy    Anemia    Benign colon polyp 04/07/2017   Q 5 year recall   Chronic neck pain - post injury, worker's comp 2013, 2018 04/07/2017   Dr. Mardelle Matte   Clostridium difficile infection    History of peptic ulcer 04/07/2017   Hyperlipidemia    IBS (irritable bowel syndrome) 04/07/2017   Colonoscopy by Carlean Purl   Migraines    PTSD (post-traumatic stress disorder) 04/07/2017   Ulcer    age 42    Current Outpatient Medications on File Prior to Visit  Medication Sig Dispense Refill   clonazePAM (KLONOPIN) 0.5 MG tablet Take 1 tablet by mouth 2 (two) times daily.  1   famotidine (PEPCID) 40 MG tablet TAKE 1 TABLET BY MOUTH EVERY DAY 30 tablet 2   fluticasone (FLONASE) 50 MCG/ACT nasal spray Place 2 sprays into both nostrils daily.      magic  mouthwash SOLN Take 5 mLs by mouth.     Multiple Vitamin (MULITIVITAMIN WITH MINERALS) TABS Take 1 tablet by mouth daily.     Multiple Vitamins-Minerals (MULTIVITAMIN ADULT EXTRA C PO) multivitamin     NASAL SALINE NA Place into the nose.     Omega-3 1000 MG CAPS Omega 3     omeprazole (PRILOSEC) 40 MG capsule Take 1 capsule (40 mg total) by mouth in the morning and at bedtime. 60 capsule 5   Ophthalmic Irrigation Solution (EYE [place] OP) Apply to eye.     phentermine (ADIPEX-P) 37.5 MG tablet Take 18.75-37.5 mg by mouth daily.     zolpidem (AMBIEN) 10 MG tablet Take 10 mg by mouth at bedtime.      No current facility-administered medications on file prior to visit.    Allergies  Allergen Reactions   Codeine    Penicillins Hives   Sulfonamide Derivatives Nausea And Vomiting and Rash    Assessment/Plan:  1. Hyperlipidemia - Patient LDL 198 which is above goal of <70 and patient had elevated coronary calcium score.  Patient needs pharmacologic therapy and her best chance of success with be with PCSK9i inhibitor.  Using demo pen, educated patient on mechanism of action, storage, site selection, and administration.  Patient  was able to demonstrate in room.  Is willing to start. Will complete PA.  Repeat lipi panel scheudled for 2-3 months.  Karren Cobble, PharmD, BCACP, [place], [place] [address], [place], [place] [address] Phone: [phone]; Fax: [phone] 07/21/2020 10:48 AM

## 2020-07-25 DIAGNOSIS — E7800 Pure hypercholesterolemia, unspecified: Secondary | ICD-10-CM | POA: Diagnosis not present

## 2020-07-25 DIAGNOSIS — B351 Tinea unguium: Secondary | ICD-10-CM | POA: Diagnosis not present

## 2020-07-25 DIAGNOSIS — R635 Abnormal weight gain: Secondary | ICD-10-CM | POA: Diagnosis not present

## 2020-07-25 DIAGNOSIS — Z6824 Body mass index (BMI) 24.0-24.9, adult: Secondary | ICD-10-CM | POA: Diagnosis not present

## 2020-07-29 ENCOUNTER — Other Ambulatory Visit: Payer: Self-pay | Admitting: Allergy and Immunology

## 2020-07-30 ENCOUNTER — Encounter: Payer: Self-pay | Admitting: Family Medicine

## 2020-07-30 ENCOUNTER — Ambulatory Visit: Payer: Medicare HMO | Admitting: Family Medicine

## 2020-07-30 ENCOUNTER — Other Ambulatory Visit: Payer: Self-pay

## 2020-07-30 VITALS — BP 118/70 | HR 93 | Temp 98.3°F | Resp 16

## 2020-07-30 DIAGNOSIS — R059 Cough, unspecified: Secondary | ICD-10-CM | POA: Diagnosis not present

## 2020-07-30 DIAGNOSIS — K219 Gastro-esophageal reflux disease without esophagitis: Secondary | ICD-10-CM

## 2020-07-30 DIAGNOSIS — J3089 Other allergic rhinitis: Secondary | ICD-10-CM

## 2020-07-30 DIAGNOSIS — J3489 Other specified disorders of nose and nasal sinuses: Secondary | ICD-10-CM

## 2020-07-30 DIAGNOSIS — H04123 Dry eye syndrome of bilateral lacrimal glands: Secondary | ICD-10-CM

## 2020-07-30 NOTE — Patient Instructions (Addendum)
Chronic rhinitis Your skin testing to molds was negative at today's visit Continue Flonase 2 sprays in each nostril once a day as needed for stuffy nose Consider saline nasal rinses as needed for nasal symptoms. Use this before any medicated nasal sprays for best result  Reflux Continue to consolidate and eliminate all sources of caffeine Continue omeprazole 40 mg once or twice a day to control reflux Continue famotidine 40 mg once a day in the evening to control reflux  Dry eye Remain away from all antihistamines  Call the clinic if this treatment plan is not working well for you.  Follow up in 1 year or sooner if needed.

## 2020-07-30 NOTE — Progress Notes (Signed)
[place] [address] Dept: [phone]  FOLLOW UP NOTE  Patient ID: Grace Taylor, female    DOB: Aug 15, 1960  Age: 60 y.o. MRN: [id number] Date of Office Visit: 07/30/2020  Assessment  Chief Complaint: Allergy Testing  HPI Grace Taylor is a 60 year old female who presents the clinic for follow-up visit and allergy testing.  She was last seen in this clinic on 03/10/2020 by Dr. [name] for evaluation of allergic rhinitis, LPR, and chronic dry eye.  At today's visit, she reports that her house has sustained some damage from trees f falling and she is concerned for mold growth in her home.  She reports allergic rhinitis as moderately well controlled with symptoms including nasal congestion, occasional sneeze, and postnasal drainage with frequent throat clearing.  She does report infrequent dry cough.  She continues Flonase 2 sprays in each nostril once a day as needed and occasionally uses nasal saline rinses.  Reflux is reported as well controlled with no heartburn or vomiting.  She continues omeprazole 40 mg occasionally and she rarely takes famotidine.  She continues to experience symptoms of dry eye for which she uses an ophthalmic irrigation solution as needed.  She continues to avoid antihistamines.  Her current medications are listed in the chart.  She is interested in skin testing for allergy to molds at today's visit.  She is not interested in testing other environmental allergens at this time.   Drug Allergies:  Allergies  Allergen Reactions   Codeine    Crestor [Rosuvastatin]     INEFFECTIVE AT LOWERING LDL   Lipitor [Atorvastatin]     INEFFECTIVE AT LOWERING LDL   Penicillins Hives   Simvastatin     ELEVATED LIVER ENZYMES   Sulfonamide Derivatives Nausea And Vomiting and Rash    Physical Exam: BP 118/70   Pulse 93   Temp 98.3 F (36.8 C) (Temporal)   Resp 16   SpO2 99%    Physical Exam Vitals reviewed.  Constitutional:       Appearance: Normal appearance.  HENT:     Head: Normocephalic and atraumatic.     Right Ear: Tympanic membrane normal.     Left Ear: Tympanic membrane normal.     Nose:     Comments: Bilateral nares slightly erythematous with clear nasal drainage noted.  Pharynx normal.  Ears normal.  Eyes normal.    Mouth/Throat:     Pharynx: Oropharynx is clear.  Eyes:     Conjunctiva/sclera: Conjunctivae normal.  Cardiovascular:     Rate and Rhythm: Normal rate and regular rhythm.     Heart sounds: Normal heart sounds. No murmur heard. Pulmonary:     Effort: Pulmonary effort is normal.     Breath sounds: Normal breath sounds.     Comments: Lungs clear to auscultation Musculoskeletal:        General: Normal range of motion.     Cervical back: Normal range of motion and neck supple.  Skin:    General: Skin is warm and dry.  Neurological:     Mental Status: She is alert and oriented to person, place, and time.  Psychiatric:        Mood and Affect: Mood normal.        Behavior: Behavior normal.        Thought Content: Thought content normal.        Judgment: Judgment normal.    Diagnostics: Percutaneous mold panel negative with adequate controls  Intradermal mold panel  negative with adequate control  Assessment and Plan: 1. LPRD (laryngopharyngeal reflux disease)   2. Dry eye syndrome of both eyes   3. Perennial allergic rhinitis   4. Coughing   5. Nasal septal perforation      Patient Instructions  Chronic rhinitis Your skin testing to molds was negative at today's visit Continue Flonase 2 sprays in each nostril once a day as needed for stuffy nose Consider saline nasal rinses as needed for nasal symptoms. Use this before any medicated nasal sprays for best result  Reflux Continue to consolidate and eliminate all sources of caffeine Continue omeprazole 40 mg once or twice a day to control reflux Continue famotidine 40 mg once a day in the evening to control reflux  Dry  eye Remain away from all antihistamines  Call the clinic if this treatment plan is not working well for you.  Follow up in 1 year or sooner if needed.  Return in about 1 year (around 07/30/2021), or if symptoms worsen or fail to improve.    Thank you for the opportunity to care for this patient.  Please do not hesitate to contact me with questions.  Gareth Morgan, FNP All[facility] [place]

## 2020-08-12 ENCOUNTER — Ambulatory Visit: Payer: Medicare HMO | Admitting: Podiatry

## 2020-08-12 ENCOUNTER — Other Ambulatory Visit: Payer: Self-pay

## 2020-08-12 DIAGNOSIS — B351 Tinea unguium: Secondary | ICD-10-CM

## 2020-08-12 DIAGNOSIS — L603 Nail dystrophy: Secondary | ICD-10-CM | POA: Diagnosis not present

## 2020-08-14 NOTE — Progress Notes (Signed)
Subjective:  Patient ID: Grace Taylor, female    DOB: 1960-02-02,  MRN: [id number]  Chief Complaint  Patient presents with   Nail Problem    Pt has bilateral toenail fungus on her toes. Would like to find out the type of fungus she has/.     60 y.o. female presents with the above complaint.  Patient presents with concern for bilateral nail fungus.  She states that this has been going for quite some time.  She would like to know what type of fungus she has.  She is would like to discuss doing a nail biopsy of both the fungus.  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  She is tried Lamisil in the past twice has not helped.   Review of Systems: Negative except as noted in the HPI. Denies N/V/F/Ch.  Past Medical History:  Diagnosis Date   Allergy    Anemia    Benign colon polyp 04/07/2017   Q 5 year recall   Chronic neck pain - post injury, worker's comp 2013, 2018 04/07/2017   Dr. Mardelle Matte   Clostridium difficile infection    History of peptic ulcer 04/07/2017   Hyperlipidemia    IBS (irritable bowel syndrome) 04/07/2017   Colonoscopy by Carlean Purl   Migraines    PTSD (post-traumatic stress disorder) 04/07/2017   Ulcer    age 29    Current Outpatient Medications:    Alirocumab (PRALUENT) 150 MG/ML SOAJ, Inject 150 mg into the skin every 14 (fourteen) days., Disp: 2 mL, Rfl: 11   clonazePAM (KLONOPIN) 0.5 MG tablet, Take 1 tablet by mouth 2 (two) times daily., Disp: , Rfl: 1   famotidine (PEPCID) 40 MG tablet, TAKE 1 TABLET BY MOUTH EVERY DAY, Disp: 90 tablet, Rfl: 1   fluticasone (FLONASE) 50 MCG/ACT nasal spray, Place 2 sprays into both nostrils daily. , Disp: , Rfl:    magic mouthwash SOLN, Take 5 mLs by mouth as needed., Disp: , Rfl:    Multiple Vitamin (MULITIVITAMIN WITH MINERALS) TABS, Take 1 tablet by mouth daily., Disp: , Rfl:    NASAL SALINE NA, Place into the nose as needed., Disp: , Rfl:    Omega-3 1000 MG CAPS, Omega 3, Disp: , Rfl:    omeprazole (PRILOSEC) 40  MG capsule, Take 1 capsule (40 mg total) by mouth in the morning and at bedtime. (Patient taking differently: Take 40 mg by mouth 2 (two) times daily as needed.), Disp: 60 capsule, Rfl: 5   Ophthalmic Irrigation Solution (EYE [place] OP), Apply to eye as needed., Disp: , Rfl:    phentermine (ADIPEX-P) 37.5 MG tablet, Take 18.75-37.5 mg by mouth as needed., Disp: , Rfl:    zolpidem (AMBIEN) 10 MG tablet, Take 10 mg by mouth at bedtime. , Disp: , Rfl:   Social History   Tobacco Use  Smoking Status Never  Smokeless Tobacco Never    Allergies  Allergen Reactions   Codeine    Crestor [Rosuvastatin]     INEFFECTIVE AT LOWERING LDL   Lipitor [Atorvastatin]     INEFFECTIVE AT LOWERING LDL   Penicillins Hives   Simvastatin     ELEVATED LIVER ENZYMES   Sulfonamide Derivatives Nausea And Vomiting and Rash   Objective:  There were no vitals filed for this visit. There is no height or weight on file to calculate BMI. Constitutional Well developed. Well nourished.  Vascular Dorsalis pedis pulses palpable bilaterally. Posterior tibial pulses palpable bilaterally. Capillary refill normal to all digits.  No cyanosis or clubbing noted. Pedal hair growth normal.  Neurologic Normal speech. Oriented to person, place, and time. Epicritic sensation to light touch grossly present bilaterally.  Dermatologic Bilateral hallux thickened elongated dystrophic toenails noted mild pain on palpation  Orthopedic: Normal joint ROM without pain or crepitus bilaterally. No visible deformities. No bony tenderness.   Radiographs: None Assessment:   1. Nail fungus   2. Onychomycosis due to dermatophyte    Plan:  Patient was evaluated and treated and all questions answered.  Bilateral hallux onychomycosis x2 -Educated the patient on the etiology of onychomycosis and various treatment options associated with improving the fungal load.  I explained to the patient that there is 3 treatment options available to  treat the onychomycosis including topical, p.o., laser treatment.  However I believe patient may benefit from doing a biopsy of the nail.  The biopsy was obtained in standard technique of bilateral hallux and sent to pathology.  We will await results and discuss treatment options afterwards.   No follow-ups on file.

## 2020-08-17 DIAGNOSIS — R69 Illness, unspecified: Secondary | ICD-10-CM | POA: Diagnosis not present

## 2020-08-19 ENCOUNTER — Other Ambulatory Visit: Payer: Self-pay | Admitting: Allergy and Immunology

## 2020-08-22 DIAGNOSIS — E7800 Pure hypercholesterolemia, unspecified: Secondary | ICD-10-CM | POA: Diagnosis not present

## 2020-08-22 DIAGNOSIS — B351 Tinea unguium: Secondary | ICD-10-CM | POA: Diagnosis not present

## 2020-08-22 DIAGNOSIS — R635 Abnormal weight gain: Secondary | ICD-10-CM | POA: Diagnosis not present

## 2020-08-22 DIAGNOSIS — Z6824 Body mass index (BMI) 24.0-24.9, adult: Secondary | ICD-10-CM | POA: Diagnosis not present

## 2020-09-15 ENCOUNTER — Other Ambulatory Visit: Payer: Self-pay

## 2020-09-15 ENCOUNTER — Encounter: Payer: Self-pay | Admitting: Allergy and Immunology

## 2020-09-15 ENCOUNTER — Ambulatory Visit: Payer: Medicare HMO | Admitting: Allergy and Immunology

## 2020-09-15 VITALS — BP 136/84 | HR 86 | Temp 98.8°F | Resp 18 | Ht 66.0 in | Wt 158.1 lb

## 2020-09-15 DIAGNOSIS — J3489 Other specified disorders of nose and nasal sinuses: Secondary | ICD-10-CM

## 2020-09-15 DIAGNOSIS — K219 Gastro-esophageal reflux disease without esophagitis: Secondary | ICD-10-CM | POA: Diagnosis not present

## 2020-09-15 DIAGNOSIS — H04123 Dry eye syndrome of bilateral lacrimal glands: Secondary | ICD-10-CM

## 2020-09-15 DIAGNOSIS — J3089 Other allergic rhinitis: Secondary | ICD-10-CM

## 2020-09-15 MED ORDER — OMEPRAZOLE 40 MG PO CPDR: 40.0000 mg | DELAYED_RELEASE_CAPSULE | Freq: Two times a day (BID) | ORAL | 3 refills | Status: DC

## 2020-09-15 MED ORDER — FAMOTIDINE 40 MG PO TABS
40.0000 mg | ORAL_TABLET | Freq: Every evening | ORAL | 3 refills | Status: DC
Start: 2020-09-15 — End: 2021-06-10

## 2020-09-15 NOTE — Patient Instructions (Addendum)
  1.  Continue to Treat and prevent reflux:   A.  Minimize all forms of caffeine consumption  B.  Omeprazole 40 mg -1 tablet 1-2 times a day  C.  Famotidine 40 mg -1 tablet in evening  2.  Can use nasal fluticasone - 1-2 sprays each nostril 3-7 times per week  3.  Return to clinic in 12 months or earlier if problem.   4. Obtain fall flu vaccine

## 2020-09-15 NOTE — Progress Notes (Signed)
[facility] [place] [facility] [place]   Follow-up Note  Referring Provider: Nickola Major, MD Primary Provider: [facility] Date of Office Visit: 09/15/2020  Subjective:   Grace Taylor (DOB: [birth year]) is a 60 y.o. female who returns to the [place] on 09/15/2020 in re-evaluation of the following:  HPI: Grace Taylor returns to this clinic in evaluation of LPR, allergic rhinitis, and dry eye syndrome.  Her last visit with me in this clinic was 10 March 2020.  She has really doing well while consistently using aggressive therapy directed against LPR.  She has had very little problems with her throat and very little problems with swallowing and overall is very happy about this issue.  Her nose is doing very well while using a nasal steroid on a pretty consistent basis.  She attempts to remain away from use of antihistamines because of her dry eye syndrome but occasionally she will use a Benadryl for sleep.  She was stung on her buttocks by some type of flying insect a week or so ago and it got pretty red and swollen but that has since decreased dramatically.  She never had any associated systemic reaction.  Allergies as of 09/15/2020       Reactions   Codeine    Crestor [rosuvastatin]    INEFFECTIVE AT LOWERING LDL   Lipitor [atorvastatin]    INEFFECTIVE AT LOWERING LDL   Penicillins Hives   Simvastatin    ELEVATED LIVER ENZYMES   Sulfonamide Derivatives Nausea And Vomiting, Rash        Medication List    clonazePAM 0.5 MG tablet Commonly known as: KLONOPIN Take 1 tablet by mouth 2 (two) times daily.   EYE [place] OP Apply to eye as needed.   famotidine 40 MG tablet Commonly known as: PEPCID TAKE 1 TABLET BY MOUTH EVERY DAY   fluticasone 50 MCG/ACT nasal spray Commonly known as: FLONASE Place 2 sprays into both nostrils daily.   magic mouthwash Soln Take 5 mLs by mouth as needed.   multivitamin with  minerals Tabs tablet Take 1 tablet by mouth daily.   NASAL SALINE NA Place into the nose as needed.   Omega-3 1000 MG Caps Omega 3   omeprazole 40 MG capsule Commonly known as: PRILOSEC TAKE 1 CAPSULE (40 MG TOTAL) BY MOUTH IN THE MORNING AND AT BEDTIME.   phentermine 37.5 MG tablet Commonly known as: ADIPEX-P Take 18.75-37.5 mg by mouth as needed.   Praluent 150 MG/ML [place] Generic drug: Alirocumab Inject 150 mg into the skin every 14 (fourteen) days.   zolpidem 10 MG tablet Commonly known as: AMBIEN Take 10 mg by mouth at bedtime.    Past Medical History:  Diagnosis Date   Allergy    Anemia    Benign colon polyp 04/07/2017   Q 5 year recall   Chronic neck pain - post injury, worker's comp 2013, 2018 04/07/2017   Dr. Mardelle Matte   Clostridium difficile infection    History of peptic ulcer 04/07/2017   Hyperlipidemia    IBS (irritable bowel syndrome) 04/07/2017   Colonoscopy by Carlean Purl   Migraines    PTSD (post-traumatic stress disorder) 04/07/2017   Ulcer    age 34    Past Surgical History:  Procedure Laterality Date   CHOLECYSTECTOMY  2012   COLONOSCOPY  2014   DG GALL BLADDER     2012   ESOPHAGOGASTRODUODENOSCOPY  2014   FLEXIBLE SIGMOIDOSCOPY  03/2014  OVARIAN CYST REMOVAL     right shoulder surgery     TMJ ARTHROSCOPY      Review of systems negative except as noted in HPI / PMHx or noted below:  Review of Systems  Constitutional: Negative.   HENT: Negative.    Eyes: Negative.   Respiratory: Negative.    Cardiovascular: Negative.   Gastrointestinal: Negative.   Genitourinary: Negative.   Musculoskeletal: Negative.   Skin: Negative.   Neurological: Negative.   Endo/Heme/Allergies: Negative.   Psychiatric/Behavioral: Negative.      Objective:   Vitals:   09/15/20 1209  BP: 136/84  Pulse: 86  Resp: 18  Temp: 98.8 F (37.1 C)  SpO2: 97%   Height: '5\' 6"'$  (167.6 cm)  Weight: 158 lb 2 oz (71.7 kg)   Physical Exam Constitutional:      Appearance:  She is not diaphoretic.  HENT:     Head: Normocephalic.     Right Ear: Tympanic membrane, ear canal and external ear normal.     Left Ear: Tympanic membrane, ear canal and external ear normal.     Nose: Septal deviation present. No mucosal edema (Septal perforation with clear borders) or rhinorrhea.     Mouth/Throat:     Pharynx: Uvula midline. No oropharyngeal exudate.  Eyes:     Conjunctiva/sclera: Conjunctivae normal.  Neck:     Thyroid: No thyromegaly.     Trachea: Trachea normal. No tracheal tenderness or tracheal deviation.  Cardiovascular:     Rate and Rhythm: Normal rate and regular rhythm.     Heart sounds: Normal heart sounds, S1 normal and S2 normal. No murmur heard. Pulmonary:     Effort: No respiratory distress.     Breath sounds: Normal breath sounds. No stridor. No wheezing or rales.  Lymphadenopathy:     Head:     Right side of head: No tonsillar adenopathy.     Left side of head: No tonsillar adenopathy.     Cervical: No cervical adenopathy.  Skin:    Findings: No erythema or rash.     Nails: There is no clubbing.  Neurological:     Mental Status: She is alert.    Diagnostics: none  Assessment and Plan:   1. LPRD (laryngopharyngeal reflux disease)   2. Dry eye syndrome of both eyes   3. Perennial allergic rhinitis   4. Nasal septal perforation     1.  Continue to Treat and prevent reflux:   A.  Minimize all forms of caffeine consumption  B.  Omeprazole 40 mg -1 tablet 1-2 times a day  C.  Famotidine 40 mg -1 tablet in evening  2.  Can use nasal fluticasone - 1-2 sprays each nostril 3-7 times per week  3.  Return to clinic in 12 months or earlier if problem.   4. Obtain fall flu vaccine  Grace Taylor appears to be doing very well and we are going to continue to have her use aggressive therapy directed against reflux and some anti-inflammatory medications for her airway.  She needs to remain away from the use of antihistamines secondary to her dry eye  syndrome.  I will see her back in this clinic in 1 year or earlier if there is a problem.  Allena Katz, MD Allergy / Immunology [place]

## 2020-09-16 ENCOUNTER — Ambulatory Visit: Payer: Medicare HMO | Admitting: Podiatry

## 2020-09-16 ENCOUNTER — Encounter: Payer: Self-pay | Admitting: Allergy and Immunology

## 2020-09-16 DIAGNOSIS — L603 Nail dystrophy: Secondary | ICD-10-CM | POA: Diagnosis not present

## 2020-09-17 DIAGNOSIS — F4323 Adjustment disorder with mixed anxiety and depressed mood: Secondary | ICD-10-CM | POA: Diagnosis not present

## 2020-09-17 DIAGNOSIS — R69 Illness, unspecified: Secondary | ICD-10-CM | POA: Diagnosis not present

## 2020-09-18 NOTE — Progress Notes (Signed)
Subjective:  Patient ID: Grace Taylor, female    DOB: Jan 01, 1961,  MRN: [id number]  Chief Complaint  Patient presents with   Nail Problem    Nail fungus     60 y.o. female presents with the above complaint.  Patient presents with concern for bilateral dystrophic nail.  She is here to go over her results.  She states that she denies any other acute complaint no pain to the nails.   Review of Systems: Negative except as noted in the HPI. Denies N/V/F/Ch.  Past Medical History:  Diagnosis Date   Allergy    Anemia    Benign colon polyp 04/07/2017   Q 5 year recall   Chronic neck pain - post injury, worker's comp 2013, 2018 04/07/2017   Dr. Mardelle Matte   Clostridium difficile infection    History of peptic ulcer 04/07/2017   Hyperlipidemia    IBS (irritable bowel syndrome) 04/07/2017   Colonoscopy by Carlean Purl   Migraines    PTSD (post-traumatic stress disorder) 04/07/2017   Ulcer    age 8    Current Outpatient Medications:    Alirocumab (PRALUENT) 150 MG/ML SOAJ, Inject 150 mg into the skin every 14 (fourteen) days., Disp: 2 mL, Rfl: 11   clonazePAM (KLONOPIN) 0.5 MG tablet, Take 1 tablet by mouth 2 (two) times daily., Disp: , Rfl: 1   famotidine (PEPCID) 40 MG tablet, Take 1 tablet (40 mg total) by mouth at bedtime., Disp: 90 tablet, Rfl: 3   fluticasone (FLONASE) 50 MCG/ACT nasal spray, Place 2 sprays into both nostrils daily. , Disp: , Rfl:    magic mouthwash SOLN, Take 5 mLs by mouth as needed., Disp: , Rfl:    Multiple Vitamin (MULITIVITAMIN WITH MINERALS) TABS, Take 1 tablet by mouth daily., Disp: , Rfl:    NASAL SALINE NA, Place into the nose as needed., Disp: , Rfl:    Omega-3 1000 MG CAPS, Omega 3, Disp: , Rfl:    omeprazole (PRILOSEC) 40 MG capsule, Take 1 capsule (40 mg total) by mouth in the morning and at bedtime., Disp: 180 capsule, Rfl: 3   Ophthalmic Irrigation Solution (EYE [place] OP), Apply to eye as needed., Disp: , Rfl:    phentermine (ADIPEX-P) 37.5 MG tablet,  Take 18.75-37.5 mg by mouth as needed., Disp: , Rfl:    zolpidem (AMBIEN) 10 MG tablet, Take 10 mg by mouth at bedtime. , Disp: , Rfl:   Social History   Tobacco Use  Smoking Status Never  Smokeless Tobacco Never    Allergies  Allergen Reactions   Codeine    Crestor [Rosuvastatin]     INEFFECTIVE AT LOWERING LDL   Lipitor [Atorvastatin]     INEFFECTIVE AT LOWERING LDL   Penicillins Hives   Simvastatin     ELEVATED LIVER ENZYMES   Sulfonamide Derivatives Nausea And Vomiting and Rash   Objective:  There were no vitals filed for this visit. There is no height or weight on file to calculate BMI. Constitutional Well developed. Well nourished.  Vascular Dorsalis pedis pulses palpable bilaterally. Posterior tibial pulses palpable bilaterally. Capillary refill normal to all digits.  No cyanosis or clubbing noted. Pedal hair growth normal.  Neurologic Normal speech. Oriented to person, place, and time. Epicritic sensation to light touch grossly present bilaterally.  Dermatologic Bilateral hallux thickened elongated dystrophic toenails noted mild pain on palpation  Orthopedic: Normal joint ROM without pain or crepitus bilaterally. No visible deformities. No bony tenderness.   Radiographs: None Assessment:   1.  Nail dystrophy     Plan:  Patient was evaluated and treated and all questions answered.  Bilateral hallux onychomycosis x2 -Biopsy was reviewed with the patient in extensive detail.  Patient does not have any fungal component to it primarily just trauma to the area.  At this time I discussed with her that I can give her the option of removing the nail if it is very bothersome and painful however patient would like to hold off on that for now.  I discussed with her that in the future if he has bothersome come back and see me.  She states understanding.   No follow-ups on file.

## 2020-09-19 DIAGNOSIS — Z6824 Body mass index (BMI) 24.0-24.9, adult: Secondary | ICD-10-CM | POA: Diagnosis not present

## 2020-09-19 DIAGNOSIS — R635 Abnormal weight gain: Secondary | ICD-10-CM | POA: Diagnosis not present

## 2020-09-19 DIAGNOSIS — E7800 Pure hypercholesterolemia, unspecified: Secondary | ICD-10-CM | POA: Diagnosis not present

## 2020-10-14 DIAGNOSIS — R69 Illness, unspecified: Secondary | ICD-10-CM | POA: Diagnosis not present

## 2020-10-17 DIAGNOSIS — Z6824 Body mass index (BMI) 24.0-24.9, adult: Secondary | ICD-10-CM | POA: Diagnosis not present

## 2020-10-17 DIAGNOSIS — E7800 Pure hypercholesterolemia, unspecified: Secondary | ICD-10-CM | POA: Diagnosis not present

## 2020-10-17 DIAGNOSIS — R635 Abnormal weight gain: Secondary | ICD-10-CM | POA: Diagnosis not present

## 2020-11-15 DIAGNOSIS — Z79899 Other long term (current) drug therapy: Secondary | ICD-10-CM | POA: Diagnosis not present

## 2020-11-15 DIAGNOSIS — Z131 Encounter for screening for diabetes mellitus: Secondary | ICD-10-CM | POA: Diagnosis not present

## 2020-11-15 DIAGNOSIS — E7800 Pure hypercholesterolemia, unspecified: Secondary | ICD-10-CM | POA: Diagnosis not present

## 2020-11-15 DIAGNOSIS — D539 Nutritional anemia, unspecified: Secondary | ICD-10-CM | POA: Diagnosis not present

## 2020-11-15 DIAGNOSIS — R5383 Other fatigue: Secondary | ICD-10-CM | POA: Diagnosis not present

## 2020-11-15 DIAGNOSIS — E559 Vitamin D deficiency, unspecified: Secondary | ICD-10-CM | POA: Diagnosis not present

## 2020-11-15 DIAGNOSIS — R635 Abnormal weight gain: Secondary | ICD-10-CM | POA: Diagnosis not present

## 2020-11-23 DIAGNOSIS — F4323 Adjustment disorder with mixed anxiety and depressed mood: Secondary | ICD-10-CM | POA: Diagnosis not present

## 2020-11-23 DIAGNOSIS — R69 Illness, unspecified: Secondary | ICD-10-CM | POA: Diagnosis not present

## 2020-12-07 DIAGNOSIS — R69 Illness, unspecified: Secondary | ICD-10-CM | POA: Diagnosis not present

## 2020-12-07 DIAGNOSIS — F4323 Adjustment disorder with mixed anxiety and depressed mood: Secondary | ICD-10-CM | POA: Diagnosis not present

## 2020-12-22 DIAGNOSIS — F4323 Adjustment disorder with mixed anxiety and depressed mood: Secondary | ICD-10-CM | POA: Diagnosis not present

## 2020-12-22 DIAGNOSIS — R69 Illness, unspecified: Secondary | ICD-10-CM | POA: Diagnosis not present

## 2021-01-06 DIAGNOSIS — H5213 Myopia, bilateral: Secondary | ICD-10-CM | POA: Diagnosis not present

## 2021-01-06 DIAGNOSIS — H524 Presbyopia: Secondary | ICD-10-CM | POA: Diagnosis not present

## 2021-01-06 DIAGNOSIS — H2513 Age-related nuclear cataract, bilateral: Secondary | ICD-10-CM | POA: Diagnosis not present

## 2021-01-11 ENCOUNTER — Ambulatory Visit
Admission: RE | Admit: 2021-01-11 | Discharge: 2021-01-11 | Disposition: A | Discharge status: Home / Self Care | Payer: Medicare HMO | Source: Ambulatory Visit | Attending: Cardiology | Admitting: Cardiology

## 2021-01-11 ENCOUNTER — Other Ambulatory Visit: Payer: Self-pay | Admitting: Cardiology

## 2021-01-11 ENCOUNTER — Other Ambulatory Visit: Payer: Self-pay

## 2021-01-11 DIAGNOSIS — Z1231 Encounter for screening mammogram for malignant neoplasm of breast: Secondary | ICD-10-CM | POA: Diagnosis not present

## 2021-01-11 NOTE — Progress Notes (Signed)
61 y.o. G59P0020 Divorced White or Caucasian Not Hispanic or Latino female here for annual exam.  She is engaged (met 3 months ago). They are not sexually active yet. No prior issues with dyspareunia.  No vaginal bleeding.  Intermittent issues with constipation in the last few months. She is having a small BM daily, only having a "real" BM 2-3 days a week. No dietary changes other than recently eating more chicken or fish.   No bladder c/o.     No LMP recorded. Patient is postmenopausal.          Sexually active: No.  The current method of family planning is post menopausal status.    Exercising: Yes.     Walking  Smoker:  no  Health Maintenance: Pap:  01/02/20 WNL 08/10/16 WNL  History of abnormal Pap:  no MMG:  01/11/21 density B Bi-rads 1 neg  BMD:   couple of years ago was normal  Colonoscopy: 10/08/15 polyp f/u 7 years  TDaP:  05/07/14  Gardasil: n/a   reports that she has never smoked. She has never used smokeless tobacco. She reports that she does not currently use alcohol. She reports that she does not use drugs. Retired from the [employer].   Past Medical History:  Diagnosis Date   Allergy    Anemia    Benign colon polyp 04/07/2017   Q 5 year recall   Chronic neck pain - post injury, worker's comp 2013, 2018 04/07/2017   Dr. Mardelle Matte   Clostridium difficile infection    History of peptic ulcer 04/07/2017   Hyperlipidemia    IBS (irritable bowel syndrome) 04/07/2017   Colonoscopy by Carlean Purl   Migraines    PTSD (post-traumatic stress disorder) 04/07/2017   Ulcer    age 47    Past Surgical History:  Procedure Laterality Date   CHOLECYSTECTOMY  2012   COLONOSCOPY  2014   DG GALL BLADDER     2012   ESOPHAGOGASTRODUODENOSCOPY  2014   FLEXIBLE SIGMOIDOSCOPY  03/2014   OVARIAN CYST REMOVAL     right shoulder surgery     TMJ ARTHROSCOPY      Current Outpatient Medications  Medication Sig Dispense Refill   Alirocumab (PRALUENT) 150 MG/ML SOAJ Inject 150 mg into the skin every  14 (fourteen) days. 2 mL 11   clonazePAM (KLONOPIN) 0.5 MG tablet Take 1 tablet by mouth 2 (two) times daily.  1   famotidine (PEPCID) 40 MG tablet Take 1 tablet (40 mg total) by mouth at bedtime. 90 tablet 3   fluticasone (FLONASE) 50 MCG/ACT nasal spray Place 2 sprays into both nostrils daily.      magic mouthwash SOLN Take 5 mLs by mouth as needed.     Multiple Vitamin (MULITIVITAMIN WITH MINERALS) TABS Take 1 tablet by mouth daily.     NASAL SALINE NA Place into the nose as needed.     Omega-3 1000 MG CAPS Omega 3     omeprazole (PRILOSEC) 40 MG capsule Take 1 capsule (40 mg total) by mouth in the morning and at bedtime. 180 capsule 3   Ophthalmic Irrigation Solution (EYE [place] OP) Apply to eye as needed.     phentermine (ADIPEX-P) 37.5 MG tablet Take 18.75-37.5 mg by mouth as needed.     zolpidem (AMBIEN) 10 MG tablet Take 10 mg by mouth at bedtime.      No current facility-administered medications for this visit.    Family History  Problem Relation Age of Onset  Hypertension Mother    Alcohol abuse Mother    Arthritis Mother    Cancer Mother    Heart disease Mother    Kidney disease Mother    Mental illness Mother    Hypertension Father    CAD Father 71       CABG    Hyperlipidemia Father    Heart disease Father    Early death Father    Heart attack Father    Hypertension Brother    Alcohol abuse Brother    Heart disease Brother    Breast cancer Maternal Aunt    Cancer Maternal Aunt        breast cancer   Hypertension Sister    Hyperlipidemia Sister    Arthritis Sister     Review of Systems  All other systems reviewed and are negative.  Exam:   BP 122/62    Pulse 85    Ht 5' 6"  (1.676 m)    Wt 153 lb (69.4 kg)    SpO2 (!) 88%    BMI 24.69 kg/m   Weight change: @WEIGHTCHANGE @ Height:   Height: 5' 6"  (167.6 cm)  Ht Readings from Last 3 Encounters:  01/18/21 5' 6"  (1.676 m)  09/15/20 5' 6"  (1.676 m)  07/21/20 5' 6"  (1.676 m)    General appearance: alert,  cooperative and appears stated age Head: Normocephalic, without obvious abnormality, atraumatic Neck: no adenopathy, supple, symmetrical, trachea midline and thyroid normal to inspection and palpation Breasts: normal appearance, no masses or tenderness Abdomen: soft, non-tender; non distended,  no masses,  no organomegaly Extremities: extremities normal, atraumatic, no cyanosis or edema Skin: Skin color, texture, turgor normal. No rashes or lesions Lymph nodes: Cervical, supraclavicular, and axillary nodes normal. No abnormal inguinal nodes palpated Neurologic: Grossly normal   Pelvic: External genitalia:  no lesions              Urethra:  normal appearing urethra with no masses, tenderness or lesions              Bartholins and Skenes: normal                 Vagina: atrophic appearing vagina with normal color and discharge, no lesions              Cervix: no lesions               Bimanual Exam:  Uterus:  normal size, contour, position, consistency, mobility, non-tender              Adnexa: no mass, fullness, tenderness               Rectovaginal: Confirms               Anus:  normal sphincter tone, no lesions  Gae Dry chaperoned for the exam.  1. Gynecologic exam normal Mammogram, pap and colonoscopy are UTD Mild atrophy, discussed use of vaginal lubrication and option for vaginal estrogen -F/U breast and pelvic in 2 year  2. Constipation, unspecified constipation type Discussed fruit, fiber, fluid.  Try Magnesium, up to 500 mg a day If not better recommended that she call Dr Leroy Kennedy.

## 2021-01-18 ENCOUNTER — Encounter: Payer: Self-pay | Admitting: Obstetrics and Gynecology

## 2021-01-18 ENCOUNTER — Ambulatory Visit (INDEPENDENT_AMBULATORY_CARE_PROVIDER_SITE_OTHER): Payer: Medicare HMO | Admitting: Obstetrics and Gynecology

## 2021-01-18 ENCOUNTER — Other Ambulatory Visit: Payer: Self-pay

## 2021-01-18 VITALS — BP 122/62 | HR 85 | Ht 66.0 in | Wt 153.0 lb

## 2021-01-18 DIAGNOSIS — Z01419 Encounter for gynecological examination (general) (routine) without abnormal findings: Secondary | ICD-10-CM

## 2021-01-18 DIAGNOSIS — K5900 Constipation, unspecified: Secondary | ICD-10-CM

## 2021-01-18 NOTE — Patient Instructions (Addendum)
Try Uberlube for vaginal lubrication (buy it on line)  You can try up to 500 mg a day of magnesium for constipation.  About Constipation  Constipation Overview Constipation is the most common gastrointestinal complaint -- about 4 million Americans experience constipation and make 2.5 million physician visits a year to get help for the problem.  Constipation can occur when the colon absorbs too much water, the colons muscle contraction is slow or sluggish, and/or there is delayed transit time through the colon.  The result is stool that is hard and dry.  Indicators of constipation include straining during bowel movements greater than 25% of the time, having fewer than three bowel movements per week, and/or the feeling of incomplete evacuation.  There are established guidelines ([place] II ) for defining constipation. A person needs to have two or more of the following symptoms for at least 12 weeks (not necessarily consecutive) in the preceding 12 months: Straining in  greater than 25% of bowel movements Lumpy or hard stools in greater than 25% of bowel movements Sensation of incomplete emptying in greater than 25% of bowel movements Sensation of anorectal obstruction/blockade in greater than 25% of bowel movements Manual maneuvers to help empty greater than 25% of bowel movements (e.g., digital evacuation, support of the pelvic floor)  Less than  3 bowel movements/week Loose stools are not present, and criteria for irritable bowel syndrome are insufficient  Common Causes of Constipation Lack of fiber in your diet Lack of physical activity Medications, including iron and calcium supplements  Dairy intake Dehydration Abuse of laxatives Travel Irritable Bowel Syndrome Pregnancy Luteal phase of menstruation (after ovulation and before menses) Colorectal problems Intestinal Dysfunction  Treating Constipation  There are several ways of treating constipation, including changes to diet and  exercise, use of laxatives, adjustments to the pelvic floor, and scheduled toileting.  These treatments include: increasing fiber and fluids in the diet  increasing physical activity learning muscle coordination  learning proper toileting techniques and toileting modifications  designing and sticking  to a toileting schedule     2007, [employer] Doc.22  EXERCISE   We recommended that you start or continue a regular exercise program for good health. Physical activity is anything that gets your body moving, some is better than none. The CDC recommends 150 minutes per week of Moderate-Intensity Aerobic Activity and 2 or more days of Muscle Strengthening Activity.  Benefits of exercise are limitless: helps weight loss/weight maintenance, improves mood and energy, helps with depression and anxiety, improves sleep, tones and strengthens muscles, improves balance, improves bone density, protects from chronic conditions such as heart disease, high blood pressure and diabetes and so much more. To learn more visit: WhyNotPoker.uy  DIET: Good nutrition starts with a healthy diet of fruits, vegetables, whole grains, and lean protein sources. Drink plenty of water for hydration. Minimize empty calories, sodium, sweets. For more information about dietary recommendations visit: GeekRegister.com.ee and http://schaefer-mitchell.com/  ALCOHOL:  Women should limit their alcohol intake to no more than 7 drinks/beers/glasses of wine (combined, not each!) per week. Moderation of alcohol intake to this level decreases your risk of breast cancer and liver damage.  If you are concerned that you may have a problem, or your friends have told you they are concerned about your drinking, there are many resources to help. A well-known program that is free, effective, and available to all people all over the nation is Alcohol[employer]  Anonymous.  Check out this site to learn more: BlockTaxes.se  CALCIUM AND VITAMIN D:  Adequate intake of calcium and Vitamin D are recommended for bone health.  You should be getting between 1000-1200 mg of calcium and 800 units of Vitamin D daily between diet and supplements  PAP SMEARS:  Pap smears, to check for cervical cancer or precancers,  have traditionally been done yearly, scientific advances have shown that most women can have pap smears less often.  However, every woman still should have a physical exam from her gynecologist every year. It will include a breast check, inspection of the vulva and vagina to check for abnormal growths or skin changes, a visual exam of the cervix, and then an exam to evaluate the size and shape of the uterus and ovaries. We will also provide age appropriate advice regarding health maintenance, like when you should have certain vaccines, screening for sexually transmitted diseases, bone density testing, colonoscopy, mammograms, etc.   MAMMOGRAMS:  All women over 10 years old should have a routine mammogram.   COLON CANCER SCREENING: Now recommend starting at age 31. At this time colonoscopy is not covered for routine screening until 50. There are take home tests that can be done between 45-49.   COLONOSCOPY:  Colonoscopy to screen for colon cancer is recommended for all women at age 58.  We know, you hate the idea of the prep.  We agree, BUT, having colon cancer and not knowing it is worse!!  Colon cancer so often starts as a polyp that can be seen and removed at colonscopy, which can quite literally save your life!  And if your first colonoscopy is normal and you have no family history of colon cancer, most women don't have to have it again for 10 years.  Once every ten years, you can do something that may end up saving your life, right?  We will be happy to help you get it scheduled when you are ready.  Be sure to check your insurance coverage so you  understand how much it will cost.  It may be covered as a preventative service at no cost, but you should check your particular policy.      Breast Self-Awareness Breast self-awareness means being familiar with how your breasts look and feel. It involves checking your breasts regularly and reporting any changes to your health care provider. Practicing breast self-awareness is important. A change in your breasts can be a sign of a serious medical problem. Being familiar with how your breasts look and feel allows you to find any problems early, when treatment is more likely to be successful. All women should practice breast self-awareness, including women who have had breast implants. How to do a breast self-exam One way to learn what is normal for your breasts and whether your breasts are changing is to do a breast self-exam. To do a breast self-exam: Look for Changes  Remove all the clothing above your waist. Stand in front of a mirror in a room with good lighting. Put your hands on your hips. Push your hands firmly downward. Compare your breasts in the mirror. Look for differences between them (asymmetry), such as: Differences in shape. Differences in size. Puckers, dips, and bumps in one breast and not the other. Look at each breast for changes in your skin, such as: Redness. Scaly areas. Look for changes in your nipples, such as: Discharge. Bleeding. Dimpling. Redness. A change in position. Feel for Changes Carefully feel your breasts for lumps and changes. It is best to do this while  lying on your back on the floor and again while sitting or standing in the shower or tub with soapy water on your skin. Feel each breast in the following way: Place the arm on the side of the breast you are examining above your head. Feel your breast with the other hand. Start in the nipple area and make  inch (2 cm) overlapping circles to feel your breast. Use the pads of your three middle fingers  to do this. Apply light pressure, then medium pressure, then firm pressure. The light pressure will allow you to feel the tissue closest to the skin. The medium pressure will allow you to feel the tissue that is a little deeper. The firm pressure will allow you to feel the tissue close to the ribs. Continue the overlapping circles, moving downward over the breast until you feel your ribs below your breast. Move one finger-width toward the center of the body. Continue to use the  inch (2 cm) overlapping circles to feel your breast as you move slowly up toward your collarbone. Continue the up and down exam using all three pressures until you reach your armpit.  Write Down What You Find  Write down what is normal for each breast and any changes that you find. Keep a written record with breast changes or normal findings for each breast. By writing this information down, you do not need to depend only on memory for size, tenderness, or location. Write down where you are in your menstrual cycle, if you are still menstruating. If you are having trouble noticing differences in your breasts, do not get discouraged. With time you will become more familiar with the variations in your breasts and more comfortable with the exam. How often should I examine my breasts? Examine your breasts every month. If you are breastfeeding, the best time to examine your breasts is after a feeding or after using a breast pump. If you menstruate, the best time to examine your breasts is 5-7 days after your period is over. During your period, your breasts are lumpier, and it may be more difficult to notice changes. When should I see my health care provider? See your health care provider if you notice: A change in shape or size of your breasts or nipples. A change in the skin of your breast or nipples, such as a reddened or scaly area. Unusual discharge from your nipples. A lump or thick area that was not there before. Pain in  your breasts. Anything that concerns you.

## 2021-01-21 ENCOUNTER — Encounter: Payer: Self-pay | Admitting: Internal Medicine

## 2021-01-27 DIAGNOSIS — R69 Illness, unspecified: Secondary | ICD-10-CM | POA: Diagnosis not present

## 2021-01-27 DIAGNOSIS — F4323 Adjustment disorder with mixed anxiety and depressed mood: Secondary | ICD-10-CM | POA: Diagnosis not present

## 2021-03-02 DIAGNOSIS — F4323 Adjustment disorder with mixed anxiety and depressed mood: Secondary | ICD-10-CM | POA: Diagnosis not present

## 2021-03-02 DIAGNOSIS — R69 Illness, unspecified: Secondary | ICD-10-CM | POA: Diagnosis not present

## 2021-03-19 DIAGNOSIS — R69 Illness, unspecified: Secondary | ICD-10-CM | POA: Diagnosis not present

## 2021-03-19 DIAGNOSIS — F4323 Adjustment disorder with mixed anxiety and depressed mood: Secondary | ICD-10-CM | POA: Diagnosis not present

## 2021-04-20 ENCOUNTER — Ambulatory Visit: Payer: Medicare HMO | Admitting: Orthopaedic Surgery

## 2021-04-20 ENCOUNTER — Encounter: Payer: Self-pay | Admitting: Orthopaedic Surgery

## 2021-04-20 VITALS — BP 171/109 | Ht 66.0 in | Wt 153.0 lb

## 2021-04-20 DIAGNOSIS — M4722 Other spondylosis with radiculopathy, cervical region: Secondary | ICD-10-CM | POA: Diagnosis not present

## 2021-04-20 DIAGNOSIS — M542 Cervicalgia: Secondary | ICD-10-CM

## 2021-04-20 DIAGNOSIS — M4802 Spinal stenosis, cervical region: Secondary | ICD-10-CM

## 2021-04-20 NOTE — Progress Notes (Signed)
? ?Office Visit Note ?  ?Patient: Grace Taylor           ?Date of Birth: 1960/10/13           ?MRN: [id number] ?Visit Date: 04/20/2021 ?             ?Requested by: [facility] ?[address] ?[place],  [place] [address] ?PCP: [facility] ? ? ?Assessment & Plan: ?Visit Diagnoses:  ?1. Cervicalgia   ?2. Other spondylosis with radiculopathy, cervical region   ?3. Foraminal stenosis of cervical region   ? ? ?Plan: will proceed with MRI scan cervical for her progressive spondylosis and foraminal stenosis at C5-6, C6-7.  Patient has had NSAIDs, therapy in the past , Home exercise program with persistant symptoms. MRI scan from 2020 reviewed with her . We answered multiple questions about pathophysiology of cervical foraminal stenosis and surgical options available to help her pain.  ? ?Follow-Up Instructions: No follow-ups on file.  ? ?Orders:  ?Orders Placed This Encounter  ?Procedures  ? MR Cervical Spine w/o contrast  ? ?No orders of the defined types were placed in this encounter. ? ? ? ? Procedures: ?No procedures performed ? ? ?Clinical Data: ?No additional findings. ? ? ?Subjective: ?Chief Complaint  ?Patient presents with  ? Right Shoulder - Pain  ? Left Shoulder - Pain  ? Neck - Pain  ? ? ?HPI 60 year old female returns with known cervical spondylosis.  I have not seen her in over a year and she returns and states she is continuing to have significant problems with her neck pain into her shoulders and now is ready to consider proceeding with surgery.  She has had problems with her shoulders in the past had previous rotator cuff surgery on the right. ? ?Patient wants to proceed with an updated MRI scan and then proceed with surgery.  Past history MRI of her left shoulder show full-thickness tear of the rotator cuff with some retraction rotator cuff tendinopathy.  Opposite right shoulder is already had surgery.  Patient denies any myelopathic symptoms just sharp pain when she  turns her neck pain that radiates from her neck into her shoulders down her arms. ? ?Patient originally sent to me by Dr. Daryll Brod.  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  She had 2 on-the-job injuries one was 2013 , the orther was 2018.  Patient lives alone she states she does not have family or friends support. ?Previous MRI cervical spine 2020 severe foraminal stenosis C5-6 and C6-7. ? ?Review of Systems all other systems noncontributory to HPI. ? ? ?Objective: ?Vital Signs: BP (!) 171/109   Ht '5\' 6"'$  (1.676 m)   Wt 153 lb (69.4 kg)   BMI 24.69 kg/m?  ? ?Physical Exam ?Constitutional:   ?   Appearance: She is well-developed.  ?HENT:  ?   Head: Normocephalic.  ?   Right Ear: External ear normal.  ?   Left Ear: External ear normal. There is no impacted cerumen.  ?Eyes:  ?   Pupils: Pupils are equal, round, and reactive to light.  ?Neck:  ?   Thyroid: No thyromegaly.  ?   Trachea: No tracheal deviation.  ?Cardiovascular:  ?   Rate and Rhythm: Normal rate.  ?Pulmonary:  ?   Effort: Pulmonary effort is normal.  ?Abdominal:  ?   Palpations: Abdomen is soft.  ?Musculoskeletal:  ?  Cervical back: No rigidity.  ?Skin: ?   General: Skin is warm and dry.  ?Neurological:  ?   Mental Status: She is alert and oriented to person, place, and time.  ?Psychiatric:     ?   Behavior: Behavior normal.  ? ? ?Ortho Exam patient has positive Spurling on the left some increased pain with cervical compression no change with distraction.  No brachial plexus tenderness on the right.  Upper extremity reflexes are 2+.  No lower extremity clonus.  Normal heel-toe gait.  Decreased sensation radial side of her hand index and thumb. ? ?Specialty Comments:  ?No specialty comments available. ? ?Imaging: ?No results found. ? ? ?PMFS History: ?Patient Active Problem List  ? Diagnosis Date Noted  ? Other spondylosis with radiculopathy, cervical region  04/21/2021  ? Foraminal stenosis of cervical region 04/21/2021  ? Myalgia due to statin 07/21/2020  ? Leg swelling 06/10/2020  ? Elevated coronary artery calcium score 06/10/2020  ? Candida laryngitis 10/22/2019  ? Hoarseness 10/22/2019  ? Laryngospasms 10/22/2019  ? Muscle tension dysphonia 10/22/2019  ? Globus sensation 10/22/2019  ? Plantar fasciitis, right 07/05/2019  ? Lumbar spinal stenosis 07/05/2019  ? GAD (generalized anxiety disorder) 06/20/2019  ? Educated about COVID-19 virus infection 06/09/2019  ? Dyslalia 06/09/2019  ? Ptosis of left eyelid 06/04/2019  ? Left-sided Bell's palsy 04/04/2019  ? Closed nondisplaced fracture of distal phalanx of right ring finger 04/03/2019  ? Abnormal liver function tests 12/14/2018  ? 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 Cervical spinal stenosis 12/14/2018  ? Nontraumatic incomplete tear of left rotator cuff 06/01/2018  ? Biceps tendinitis of left shoulder 03/01/2018  ? 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 PTSD (post-traumatic stress disorder) 04/07/2017  ? Irritable bowel syndrome with diarrhea 04/07/2017  ? Benign colon polyp 04/07/2017  ? History of Clostridium difficile colitis 04/07/2017  ? History of peptic ulcer 04/07/2017  ? Chronic neck pain - post injury, worker's comp 2013, 2018 04/07/2017  ? PVC's (premature ventricular contractions) 10/11/2016  ? Dyslipidemia 10/11/2016  ? ?Past Medical History:  ?Diagnosis Date  ? Allergy   ? Anemia   ? Benign colon polyp 04/07/2017  ? Q 5 year recall  ? Chronic neck pain - post injury, worker's comp 2013, 2018 04/07/2017  ? Dr. Mardelle Matte  ? Clostridium difficile infection   ? History of peptic ulcer 04/07/2017  ? Hyperlipidemia   ? IBS (irritable bowel syndrome) 04/07/2017  ? Colonoscopy by Carlean Purl  ? Migraines   ? PTSD (post-traumatic stress disorder) 04/07/2017  ? Ulcer   ? age 56  ?  ?Family History  ?Problem Relation Age of Onset  ? Hypertension Mother   ? Alcohol abuse Mother   ? Arthritis Mother   ? Cancer  Mother   ? Heart disease Mother   ? Kidney disease Mother   ? Mental illness Mother   ? Hypertension Father   ? CAD Father 18  ?     CABG   ? Hyperlipidemia Father   ? Heart disease Father   ? Early death Father   ? Heart attack Father   ? Hypertension Brother   ? Alcohol abuse Brother   ? Heart disease Brother   ? Breast cancer Maternal Aunt   ? Cancer Maternal Aunt   ?     breast cancer  ? Hypertension Sister   ? Hyperlipidemia Sister   ? Arthritis Sister   ?  ?Past Surgical History:  ?Procedure Laterality Date  ?  CHOLECYSTECTOMY  2012  ? COLONOSCOPY  2014  ? DG GALL BLADDER    ? 2012  ? ESOPHAGOGASTRODUODENOSCOPY  2014  ? FLEXIBLE SIGMOIDOSCOPY  03/2014  ? OVARIAN CYST REMOVAL    ? right shoulder surgery    ? TMJ ARTHROSCOPY    ? ?Social History  ? ?Occupational History  ? Occupation: former Control and instrumentation engineer  ? Occupation: disabled  ?Tobacco Use  ? Smoking status: Never  ? Smokeless tobacco: Never  ?Vaping Use  ? Vaping Use: Never used  ?Substance and Sexual Activity  ? Alcohol use: Not Currently  ? Drug use: No  ? Sexual activity: Yes  ?  Birth control/protection: Condom  ? ? ? ? ? ? ?

## 2021-04-21 DIAGNOSIS — M4722 Other spondylosis with radiculopathy, cervical region: Secondary | ICD-10-CM | POA: Insufficient documentation

## 2021-04-21 DIAGNOSIS — M4802 Spinal stenosis, cervical region: Secondary | ICD-10-CM | POA: Insufficient documentation

## 2021-04-30 DIAGNOSIS — F4323 Adjustment disorder with mixed anxiety and depressed mood: Secondary | ICD-10-CM | POA: Diagnosis not present

## 2021-04-30 DIAGNOSIS — R69 Illness, unspecified: Secondary | ICD-10-CM | POA: Diagnosis not present

## 2021-05-06 ENCOUNTER — Telehealth: Payer: Self-pay | Admitting: Orthopaedic Surgery

## 2021-05-06 DIAGNOSIS — Z6827 Body mass index (BMI) 27.0-27.9, adult: Secondary | ICD-10-CM | POA: Diagnosis not present

## 2021-05-06 DIAGNOSIS — M542 Cervicalgia: Secondary | ICD-10-CM | POA: Diagnosis not present

## 2021-05-06 DIAGNOSIS — M4316 Spondylolisthesis, lumbar region: Secondary | ICD-10-CM | POA: Diagnosis not present

## 2021-05-06 NOTE — Telephone Encounter (Signed)
Signed auth received. Records faxed ?

## 2021-05-06 NOTE — Telephone Encounter (Signed)
Is she asking for a referral? Or just for her records to be sent?  I will have to ask him if referral is needed. ?

## 2021-05-06 NOTE — Telephone Encounter (Signed)
That is correct --does not want to see [facility] and Dr Lorin Mercy ---send records to [facility]  ?

## 2021-05-06 NOTE — Telephone Encounter (Signed)
Pt states to cxl everything with MRI and Dr  ? ?Please send all to [facility] --Release signed  ?

## 2021-05-06 NOTE — Telephone Encounter (Signed)
Can you take care of this for me? Please let me know if you need anything further from me! ?Thanks. ?

## 2021-05-06 NOTE — Telephone Encounter (Signed)
After reading into this a little, looks like pt is just wanting to have all her records sent to [facility], I cancelled the MRI. ?

## 2021-05-07 ENCOUNTER — Other Ambulatory Visit: Payer: Self-pay | Admitting: Orthopaedic Surgery

## 2021-05-07 ENCOUNTER — Other Ambulatory Visit: Payer: Self-pay | Admitting: Neurological Surgery

## 2021-05-07 DIAGNOSIS — M4316 Spondylolisthesis, lumbar region: Secondary | ICD-10-CM

## 2021-05-07 DIAGNOSIS — M542 Cervicalgia: Secondary | ICD-10-CM

## 2021-05-22 ENCOUNTER — Ambulatory Visit
Admission: RE | Admit: 2021-05-22 | Discharge: 2021-05-22 | Disposition: A | Discharge status: Home / Self Care | Payer: Medicare HMO | Source: Ambulatory Visit | Attending: Neurological Surgery | Admitting: Neurological Surgery

## 2021-05-22 DIAGNOSIS — M48061 Spinal stenosis, lumbar region without neurogenic claudication: Secondary | ICD-10-CM | POA: Diagnosis not present

## 2021-05-22 DIAGNOSIS — M542 Cervicalgia: Secondary | ICD-10-CM | POA: Diagnosis not present

## 2021-05-22 DIAGNOSIS — M4312 Spondylolisthesis, cervical region: Secondary | ICD-10-CM | POA: Diagnosis not present

## 2021-05-22 DIAGNOSIS — M4316 Spondylolisthesis, lumbar region: Secondary | ICD-10-CM | POA: Diagnosis not present

## 2021-05-22 DIAGNOSIS — M4802 Spinal stenosis, cervical region: Secondary | ICD-10-CM | POA: Diagnosis not present

## 2021-05-22 DIAGNOSIS — M545 Low back pain, unspecified: Secondary | ICD-10-CM | POA: Diagnosis not present

## 2021-06-08 DIAGNOSIS — M4316 Spondylolisthesis, lumbar region: Secondary | ICD-10-CM | POA: Diagnosis not present

## 2021-06-08 DIAGNOSIS — Z6826 Body mass index (BMI) 26.0-26.9, adult: Secondary | ICD-10-CM | POA: Diagnosis not present

## 2021-06-08 DIAGNOSIS — R69 Illness, unspecified: Secondary | ICD-10-CM | POA: Diagnosis not present

## 2021-06-08 DIAGNOSIS — M542 Cervicalgia: Secondary | ICD-10-CM | POA: Diagnosis not present

## 2021-06-08 DIAGNOSIS — F4323 Adjustment disorder with mixed anxiety and depressed mood: Secondary | ICD-10-CM | POA: Diagnosis not present

## 2021-06-10 ENCOUNTER — Other Ambulatory Visit: Payer: Self-pay | Admitting: Allergy and Immunology

## 2021-06-14 ENCOUNTER — Encounter: Payer: Self-pay | Admitting: Physical Medicine & Rehabilitation

## 2021-06-14 DIAGNOSIS — R69 Illness, unspecified: Secondary | ICD-10-CM | POA: Diagnosis not present

## 2021-06-14 DIAGNOSIS — F4323 Adjustment disorder with mixed anxiety and depressed mood: Secondary | ICD-10-CM | POA: Diagnosis not present

## 2021-06-25 DIAGNOSIS — M4316 Spondylolisthesis, lumbar region: Secondary | ICD-10-CM | POA: Diagnosis not present

## 2021-06-28 ENCOUNTER — Other Ambulatory Visit: Payer: Self-pay | Admitting: Allergy and Immunology

## 2021-06-28 ENCOUNTER — Other Ambulatory Visit: Payer: Self-pay | Admitting: Cardiology

## 2021-06-28 DIAGNOSIS — E785 Hyperlipidemia, unspecified: Secondary | ICD-10-CM

## 2021-06-28 DIAGNOSIS — R931 Abnormal findings on diagnostic imaging of heart and coronary circulation: Secondary | ICD-10-CM

## 2021-07-09 DIAGNOSIS — M25512 Pain in left shoulder: Secondary | ICD-10-CM | POA: Diagnosis not present

## 2021-07-09 DIAGNOSIS — M25511 Pain in right shoulder: Secondary | ICD-10-CM | POA: Diagnosis not present

## 2021-07-15 ENCOUNTER — Telehealth: Payer: Self-pay | Admitting: Cardiology

## 2021-07-15 NOTE — Telephone Encounter (Signed)
  Pt c/o medication issue:  1. Name of Medication:   Alirocumab (PRALUENT) 150 MG/ML SOAJ    2. How are you currently taking this medication (dosage and times per day)? INJECT 150 MG INTO THE SKIN EVERY 14 DAYS.  3. Are you having a reaction (difficulty breathing--STAT)?  4. What is your medication issue? Harold with [employer] called, he said, they need the section 2 part of the pt assistance form for the pt it needs to have a cover page showing that its coming from the [place] office with primary diagnosis code. He gave fax# [phone] and health care provider portal [email or web address]

## 2021-07-16 NOTE — Telephone Encounter (Signed)
Section 2 of patient assistance form signed and faxed to [employer]

## 2021-07-20 NOTE — Telephone Encounter (Signed)
Harold with [employer] support line following up. He states they received Section 2 of patient assistance form from 7/14, but it is outdated. He states this morning he sent a blank form to the office and he is hopeful to have this completed and sent back.  Phone#: [phone] Fax#: [phone] Provider Portal: [email or web address]

## 2021-07-21 DIAGNOSIS — T466X5A Adverse effect of antihyperlipidemic and antiarteriosclerotic drugs, initial encounter: Secondary | ICD-10-CM | POA: Diagnosis not present

## 2021-07-21 DIAGNOSIS — M791 Myalgia, unspecified site: Secondary | ICD-10-CM | POA: Diagnosis not present

## 2021-07-21 DIAGNOSIS — E785 Hyperlipidemia, unspecified: Secondary | ICD-10-CM | POA: Diagnosis not present

## 2021-07-21 DIAGNOSIS — R931 Abnormal findings on diagnostic imaging of heart and coronary circulation: Secondary | ICD-10-CM | POA: Diagnosis not present

## 2021-07-21 LAB — LIPID PANEL
Chol/HDL Ratio: 3.1 ratio (ref 0.0–4.4)
Cholesterol, Total: 217 mg/dL — ABNORMAL HIGH (ref 100–199)
HDL: 69 mg/dL (ref >39)
LDL Chol Calc (NIH): 134 mg/dL — ABNORMAL HIGH (ref 0–99)
Triglycerides: 79 mg/dL (ref 0–149)
VLDL Cholesterol Cal: 14 mg/dL (ref 5–40)

## 2021-07-21 NOTE — Telephone Encounter (Signed)
Pharm Team,  Grace Taylor came in for lab work today and dropped off a paper requesting we mail or fax a copy of the below details to "[employer] [facility]" :              - Copy of insurance card              - Copy of Rx              - Diagnosis associated with Rx  The mailing address that was provided:              [id number],               [place] [address]  Fax Option:               [phone]  Grace Taylor provided this phone number if we needed to contact the program directly with questions: [phone].

## 2021-07-21 NOTE — Telephone Encounter (Signed)
Updated form completed and faxed to [employer]

## 2021-07-23 DIAGNOSIS — M25511 Pain in right shoulder: Secondary | ICD-10-CM | POA: Diagnosis not present

## 2021-07-23 DIAGNOSIS — M25512 Pain in left shoulder: Secondary | ICD-10-CM | POA: Diagnosis not present

## 2021-07-23 NOTE — Telephone Encounter (Signed)
Harold from [employer] called. He did not get the paperwork that was faxed to the office. He asked that we re-send it to [phone] or the application can be filled out electronically at  [email or web address]

## 2021-07-23 NOTE — Telephone Encounter (Signed)
Application faxed again to [employer]

## 2021-07-26 DIAGNOSIS — M25512 Pain in left shoulder: Secondary | ICD-10-CM | POA: Diagnosis not present

## 2021-07-26 DIAGNOSIS — M25511 Pain in right shoulder: Secondary | ICD-10-CM | POA: Diagnosis not present

## 2021-08-05 ENCOUNTER — Encounter: Payer: Self-pay | Admitting: *Deleted

## 2021-08-05 ENCOUNTER — Ambulatory Visit: Payer: Medicare HMO | Admitting: Physical Medicine & Rehabilitation

## 2021-08-06 ENCOUNTER — Ambulatory Visit: Payer: Medicare HMO | Admitting: Physical Medicine & Rehabilitation

## 2021-08-11 DIAGNOSIS — M25511 Pain in right shoulder: Secondary | ICD-10-CM | POA: Diagnosis not present

## 2021-08-11 DIAGNOSIS — M25512 Pain in left shoulder: Secondary | ICD-10-CM | POA: Diagnosis not present

## 2021-08-20 DIAGNOSIS — M25512 Pain in left shoulder: Secondary | ICD-10-CM | POA: Diagnosis not present

## 2021-08-20 DIAGNOSIS — M25511 Pain in right shoulder: Secondary | ICD-10-CM | POA: Diagnosis not present

## 2021-08-26 NOTE — Progress Notes (Signed)
Cardiology Office Note   Date:  08/27/2021   ID:  RINDA ROLLYSON, DOB 1960/11/19, MRN [id number]  PCP:  [facility] [place]  Cardiologist:   Minus Breeding, MD    Chief Complaint  Patient presents with   Elevated coronary calcium      History of Present Illness: Dashanti E Ancrum is a 61 y.o. female who presented in 2018 for evaluation of strong family history, dizziness, episodes of shortness of breath and some chest discomfort.   She had a negative POET (Plain Old Exercise Treadmill ) in 2018.  She had a calcium score of 35 which was 78th percentile. She had a negative perfusion study in 2022.    Since I last saw her she has had no new cardiovascular symptoms.  She walks routinely.  She eats well.  She denies any chest pressure, neck or arm discomfort.  She had no new shortness of breath, PND or orthopnea.  Has had no palpitations, presyncope or syncope.  We got her on Praluent and she is doing well with this although she is having some trouble affording it and is not taking it exactly every 2 weeks.   Past Medical History:  Diagnosis Date   Allergy    Anemia    Benign colon polyp 04/07/2017   Q 5 year recall   Chronic neck pain - post injury, worker's comp 2013, 2018 04/07/2017   Dr. Mardelle Matte   Clostridium difficile infection    History of peptic ulcer 04/07/2017   Hyperlipidemia    IBS (irritable bowel syndrome) 04/07/2017   Colonoscopy by Carlean Purl   Migraines    PTSD (post-traumatic stress disorder) 04/07/2017   Ulcer    age 34    Past Surgical History:  Procedure Laterality Date   CHOLECYSTECTOMY  2012   COLONOSCOPY  2014   DG GALL BLADDER     2012   ESOPHAGOGASTRODUODENOSCOPY  2014   FLEXIBLE SIGMOIDOSCOPY  03/2014   OVARIAN CYST REMOVAL     right shoulder surgery     TMJ ARTHROSCOPY       Current Outpatient Medications  Medication Sig Dispense Refill   Alirocumab (PRALUENT) 150 MG/ML SOAJ INJECT 150 MG INTO THE SKIN EVERY 14 DAYS. 6 mL 0    clonazePAM (KLONOPIN) 0.5 MG tablet Take 1 tablet by mouth 2 (two) times daily.  1   ezetimibe (ZETIA) 10 MG tablet Take 1 tablet (10 mg total) by mouth daily. 90 tablet 3   famotidine (PEPCID) 40 MG tablet TAKE 1 TABLET BY MOUTH EVERY DAY 90 tablet 1   fluticasone (FLONASE) 50 MCG/ACT nasal spray Place 2 sprays into both nostrils daily.      Multiple Vitamin (MULITIVITAMIN WITH MINERALS) TABS Take 1 tablet by mouth daily.     Omega-3 1000 MG CAPS Omega 3     omeprazole (PRILOSEC) 40 MG capsule Take 1 capsule (40 mg total) by mouth in the morning and at bedtime. 180 capsule 3   Ophthalmic Irrigation Solution (EYE [place] OP) Apply to eye as needed.     phentermine (ADIPEX-P) 37.5 MG tablet Take 18.75-37.5 mg by mouth as needed.     zolpidem (AMBIEN) 10 MG tablet Take 10 mg by mouth at bedtime.      No current facility-administered medications for this visit.    Allergies:   Codeine, Crestor [rosuvastatin], Lipitor [atorvastatin], Penicillins, Simvastatin, and Sulfonamide derivatives    ROS:  Please see the history of present illness.   Otherwise, review of  systems are positive for none.   All other systems are reviewed and negative.    PHYSICAL EXAM: VS:  BP 118/70   Pulse 78   Ht '5\' 6"'$  (1.676 m)   Wt 160 lb 6.4 oz (72.8 kg)   SpO2 98%   BMI 25.89 kg/m  , BMI Body mass index is 25.89 kg/m. GENERAL:  Well appearing NECK:  No jugular venous distention, waveform within normal limits, carotid upstroke brisk and symmetric, no bruits, no thyromegaly LUNGS:  Clear to auscultation bilaterally CHEST:  Unremarkable HEART:  PMI not displaced or sustained,S1 and S2 within normal limits, no S3, no S4, no clicks, no rubs, no murmurs ABD:  Flat, positive bowel sounds normal in frequency in pitch, no bruits, no rebound, no guarding, no midline pulsatile mass, no hepatomegaly, no splenomegaly EXT:  2 plus pulses throughout, no edema, no cyanosis no clubbing   EKG:  EKG is  ordered today. Sinus  rhythm, rate 78, axis within normal limits, intervals within normal limits, no acute ST-T wave changes.   Recent Labs: No results found for requested labs within last 365 days.    Lipid Panel    Wt Readings from Last 3 Encounters:  08/27/21 160 lb 6.4 oz (72.8 kg)  04/20/21 153 lb (69.4 kg)  01/18/21 153 lb (69.4 kg)      Other studies Reviewed: Additional studies/ records that were reviewed today include: Labs Review of the above records demonstrates:  Please see elsewhere in the note.     ASSESSMENT AND PLAN:  ELEVATED CORONARY CALCIUM:   She is practicing primary risk reduction.  She had no new symptoms since her stress test.  No change in therapy is indicated.   DYSLIPIDEMIA:    Previous LDL is 198 and came down to 134.  I would like it to be less than 100 and I am going to add Zetia 10 mg daily.  Of note she is having some trouble getting the donut hole for her PCSK9 and I sent a message to our pharmacist.   The following changes have been made: As above  Labs/ tests ordered today include:  Orders Placed This Encounter  Procedures   Lipid panel     Disposition:   FU with me in 1 year  Signed, Minus Breeding, MD  08/27/2021 3:50 PM    [facility]

## 2021-08-27 ENCOUNTER — Ambulatory Visit: Payer: Medicare HMO | Admitting: Cardiology

## 2021-08-27 ENCOUNTER — Encounter: Payer: Self-pay | Admitting: Cardiology

## 2021-08-27 VITALS — BP 118/70 | HR 78 | Ht 66.0 in | Wt 160.4 lb

## 2021-08-27 DIAGNOSIS — E785 Hyperlipidemia, unspecified: Secondary | ICD-10-CM | POA: Diagnosis not present

## 2021-08-27 DIAGNOSIS — R931 Abnormal findings on diagnostic imaging of heart and coronary circulation: Secondary | ICD-10-CM

## 2021-08-27 DIAGNOSIS — M7989 Other specified soft tissue disorders: Secondary | ICD-10-CM | POA: Diagnosis not present

## 2021-08-27 MED ORDER — EZETIMIBE 10 MG PO TABS: 10.0000 mg | ORAL_TABLET | Freq: Every day | ORAL | 3 refills | Status: DC

## 2021-08-27 NOTE — Patient Instructions (Signed)
Medication Instructions:   -Start ezetimibe (zetia) '10mg'$  once daily.  *If you need a refill on your cardiac medications before your next appointment, please call your pharmacy*   Lab Work: Your physician recommends that you return for lab work in: 3 months for FASTING lipid(cholesertol) panel  If you have labs (blood work) drawn today and your tests are completely normal, you will receive your results only by: [place] (if you have MyChart) OR A paper copy in the mail If you have any lab test that is abnormal or we need to change your treatment, we will call you to review the results.   Follow-Up: At [facility], you and your health needs are our priority.  As part of our continuing mission to provide you with exceptional heart care, we have created designated Provider Care Teams.  These Care Teams include your primary Cardiologist (physician) and Advanced Practice Providers (APPs -  Physician Assistants and Nurse Practitioners) who all work together to provide you with the care you need, when you need it.  We recommend signing up for the patient portal called "MyChart".  Sign up information is provided on this After Visit Summary.  MyChart is used to connect with patients for Virtual Visits (Telemedicine).  Patients are able to view lab/test results, encounter notes, upcoming appointments, etc.  Non-urgent messages can be sent to your provider as well.   To learn more about what you can do with MyChart, go to [email or web address].    Your next appointment:   12 month(s)  The format for your next appointment:   In Person  Provider:   Minus Breeding, MD

## 2021-08-31 NOTE — Addendum Note (Signed)
Addended by: Deanna Artis A on: 08/31/2021 08:10 AM   Modules accepted: Orders

## 2021-09-07 DIAGNOSIS — F4323 Adjustment disorder with mixed anxiety and depressed mood: Secondary | ICD-10-CM | POA: Diagnosis not present

## 2021-09-07 DIAGNOSIS — R69 Illness, unspecified: Secondary | ICD-10-CM | POA: Diagnosis not present

## 2021-09-18 DIAGNOSIS — M25511 Pain in right shoulder: Secondary | ICD-10-CM | POA: Diagnosis not present

## 2021-09-18 DIAGNOSIS — M25512 Pain in left shoulder: Secondary | ICD-10-CM | POA: Diagnosis not present

## 2021-09-28 ENCOUNTER — Other Ambulatory Visit: Payer: Self-pay | Admitting: Allergy and Immunology

## 2021-10-04 DIAGNOSIS — M25512 Pain in left shoulder: Secondary | ICD-10-CM | POA: Diagnosis not present

## 2021-10-04 DIAGNOSIS — M25511 Pain in right shoulder: Secondary | ICD-10-CM | POA: Diagnosis not present

## 2021-10-11 DIAGNOSIS — F4323 Adjustment disorder with mixed anxiety and depressed mood: Secondary | ICD-10-CM | POA: Diagnosis not present

## 2021-10-11 DIAGNOSIS — R69 Illness, unspecified: Secondary | ICD-10-CM | POA: Diagnosis not present

## 2021-10-14 DIAGNOSIS — Z0100 Encounter for examination of eyes and vision without abnormal findings: Secondary | ICD-10-CM | POA: Diagnosis not present

## 2021-10-27 ENCOUNTER — Ambulatory Visit (HOSPITAL_BASED_OUTPATIENT_CLINIC_OR_DEPARTMENT_OTHER): Payer: Medicare HMO | Admitting: Orthopaedic Surgery

## 2021-10-27 ENCOUNTER — Other Ambulatory Visit (HOSPITAL_BASED_OUTPATIENT_CLINIC_OR_DEPARTMENT_OTHER): Payer: Self-pay

## 2021-10-27 DIAGNOSIS — S46012A Strain of muscle(s) and tendon(s) of the rotator cuff of left shoulder, initial encounter: Secondary | ICD-10-CM

## 2021-10-27 MED ORDER — ACETAMINOPHEN 500 MG PO TABS: 500.0000 mg | ORAL_TABLET | Freq: Three times a day (TID) | ORAL | 0 refills | Status: AC

## 2021-10-27 MED ORDER — OXYCODONE HCL 5 MG PO CAPS: 5.0000 mg | ORAL_CAPSULE | ORAL | 0 refills | Status: DC | PRN

## 2021-10-27 MED ORDER — IBUPROFEN 800 MG PO TABS: 800.0000 mg | ORAL_TABLET | Freq: Three times a day (TID) | ORAL | 0 refills | Status: DC

## 2021-10-27 MED ORDER — ASPIRIN 325 MG PO TBEC: 325.0000 mg | DELAYED_RELEASE_TABLET | Freq: Every day | ORAL | 0 refills | Status: DC

## 2021-10-27 NOTE — Addendum Note (Signed)
Addended by: Yevonne Pax on: 10/27/2021 03:42 PM   Modules accepted: Orders

## 2021-10-27 NOTE — H&P (View-Only) (Signed)
Chief Complaint: Left shoulder pain     History of Present Illness:    Grace Taylor is a 61 y.o. female right-hand-dominant female presents with left worse than right shoulder pain after 2018 when she had a fall.  Since that time she has had pain in the shoulder particularly with overhead motion where she feels like the bone is rubbing on the bone.  She has been working on a Armed forces logistics/support/administrative officer as she is familiar with rotator cuff rehab protocol as she previously had the right shoulder repaired in the early 90s by Dr. Latanya Maudlin.  She states this time she is having pain and limited activity overhead.  She is having a hard time sleeping on the side.  He is currently disabled from multiple other musculoskeletal injuries and a previous fall off a roof while working at [employer].  She has not had any injections although this has previously been offered and she has deferred this.  She has previously trialed anti-inflammatories.  She has recently seen an outside orthopedist and reverse shoulder replacement was recommended.    Surgical History:   none  PMH/PSH/Family History/Social History/Meds/Allergies:    Past Medical History:  Diagnosis Date   Allergy    Anemia    Benign colon polyp 04/07/2017   Q 5 year recall   Chronic neck pain - post injury, worker's comp 2013, 2018 04/07/2017   Dr. Mardelle Matte   Clostridium difficile infection    History of peptic ulcer 04/07/2017   Hyperlipidemia    IBS (irritable bowel syndrome) 04/07/2017   Colonoscopy by Carlean Purl   Migraines    PTSD (post-traumatic stress disorder) 04/07/2017   Ulcer    age 66   Past Surgical History:  Procedure Laterality Date   CHOLECYSTECTOMY  2012   COLONOSCOPY  2014   DG GALL BLADDER     2012   ESOPHAGOGASTRODUODENOSCOPY  2014   FLEXIBLE SIGMOIDOSCOPY  03/2014   OVARIAN CYST REMOVAL     right shoulder surgery     TMJ ARTHROSCOPY     Social History   Socioeconomic History    Marital status: Divorced    Spouse name: Not on file   Number of children: 0   Years of education: some college   Highest education level: Not on file  Occupational History   Occupation: former Control and instrumentation engineer   Occupation: disabled  Tobacco Use   Smoking status: Never   Smokeless tobacco: Never  Vaping Use   Vaping Use: Never used  Substance and Sexual Activity   Alcohol use: Not Currently   Drug use: No   Sexual activity: Yes    Birth control/protection: Condom  Other Topics Concern   Not on file  Social History Narrative   Worked at the [employer], on SSD or SSI - but no health insurance   Social Determinants of Health   Financial Resource Strain: Medium Risk (04/07/2017)   Overall Financial Resource Strain (CARDIA)    Difficulty of Paying Living Expenses: Somewhat hard  Food Insecurity: Food Insecurity Present (04/07/2017)   Hunger Vital Sign    Worried About Running Out of Food in the Last Year: Sometimes true    Ran Out of Food in the Last Year: Not on file  Transportation Needs: Not on file  Physical Activity: Not on file  Stress: Stress Concern Present (04/07/2017)   [place]    Feeling of Stress : Very much  Social Connections: Not on file   Family History  Problem Relation Age of Onset   Hypertension Mother    Alcohol abuse Mother    Arthritis Mother    Cancer Mother    Heart disease Mother    Kidney disease Mother    Mental illness Mother    Hypertension Father    CAD Father 69       CABG    Hyperlipidemia Father    Heart disease Father    Early death Father    Heart attack Father    Hypertension Brother    Alcohol abuse Brother    Heart disease Brother    Breast cancer Maternal Aunt    Cancer Maternal Aunt        breast cancer   Hypertension Sister    Hyperlipidemia Sister    Arthritis Sister    Allergies  Allergen Reactions   Codeine    Crestor [Rosuvastatin]     INEFFECTIVE AT  LOWERING LDL   Lipitor [Atorvastatin]     INEFFECTIVE AT LOWERING LDL   Penicillins Hives   Simvastatin     ELEVATED LIVER ENZYMES   Sulfonamide Derivatives Nausea And Vomiting and Rash   Current Outpatient Medications  Medication Sig Dispense Refill   Alirocumab (PRALUENT) 150 MG/ML SOAJ INJECT 150 MG INTO THE SKIN EVERY 14 DAYS. 6 mL 0   clonazePAM (KLONOPIN) 0.5 MG tablet Take 1 tablet by mouth 2 (two) times daily.  1   ezetimibe (ZETIA) 10 MG tablet Take 1 tablet (10 mg total) by mouth daily. 90 tablet 3   famotidine (PEPCID) 40 MG tablet TAKE 1 TABLET BY MOUTH EVERY DAY 90 tablet 1   fluticasone (FLONASE) 50 MCG/ACT nasal spray Place 2 sprays into both nostrils daily.      Multiple Vitamin (MULITIVITAMIN WITH MINERALS) TABS Take 1 tablet by mouth daily.     Omega-3 1000 MG CAPS Omega 3     omeprazole (PRILOSEC) 40 MG capsule Take 1 capsule (40 mg total) by mouth in the morning and at bedtime. 180 capsule 3   Ophthalmic Irrigation Solution (EYE [place] OP) Apply to eye as needed.     phentermine (ADIPEX-P) 37.5 MG tablet Take 18.75-37.5 mg by mouth as needed.     zolpidem (AMBIEN) 10 MG tablet Take 10 mg by mouth at bedtime.      No current facility-administered medications for this visit.   No results found.  Review of Systems:   A ROS was performed including pertinent positives and negatives as documented in the HPI.  Physical Exam :   Constitutional: NAD and appears stated age Neurological: Alert and oriented Psych: Appropriate affect and cooperative There were no vitals taken for this visit.   Comprehensive Musculoskeletal Exam:    Musculoskeletal Exam    Inspection Right Left  Skin No atrophy or winging No atrophy or winging  Palpation    Tenderness Biceps Lateral deltoid, biceps  Range of Motion    Flexion (passive) 170 170  Flexion (active) 170 170  Abduction 170 170  ER at the side 70 70  Can reach behind back to T12 T12  Strength     4+ out of 5 4/5   Special Tests    Pseudoparalytic No No  Neurologic    Fires PIN, radial, median, ulnar, musculocutaneous, axillary, suprascapular, long  thoracic, and spinal accessory innervated muscles. No abnormal sensibility  Vascular/Lymphatic    Radial Pulse 2+ 2+  Cervical Exam    Patient has symmetric cervical range of motion with negative Spurling's test.  Special Test: Negative belly press     Imaging:    MRI (left shoulder): Nearly full-thickness tear involving the rotator cuff.  There is a borderline tangent sign although overall intact muscle belly on the T1 sagittal view.  Mild glenohumeral degeneration.   I personally reviewed and interpreted the radiographs.   Assessment:   61 y.o. female left shoulder pain consistent with a full-thickness rotator cuff tear.  I discussed treatment options with her.  She has been able to trial a home program and strengthening with limited relief.  I did discuss that an injection may help with this although she has already been able to work through a reasonable strengthening program.  She has had limited relief with this.  This time I did discuss her surgical treatment options as well.  Specifically she does have mild degenerative findings about the glenohumeral joint with a full-thickness rotator cuff tear.  I do believe that she could benefit from rotator cuff repair with collagen patch augmentation.  Specifically she does have quite good overhead range of motion and her symptoms appear to be emanating predominantly from the biceps from her tear.  That being said I do believe that given the chronicity of the tear that she would benefit from patch augmentation as well as a biceps tenodesis.  After discussion of this she would like to proceed.  Plan :    -Plan for left shoulder arthroscopy with rotator cuff repair and biceps tenodesis with collagen patch augmentation   After a lengthy discussion of treatment options, including risks, benefits, alternatives,  complications of surgical and nonsurgical conservative options, the patient elected surgical repair.   The patient  is aware of the material risks  and complications including, but not limited to injury to adjacent structures, neurovascular injury, infection, numbness, bleeding, implant failure, thermal burns, stiffness, persistent pain, failure to heal, disease transmission from allograft, need for further surgery, dislocation, anesthetic risks, blood clots, risks of death,and others. The probabilities of surgical success and failure discussed with patient given their particular co-morbidities.The time and nature of expected rehabilitation and recovery was discussed.The patient's questions were all answered preoperatively.  No barriers to understanding were noted. I explained the natural history of the disease process and Rx rationale.  I explained to the patient what I considered to be reasonable expectations given their personal situation.  The final treatment plan was arrived at through a shared patient decision making process model.      I personally saw and evaluated the patient, and participated in the management and treatment plan.  Vanetta Mulders, MD Attending Physician, Orthopedic Surgery  This document was dictated using Dragon voice recognition software. A reasonable attempt at proof reading has been made to minimize errors.

## 2021-10-27 NOTE — Progress Notes (Signed)
Chief Complaint: Left shoulder pain     History of Present Illness:    Grace Taylor is a 61 y.o. female right-hand-dominant female presents with left worse than right shoulder pain after 2018 when she had a fall.  Since that time she has had pain in the shoulder particularly with overhead motion where she feels like the bone is rubbing on the bone.  She has been working on a Armed forces logistics/support/administrative officer as she is familiar with rotator cuff rehab protocol as she previously had the right shoulder repaired in the early 90s by Dr. Latanya Maudlin.  She states this time she is having pain and limited activity overhead.  She is having a hard time sleeping on the side.  He is currently disabled from multiple other musculoskeletal injuries and a previous fall off a roof while working at [employer].  She has not had any injections although this has previously been offered and she has deferred this.  She has previously trialed anti-inflammatories.  She has recently seen an outside orthopedist and reverse shoulder replacement was recommended.    Surgical History:   none  PMH/PSH/Family History/Social History/Meds/Allergies:    Past Medical History:  Diagnosis Date   Allergy    Anemia    Benign colon polyp 04/07/2017   Q 5 year recall   Chronic neck pain - post injury, worker's comp 2013, 2018 04/07/2017   Dr. Mardelle Matte   Clostridium difficile infection    History of peptic ulcer 04/07/2017   Hyperlipidemia    IBS (irritable bowel syndrome) 04/07/2017   Colonoscopy by Carlean Purl   Migraines    PTSD (post-traumatic stress disorder) 04/07/2017   Ulcer    age 17   Past Surgical History:  Procedure Laterality Date   CHOLECYSTECTOMY  2012   COLONOSCOPY  2014   DG GALL BLADDER     2012   ESOPHAGOGASTRODUODENOSCOPY  2014   FLEXIBLE SIGMOIDOSCOPY  03/2014   OVARIAN CYST REMOVAL     right shoulder surgery     TMJ ARTHROSCOPY     Social History   Socioeconomic History    Marital status: Divorced    Spouse name: Not on file   Number of children: 0   Years of education: some college   Highest education level: Not on file  Occupational History   Occupation: former Control and instrumentation engineer   Occupation: disabled  Tobacco Use   Smoking status: Never   Smokeless tobacco: Never  Vaping Use   Vaping Use: Never used  Substance and Sexual Activity   Alcohol use: Not Currently   Drug use: No   Sexual activity: Yes    Birth control/protection: Condom  Other Topics Concern   Not on file  Social History Narrative   Worked at the [employer], on SSD or SSI - but no health insurance   Social Determinants of Health   Financial Resource Strain: Medium Risk (04/07/2017)   Overall Financial Resource Strain (CARDIA)    Difficulty of Paying Living Expenses: Somewhat hard  Food Insecurity: Food Insecurity Present (04/07/2017)   Hunger Vital Sign    Worried About Running Out of Food in the Last Year: Sometimes true    Ran Out of Food in the Last Year: Not on file  Transportation Needs: Not on file  Physical Activity: Not on file  Stress: Stress Concern Present (04/07/2017)   [place]    Feeling of Stress : Very much  Social Connections: Not on file   Family History  Problem Relation Age of Onset   Hypertension Mother    Alcohol abuse Mother    Arthritis Mother    Cancer Mother    Heart disease Mother    Kidney disease Mother    Mental illness Mother    Hypertension Father    CAD Father 41       CABG    Hyperlipidemia Father    Heart disease Father    Early death Father    Heart attack Father    Hypertension Brother    Alcohol abuse Brother    Heart disease Brother    Breast cancer Maternal Aunt    Cancer Maternal Aunt        breast cancer   Hypertension Sister    Hyperlipidemia Sister    Arthritis Sister    Allergies  Allergen Reactions   Codeine    Crestor [Rosuvastatin]     INEFFECTIVE AT  LOWERING LDL   Lipitor [Atorvastatin]     INEFFECTIVE AT LOWERING LDL   Penicillins Hives   Simvastatin     ELEVATED LIVER ENZYMES   Sulfonamide Derivatives Nausea And Vomiting and Rash   Current Outpatient Medications  Medication Sig Dispense Refill   Alirocumab (PRALUENT) 150 MG/ML SOAJ INJECT 150 MG INTO THE SKIN EVERY 14 DAYS. 6 mL 0   clonazePAM (KLONOPIN) 0.5 MG tablet Take 1 tablet by mouth 2 (two) times daily.  1   ezetimibe (ZETIA) 10 MG tablet Take 1 tablet (10 mg total) by mouth daily. 90 tablet 3   famotidine (PEPCID) 40 MG tablet TAKE 1 TABLET BY MOUTH EVERY DAY 90 tablet 1   fluticasone (FLONASE) 50 MCG/ACT nasal spray Place 2 sprays into both nostrils daily.      Multiple Vitamin (MULITIVITAMIN WITH MINERALS) TABS Take 1 tablet by mouth daily.     Omega-3 1000 MG CAPS Omega 3     omeprazole (PRILOSEC) 40 MG capsule Take 1 capsule (40 mg total) by mouth in the morning and at bedtime. 180 capsule 3   Ophthalmic Irrigation Solution (EYE [facility] OP) Apply to eye as needed.     phentermine (ADIPEX-P) 37.5 MG tablet Take 18.75-37.5 mg by mouth as needed.     zolpidem (AMBIEN) 10 MG tablet Take 10 mg by mouth at bedtime.      No current facility-administered medications for this visit.   No results found.  Review of Systems:   A ROS was performed including pertinent positives and negatives as documented in the HPI.  Physical Exam :   Constitutional: NAD and appears stated age Neurological: Alert and oriented Psych: Appropriate affect and cooperative There were no vitals taken for this visit.   Comprehensive Musculoskeletal Exam:    Musculoskeletal Exam    Inspection Right Left  Skin No atrophy or winging No atrophy or winging  Palpation    Tenderness Biceps Lateral deltoid, biceps  Range of Motion    Flexion (passive) 170 170  Flexion (active) 170 170  Abduction 170 170  ER at the side 70 70  Can reach behind back to T12 T12  Strength     4+ out of 5 4/5   Special Tests    Pseudoparalytic No No  Neurologic    Fires PIN, radial, median, ulnar, musculocutaneous, axillary, suprascapular, long  thoracic, and spinal accessory innervated muscles. No abnormal sensibility  Vascular/Lymphatic    Radial Pulse 2+ 2+  Cervical Exam    Patient has symmetric cervical range of motion with negative Spurling's test.  Special Test: Negative belly press     Imaging:    MRI (left shoulder): Nearly full-thickness tear involving the rotator cuff.  There is a borderline tangent sign although overall intact muscle belly on the T1 sagittal view.  Mild glenohumeral degeneration.   I personally reviewed and interpreted the radiographs.   Assessment:   61 y.o. female left shoulder pain consistent with a full-thickness rotator cuff tear.  I discussed treatment options with her.  She has been able to trial a home program and strengthening with limited relief.  I did discuss that an injection may help with this although she has already been able to work through a reasonable strengthening program.  She has had limited relief with this.  This time I did discuss her surgical treatment options as well.  Specifically she does have mild degenerative findings about the glenohumeral joint with a full-thickness rotator cuff tear.  I do believe that she could benefit from rotator cuff repair with collagen patch augmentation.  Specifically she does have quite good overhead range of motion and her symptoms appear to be emanating predominantly from the biceps from her tear.  That being said I do believe that given the chronicity of the tear that she would benefit from patch augmentation as well as a biceps tenodesis.  After discussion of this she would like to proceed.  Plan :    -Plan for left shoulder arthroscopy with rotator cuff repair and biceps tenodesis with collagen patch augmentation   After a lengthy discussion of treatment options, including risks, benefits, alternatives,  complications of surgical and nonsurgical conservative options, the patient elected surgical repair.   The patient  is aware of the material risks  and complications including, but not limited to injury to adjacent structures, neurovascular injury, infection, numbness, bleeding, implant failure, thermal burns, stiffness, persistent pain, failure to heal, disease transmission from allograft, need for further surgery, dislocation, anesthetic risks, blood clots, risks of death,and others. The probabilities of surgical success and failure discussed with patient given their particular co-morbidities.The time and nature of expected rehabilitation and recovery was discussed.The patient's questions were all answered preoperatively.  No barriers to understanding were noted. I explained the natural history of the disease process and Rx rationale.  I explained to the patient what I considered to be reasonable expectations given their personal situation.  The final treatment plan was arrived at through a shared patient decision making process model.      I personally saw and evaluated the patient, and participated in the management and treatment plan.  Vanetta Mulders, MD Attending Physician, Orthopedic Surgery  This document was dictated using Dragon voice recognition software. A reasonable attempt at proof reading has been made to minimize errors.

## 2021-10-28 ENCOUNTER — Other Ambulatory Visit: Payer: Self-pay | Admitting: Allergy and Immunology

## 2021-11-01 ENCOUNTER — Encounter (HOSPITAL_BASED_OUTPATIENT_CLINIC_OR_DEPARTMENT_OTHER): Payer: Self-pay | Admitting: Orthopaedic Surgery

## 2021-11-02 ENCOUNTER — Encounter: Payer: Self-pay | Admitting: Allergy and Immunology

## 2021-11-02 ENCOUNTER — Ambulatory Visit: Payer: Medicare HMO | Admitting: Allergy and Immunology

## 2021-11-02 ENCOUNTER — Telehealth: Payer: Self-pay

## 2021-11-02 ENCOUNTER — Telehealth: Payer: Self-pay | Admitting: *Deleted

## 2021-11-02 VITALS — BP 106/62 | HR 98 | Temp 98.0°F | Resp 18

## 2021-11-02 DIAGNOSIS — K219 Gastro-esophageal reflux disease without esophagitis: Secondary | ICD-10-CM | POA: Diagnosis not present

## 2021-11-02 DIAGNOSIS — H04123 Dry eye syndrome of bilateral lacrimal glands: Secondary | ICD-10-CM

## 2021-11-02 DIAGNOSIS — J3489 Other specified disorders of nose and nasal sinuses: Secondary | ICD-10-CM

## 2021-11-02 DIAGNOSIS — J3089 Other allergic rhinitis: Secondary | ICD-10-CM | POA: Diagnosis not present

## 2021-11-02 MED ORDER — OMEPRAZOLE 40 MG PO CPDR: DELAYED_RELEASE_CAPSULE | ORAL | 5 refills | Status: DC

## 2021-11-02 MED ORDER — FAMOTIDINE 40 MG PO TABS: 40.0000 mg | ORAL_TABLET | Freq: Every evening | ORAL | 1 refills | Status: DC

## 2021-11-02 NOTE — Progress Notes (Unsigned)
[facility] - [place]   Follow-up Note  Referring Provider: [facility], [facility] Primary Provider: [place] [facility] Date of Office Visit: 11/02/2021  Subjective:   Grace Taylor (DOB: 1960-10-18) is a 61 y.o. female who returns to the [facility] on 11/02/2021 in re-evaluation of the following:  HPI: Grace Taylor returns to this clinic in evaluation of LPR, allergic rhinitis, septal perforation, and dry eye syndrome.  I last saw her in this clinic on 15 September 2020.  She has had very little problems with her throat or her reflux while consistently using therapy directed against reflux.  It sounds as though she uses omeprazole and famotidine on most days on a consistent basis.  Her nose is doing pretty well while using a nasal steroid.  She has not had any bleeding from her nose.  She remains away from all antihistamine use secondary to her dry eye syndrome which is under the care of [facility] ophthal[facility]  She does not receive vaccines.  Allergies as of 11/02/2021       Reactions   Codeine    Crestor [rosuvastatin]    INEFFECTIVE AT LOWERING LDL   Lipitor [atorvastatin]    INEFFECTIVE AT LOWERING LDL   Penicillins Hives   Simvastatin    ELEVATED LIVER ENZYMES   Sulfonamide Derivatives Nausea And Vomiting, Rash        Medication List    acetaminophen 500 MG tablet Commonly known as: TYLENOL Take 1 tablet (500 mg total) by mouth every 8 (eight) hours for 10 days.   aspirin EC 325 MG tablet Take 1 tablet (325 mg total) by mouth daily.   clonazePAM 0.5 MG tablet Commonly known as: KLONOPIN Take 1 tablet by mouth 2 (two) times daily.   EYE [facility] Apply to eye as needed.   ezetimibe 10 MG tablet Commonly known as: ZETIA Take 1 tablet (10 mg total) by mouth daily.   famotidine 40 MG tablet Commonly known as: PEPCID TAKE 1 TABLET BY MOUTH EVERY DAY   fluticasone 50 MCG/ACT nasal  spray Commonly known as: FLONASE Place 2 sprays into both nostrils daily as needed for allergies or rhinitis.   ibuprofen 800 MG tablet Commonly known as: ADVIL Take 1 tablet (800 mg total) by mouth every 8 (eight) hours for 10 days. Please take with food, please alternate with acetaminophen   multivitamin with minerals Tabs tablet Take 1 tablet by mouth daily.   Omega-3 1000 MG Caps Omega 3   omeprazole 40 MG capsule Commonly known as: PRILOSEC TAKE 1 CAPSULE BY MOUTH IN THE MORNING AND AT BEDTIME.   oxycodone 5 MG capsule Commonly known as: OXY-IR Take 1 capsule (5 mg total) by mouth every 4 (four) hours as needed (severe pain).   phentermine 37.5 MG tablet Commonly known as: ADIPEX-P Take 18.75-37.5 mg by mouth as needed.   Praluent 150 MG/ML Soaj Generic drug: Alirocumab INJECT 150 MG INTO THE SKIN EVERY 14 DAYS.   zolpidem 10 MG tablet Commonly known as: AMBIEN Take 10 mg by mouth at bedtime.        Past Medical History:  Diagnosis Date   Allergy    Anemia    Benign colon polyp 04/07/2017   Q 5 year recall   Chronic neck pain - post injury, worker's comp 2013, 2018 04/07/2017   Grace Taylor   Clostridium difficile infection    History of peptic ulcer 04/07/2017   Hyperlipidemia  IBS (irritable bowel syndrome) 04/07/2017   Colonoscopy by Grace [facility]   Migraines    PTSD (post-traumatic stress disorder) 04/07/2017   Ulcer    age 34    Past Surgical History:  Procedure Laterality Date   CHOLECYSTECTOMY  2012   COLONOSCOPY  2014   DG GALL BLADDER     2012   ESOPHAGOGASTRODUODENOSCOPY  2014   FLEXIBLE SIGMOIDOSCOPY  03/2014   OVARIAN CYST REMOVAL     right shoulder surgery     TMJ ARTHROSCOPY      Review of systems negative except as noted in HPI / PMHx or noted below:  Review of Systems  Constitutional: Negative.   HENT: Negative.    Eyes: Negative.   Respiratory: Negative.    Cardiovascular: Negative.   Gastrointestinal: Negative.   Genitourinary:  Negative.   Musculoskeletal: Negative.   Skin: Negative.   Neurological: Negative.   Endo/Heme/Allergies: Negative.   Psychiatric/Behavioral: Negative.       Objective:   Vitals:   11/02/21 0850  BP: 106/62  Pulse: 98  Resp: 18  Temp: 98 F (36.7 C)  SpO2: 98%          Physical Exam Constitutional:      Appearance: She is not diaphoretic.  HENT:     Head: Normocephalic.     Right Ear: Tympanic membrane, ear canal and external ear normal.     Left Ear: Tympanic membrane, ear canal and external ear normal.     Nose: Nose normal. No mucosal edema (Septal perforation with clear borders) or rhinorrhea.     Mouth/Throat:     Pharynx: Uvula midline. No oropharyngeal exudate.  Eyes:     Conjunctiva/sclera: Conjunctivae normal.  Neck:     Thyroid: No thyromegaly.     Trachea: Trachea normal. No tracheal tenderness or tracheal deviation.  Cardiovascular:     Rate and Rhythm: Normal rate and regular rhythm.     Heart sounds: Normal heart sounds, S1 normal and S2 normal. No murmur heard. Pulmonary:     Effort: No respiratory distress.     Breath sounds: Normal breath sounds. No stridor. No wheezing or rales.  Lymphadenopathy:     Head:     Right side of head: No tonsillar adenopathy.     Left side of head: No tonsillar adenopathy.     Cervical: No cervical adenopathy.  Skin:    Findings: No erythema or rash.     Nails: There is no clubbing.  Neurological:     Mental Status: She is alert.     Diagnostics: none   Assessment and Plan:   1. Perennial allergic rhinitis   2. Nasal septal perforation   3. LPRD (laryngopharyngeal reflux disease)   4. Dry eye syndrome of both eyes    1.  Continue to Treat and prevent reflux:   A.  Minimize all forms of caffeine consumption  B.  Omeprazole 40 mg -1 tablet 1-2 times a day  C.  Famotidine 40 mg -1 tablet in evening  2.  Can use nasal fluticasone - 1-2 sprays each nostril 3-7 times per week  3.  Remain away from all  oral steroids secondary to dry eye syndrome  4.  Return to clinic in 12 months or earlier if problem.   [name] appears to be doing quite well on her current plan which is to address her LPR with a combination of omeprazole and famotidine and address her upper airway issue with nasal fluticasone and continues to  have therapy regarding her dry eye from [facility] ophthalmology.  I will see her back in this clinic in 12 months or earlier if there is a problem.  Allena Katz, MD Allergy / Immunology [place]

## 2021-11-02 NOTE — Telephone Encounter (Signed)
..     Pre-operative Risk Assessment    Patient Name: Grace Taylor  DOB: 02-17-1960 MRN: [id number]      Request for Surgical Clearance    Procedure:   LEFT SHOULDER ARTHROSCOPY ROTATOR CUFF REPAIR AND BICEPS TENODESIS   Date of Surgery:  Clearance TBD                                 Surgeon:  DR Vanetta Mulders Surgeon's Group or Practice Name:  [facility] Phone number:  [phone] Fax number:  [phone]   Type of Clearance Requested:   - Medical  - Pharmacy:  Hold Aspirin     Type of Anesthesia:   REGIONAL BLOCK   Additional requests/questions:    Gwenlyn Found   11/02/2021, 2:52 PM

## 2021-11-02 NOTE — Telephone Encounter (Signed)
Pt agreeable to plan of care for tele pre op appt 11/04/21 @ 2:40 pm. Med rec and consent are done.     Patient Consent for Virtual Visit        Ahmya E Bringhurst has provided verbal consent on 11/02/2021 for a virtual visit (video or telephone).   CONSENT FOR VIRTUAL VISIT FOR:  Catharine E Mussell  By participating in this virtual visit I agree to the following:  I hereby voluntarily request, consent and authorize [facility] and its employed or contracted physicians, physician assistants, nurse practitioners or other licensed health care professionals (the Practitioner), to provide me with telemedicine health care services (the "Services") as deemed necessary by the treating Practitioner. I acknowledge and consent to receive the Services by the Practitioner via telemedicine. I understand that the telemedicine visit will involve communicating with the Practitioner through live audiovisual communication technology and the disclosure of certain medical information by electronic transmission. I acknowledge that I have been given the opportunity to request an in-person assessment or other available alternative prior to the telemedicine visit and am voluntarily participating in the telemedicine visit.  I understand that I have the right to withhold or withdraw my consent to the use of telemedicine in the course of my care at any time, without affecting my right to future care or treatment, and that the Practitioner or I may terminate the telemedicine visit at any time. I understand that I have the right to inspect all information obtained and/or recorded in the course of the telemedicine visit and may receive copies of available information for a reasonable fee.  I understand that some of the potential risks of receiving the Services via telemedicine include:  Delay or interruption in medical evaluation due to technological equipment failure or disruption; Information transmitted  may not be sufficient (e.g. poor resolution of images) to allow for appropriate medical decision making by the Practitioner; and/or  In rare instances, security protocols could fail, causing a breach of personal health information.  Furthermore, I acknowledge that it is my responsibility to provide information about my medical history, conditions and care that is complete and accurate to the best of my ability. I acknowledge that Practitioner's advice, recommendations, and/or decision may be based on factors not within their control, such as incomplete or inaccurate data provided by me or distortions of diagnostic images or specimens that may result from electronic transmissions. I understand that the practice of medicine is not an exact science and that Practitioner makes no warranties or guarantees regarding treatment outcomes. I acknowledge that a copy of this consent can be made available to me via my patient portal ([facility]), or I can request a printed copy by calling the office of [facility].    I understand that my insurance will be billed for this visit.   I have read or had this consent read to me. I understand the contents of this consent, which adequately explains the benefits and risks of the Services being provided via telemedicine.  I have been provided ample opportunity to ask questions regarding this consent and the Services and have had my questions answered to my satisfaction. I give my informed consent for the services to be provided through the use of telemedicine in my medical care

## 2021-11-02 NOTE — Telephone Encounter (Signed)
Pt agreeable to plan of care for tele pre op appt 11/04/21 @ 2:40 pm. Med rec and consent are done.

## 2021-11-02 NOTE — Patient Instructions (Signed)
  1.  Continue to Treat and prevent reflux:   A.  Minimize all forms of caffeine consumption  B.  Omeprazole 40 mg -1 tablet 1-2 times a day  C.  Famotidine 40 mg -1 tablet in evening  2.  Can use nasal fluticasone - 1-2 sprays each nostril 3-7 times per week  3.  Remain away from all oral steroids secondary to dry eye syndrome  4.  Return to clinic in 12 months or earlier if problem.

## 2021-11-02 NOTE — Telephone Encounter (Signed)
I spoke with patient, and advised we are waiting on Cardio clearance. Once received I will be giving her a call to schedule surgery.

## 2021-11-02 NOTE — Telephone Encounter (Signed)
   Name: Grace Taylor  DOB: Dec 17, 1960  MRN: [id number]  Primary Cardiologist: Minus Breeding, MD   Preoperative team, please contact this patient and set up a phone call appointment for further preoperative risk assessment. Please obtain consent and complete medication review. Thank you for your help.  I confirm that guidance regarding antiplatelet and oral anticoagulation therapy has been completed and, if necessary, noted below.  Request to hold ASA as well. Patient's PCP prescribes full-dose ASA.   Christell Faith, PA-C 11/02/2021, 3:10 PM [place]

## 2021-11-03 ENCOUNTER — Encounter: Payer: Self-pay | Admitting: Allergy and Immunology

## 2021-11-03 ENCOUNTER — Telehealth: Payer: Self-pay

## 2021-11-03 NOTE — Telephone Encounter (Signed)
   Pre-operative Risk Assessment    Patient Name: Grace Taylor  DOB: [birth year] MRN: [id number]      Request for Surgical Clearance    Procedure:   LEFT SHOULDER ARTHROSCOPY ROTATOR CUFF REPAIR AND BICEPS TENODESIS  Date of Surgery:  Clearance TBD                                 Surgeon:  DR Vanetta Mulders  Surgeon's Group or Practice Name:  [facility] Phone number:  [phone] Fax number:  [phone]   Type of Clearance Requested:   - Medical /CARDIAC CLEARANCE   Type of Anesthesia:   REGIONAL BLOCK   Additional requests/questions:  Please fax a copy of MEDICAL/CARDIAC CLEARANCE to the surgeon's office.  Signed, Jeanmarie Plant Roxanna Mcever CCMA  11/03/2021, 11:40 AM

## 2021-11-03 NOTE — Telephone Encounter (Signed)
   Name: Grace Taylor  DOB: [birth year]  MRN: [id number]  Primary Cardiologist: Minus Breeding, MD  Chart reviewed as part of pre-operative protocol coverage. Because of Anureet E Fraizer's past medical history and time since last visit, she will require a follow-up telephone visit in order to better assess preoperative cardiovascular risk.  Pre-op covering staff: - Please schedule appointment and call patient to inform them. If patient already had an upcoming appointment within acceptable timeframe, please add "pre-op clearance" to the appointment notes so provider is aware. - Please contact requesting surgeon's office via preferred method (i.e, phone, fax) to inform them of need for appointment prior to surgery.  No medications indicated as needing held.  Elgie Collard, PA-C  11/03/2021, 4:48 PM

## 2021-11-04 ENCOUNTER — Ambulatory Visit: Payer: Medicare HMO | Attending: Cardiology | Admitting: Nurse Practitioner

## 2021-11-04 DIAGNOSIS — Z01810 Encounter for preprocedural cardiovascular examination: Secondary | ICD-10-CM

## 2021-11-04 NOTE — Progress Notes (Signed)
Virtual Visit via Telephone Note   Because of Grace Taylor's co-morbid illnesses, she is at least at moderate risk for complications without adequate follow up.  This format is felt to be most appropriate for this patient at this time.  The patient did not have access to video technology/had technical difficulties with video requiring transitioning to audio format only (telephone).  All issues noted in this document were discussed and addressed.  No physical exam could be performed with this format.  Please refer to the patient's chart for her consent to telehealth for [facility].  Evaluation Performed:  Preoperative cardiovascular risk assessment _____________   Date:  11/04/2021   Patient ID:  Grace Taylor, DOB 1960/05/18, MRN [id number] Patient Location:  Home Provider location:   Office  Primary Care Provider:  [facility] Primary Cardiologist:  Minus Breeding, MD  Chief Complaint / Patient Profile   61 y.o. y/o female with a h/o elevated coronary calcium score, and hyperlipidemia who is pending left shoulder arthroscopy, rotator cuff repair, and biceps tenodesis with Dr. Vanetta Mulders of [facility] and presents today for telephonic preoperative cardiovascular risk assessment.  Past Medical History    Past Medical History:  Diagnosis Date   Allergy    Anemia    Benign colon polyp 04/07/2017   Q 5 year recall   Chronic neck pain - post injury, worker's comp 2013, 2018 04/07/2017   Dr. Mardelle Matte   Clostridium difficile infection    History of peptic ulcer 04/07/2017   Hyperlipidemia    IBS (irritable bowel syndrome) 04/07/2017   Colonoscopy by Carlean Purl   Migraines    PTSD (post-traumatic stress disorder) 04/07/2017   Ulcer    age 61   Past Surgical History:  Procedure Laterality Date   CHOLECYSTECTOMY  2012   COLONOSCOPY  2014   DG GALL BLADDER     2012   ESOPHAGOGASTRODUODENOSCOPY  2014   FLEXIBLE SIGMOIDOSCOPY   03/2014   OVARIAN CYST REMOVAL     right shoulder surgery     TMJ ARTHROSCOPY      Allergies  Allergies  Allergen Reactions   Codeine    Crestor [Rosuvastatin]     INEFFECTIVE AT LOWERING LDL   Lipitor [Atorvastatin]     INEFFECTIVE AT LOWERING LDL   Penicillins Hives   Simvastatin     ELEVATED LIVER ENZYMES   Sulfonamide Derivatives Nausea And Vomiting and Rash    History of Present Illness    Grace Taylor is a 61 y.o. female who presents via [employer], civil (consulting) for a telehealth visit today.  Pt was last seen in cardiology clinic on 08/27/2021 by Dr. Percival Spanish.  At that time Kaylean E Faidley was doing well.  The patient is now pending procedure as outlined above. Since her last visit, she has been stable from a cardiac standpoint. She denies chest pain, palpitations, dyspnea, pnd, orthopnea, n, v, dizziness, syncope, edema, weight gain, or early satiety. All other systems reviewed and are otherwise negative except as noted above.   Home Medications    Prior to Admission medications   Medication Sig Start Date End Date Taking? Authorizing Provider  acetaminophen (TYLENOL) 500 MG tablet Take 1 tablet (500 mg total) by mouth every 8 (eight) hours for 10 days. 10/27/21 11/06/21  Vanetta Mulders, MD  Alirocumab (PRALUENT) 150 MG/ML SOAJ INJECT 150 MG INTO THE SKIN EVERY 14 DAYS. 06/29/21   Minus Breeding, MD  aspirin EC 325 MG tablet Take  1 tablet (325 mg total) by mouth daily. Patient not taking: Reported on 11/02/2021 10/27/21   Vanetta Mulders, MD  clonazePAM (KLONOPIN) 0.5 MG tablet Take 1 tablet by mouth 2 (two) times daily. 09/19/16   [provider]  ezetimibe (ZETIA) 10 MG tablet Take 1 tablet (10 mg total) by mouth daily. 08/27/21   Minus Breeding, MD  famotidine (PEPCID) 40 MG tablet Take 1 tablet (40 mg total) by mouth every evening. 11/02/21   Kozlow, Donnamarie Poag, MD  fluticasone (FLONASE) 50 MCG/ACT nasal spray Place 2 sprays into both nostrils  daily as needed for allergies or rhinitis. 08/14/15   [provider]  ibuprofen (ADVIL) 800 MG tablet Take 1 tablet (800 mg total) by mouth every 8 (eight) hours for 10 days. Please take with food, please alternate with acetaminophen 10/27/21 11/06/21  Vanetta Mulders, MD  Multiple Vitamin (MULITIVITAMIN WITH MINERALS) TABS Take 1 tablet by mouth daily.    [provider]  Omega-3 1000 MG CAPS Omega 3    [provider]  omeprazole (PRILOSEC) 40 MG capsule TAKE 1 CAPSULE BY MOUTH IN THE MORNING AND AT BEDTIME. 11/02/21   Kozlow, Donnamarie Poag, MD  Ophthalmic Irrigation Solution (EYE [place] OP) Apply to eye as needed.    [provider]  oxycodone (OXY-IR) 5 MG capsule Take 1 capsule (5 mg total) by mouth every 4 (four) hours as needed (severe pain). Patient not taking: Reported on 11/02/2021 10/27/21   Vanetta Mulders, MD  phentermine (ADIPEX-P) 37.5 MG tablet Take 18.75-37.5 mg by mouth as needed. 03/29/19   [provider]  zolpidem (AMBIEN) 10 MG tablet Take 10 mg by mouth at bedtime.  09/03/15   [provider]    Physical Exam    Vital Signs:  Maridee E Looper does not have vital signs available for review today.  Given telephonic nature of communication, physical exam is limited. AAOx3. NAD. Normal affect.  Speech and respirations are unlabored.  Accessory Clinical Findings    None  Assessment & Plan    1.  Preoperative Cardiovascular Risk Assessment:  According to the Revised Cardiac Risk Index (RCRI), her Perioperative Risk of Major Cardiac Event is (%): 0.4. Her Functional Capacity in METs is: 7.01 according to the [place] Activity Status Index (DASI). Therefore, based on ACC/AHA guidelines, patient would be at acceptable risk for the planned procedure without further cardiovascular testing.  The patient was advised that if she develops new symptoms prior to surgery to contact our office to arrange for a follow-up visit, and she  verbalized understanding.  A copy of this note will be routed to requesting surgeon.  Time:   Today, I have spent 4 minutes with the patient with telehealth technology discussing medical history, symptoms, and management plan.     Lenna Sciara, NP  11/04/2021, 3:20 PM

## 2021-11-04 NOTE — Telephone Encounter (Signed)
Pt is already scheduled for a tele visit today.

## 2021-11-05 ENCOUNTER — Telehealth: Payer: Self-pay | Admitting: Orthopaedic Surgery

## 2021-11-05 NOTE — Telephone Encounter (Signed)
I;ll plan to send hydrocone on day of surgery, no problem

## 2021-11-05 NOTE — Telephone Encounter (Signed)
Spoke to patient and relayed message from Dr. Sammuel Hines

## 2021-11-05 NOTE — Telephone Encounter (Signed)
I spoke with patient today about scheduling surgery. Patient mentioned being prescribed Oxycodone for after surgery. Patient is concerned about medication, and if it has Codeine in it. Patient states Codeine "winds her up". She feels very anxious, and restless when she takes that. Patient concerned she may feel that way while taking Oxycodone. Would you please call patient to discuss?

## 2021-11-15 ENCOUNTER — Ambulatory Visit (HOSPITAL_BASED_OUTPATIENT_CLINIC_OR_DEPARTMENT_OTHER): Payer: Self-pay | Admitting: Orthopaedic Surgery

## 2021-11-15 DIAGNOSIS — S46012A Strain of muscle(s) and tendon(s) of the rotator cuff of left shoulder, initial encounter: Secondary | ICD-10-CM

## 2021-11-15 NOTE — Progress Notes (Signed)
Surgical Instructions    Your procedure is scheduled on Monday, 11/22/21.  Report to [facility] Main Entrance "A" at 12:10 P.M., then check in with the Admitting office.  Call this number if you have problems the morning of surgery:  [phone]   If you have any questions prior to your surgery date call [phone]: Open Monday-Friday 8am-4pm If you experience any cold or flu symptoms such as cough, fever, chills, shortness of breath, etc. between now and your scheduled surgery, please notify us at the above number     Remember:  Do not eat after midnight the night before your surgery  You may drink clear liquids until 11:10am the morning of your surgery.   Clear liquids allowed are: Water, Non-Citrus Juices (without pulp), Carbonated Beverages, Clear Tea, Black Coffee ONLY (NO MILK, CREAM OR POWDERED CREAMER of any kind), and Gatorade    Take these medicines the morning of surgery with A SIP OF WATER:  clonazePAM (KLONOPIN)  ezetimibe (ZETIA)  omeprazole (PRILOSEC)   IF NEEDED: Carboxymethylcellulose Sodium (THERATEARS OP)  famotidine (PEPCID)  fluticasone (FLONASE)  oxycodone (OXY-IR)  Polyethyl Glycol-Propyl Glycol (SYSTANE OP)   As of today, STOP taking any Aspirin (unless otherwise instructed by your surgeon) Aleve, Naproxen, Ibuprofen, Motrin, Advil, Goody's, BC's, all herbal medications, fish oil, and all vitamins.           Do not wear jewelry or makeup. Do not wear lotions, powders, perfumes or deodorant. Do not shave 48 hours prior to surgery.   Do not bring valuables to the hospital. Do not wear nail polish, gel polish, artificial nails, or any other type of covering on natural nails (fingers and toes) If you have artificial nails or gel coating that need to be removed by a nail salon, please have this removed prior to surgery. Artificial nails or gel coating may interfere with anesthesia's ability to adequately monitor your vital signs.  [facility] is not  responsible for any belongings or valuables.    Do NOT Smoke (Tobacco/Vaping)  24 hours prior to your procedure  If you use a CPAP at night, you may bring your mask for your overnight stay.   Contacts, glasses, hearing aids, dentures or partials may not be worn into surgery, please bring cases for these belongings   For patients admitted to the hospital, discharge time will be determined by your treatment team.   Patients discharged the day of surgery will not be allowed to drive home, and someone needs to stay with them for 24 hours.   SURGICAL WAITING ROOM VISITATION Patients having surgery or a procedure may have no more than 2 support people in the waiting area - these visitors may rotate.   Children under the age of 94 must have an adult with them who is not the patient. If the patient needs to stay at the hospital during part of their recovery, the visitor guidelines for inpatient rooms apply. Pre-op nurse will coordinate an appropriate time for 1 support person to accompany patient in pre-op.  This support person may not rotate.   Please refer to [email or web address] for the visitor guidelines for Inpatients (after your surgery is over and you are in a regular room).    Special instructions:    Oral Hygiene is also important to reduce your risk of infection.  Remember - BRUSH YOUR TEETH THE MORNING OF SURGERY WITH YOUR REGULAR TOOTHPASTE   [place]- Preparing For Surgery  Before surgery, you can play an important role. Because  skin is not sterile, your skin needs to be as free of germs as possible. You can reduce the number of germs on your skin by washing with CHG (chlorahexidine gluconate) Soap before surgery.  CHG is an antiseptic cleaner which kills germs and bonds with the skin to continue killing germs even after washing.     Please do not use if you have an allergy to CHG or antibacterial soaps. If your skin becomes  reddened/irritated stop using the CHG.  Do not shave (including legs and underarms) for at least 48 hours prior to first CHG shower. It is OK to shave your face.  Please follow these instructions carefully.     Shower the NIGHT BEFORE SURGERY and the MORNING OF SURGERY with CHG Soap.   If you chose to wash your hair, wash your hair first as usual with your normal shampoo. After you shampoo, rinse your hair and body thoroughly to remove the shampoo.  Then [employer] and genitals (private parts) with your normal soap and rinse thoroughly to remove soap.  After that Use CHG Soap as you would any other liquid soap. You can apply CHG directly to the skin and wash gently with a scrungie or a clean washcloth.   Apply the CHG Soap to your body ONLY FROM THE NECK DOWN.  Do not use on open wounds or open sores. Avoid contact with your eyes, ears, mouth and genitals (private parts). Wash Face and genitals (private parts)  with your normal soap.   Wash thoroughly, paying special attention to the area where your surgery will be performed.  Thoroughly rinse your body with warm water from the neck down.  DO NOT shower/wash with your normal soap after using and rinsing off the CHG Soap.  Pat yourself dry with a CLEAN TOWEL.  Wear CLEAN PAJAMAS to bed the night before surgery  Place CLEAN SHEETS on your bed the night before your surgery  DO NOT SLEEP WITH PETS.   Day of Surgery: Take a shower with CHG soap. Wear Clean/Comfortable clothing the morning of surgery Do not apply any deodorants/lotions.   Remember to brush your teeth WITH YOUR REGULAR TOOTHPASTE.    If you received a COVID test during your pre-op visit, it is requested that you wear a mask when out in public, stay away from anyone that may not be feeling well, and notify your surgeon if you develop symptoms. If you have been in contact with anyone that has tested positive in the last 10 days, please notify your surgeon.    Please read  over the following fact sheets that you were given.

## 2021-11-16 ENCOUNTER — Encounter (HOSPITAL_COMMUNITY)
Admission: RE | Admit: 2021-11-16 | Discharge: 2021-11-16 | Disposition: A | Discharge status: Home / Self Care | Payer: Medicare HMO | Source: Ambulatory Visit | Attending: Orthopaedic Surgery | Admitting: Orthopaedic Surgery

## 2021-11-16 ENCOUNTER — Other Ambulatory Visit: Payer: Self-pay

## 2021-11-16 ENCOUNTER — Encounter (HOSPITAL_COMMUNITY): Payer: Self-pay

## 2021-11-16 VITALS — BP 131/74 | HR 80 | Temp 97.8°F | Resp 18 | Ht 66.0 in | Wt 165.5 lb

## 2021-11-16 DIAGNOSIS — E785 Hyperlipidemia, unspecified: Secondary | ICD-10-CM

## 2021-11-16 DIAGNOSIS — Z01812 Encounter for preprocedural laboratory examination: Secondary | ICD-10-CM | POA: Insufficient documentation

## 2021-11-16 LAB — CBC
HCT: 40.3 % (ref 36.0–46.0)
Hemoglobin: 13.4 g/dL (ref 12.0–15.0)
MCH: 31.4 pg (ref 26.0–34.0)
MCHC: 33.3 g/dL (ref 30.0–36.0)
MCV: 94.4 fL (ref 80.0–100.0)
Platelets: 244 10*3/uL (ref 150–400)
RBC: 4.27 MIL/uL (ref 3.87–5.11)
RDW: 12.7 % (ref 11.5–15.5)
WBC: 6.9 10*3/uL (ref 4.0–10.5)
nRBC: 0 % (ref 0.0–0.2)

## 2021-11-16 NOTE — Progress Notes (Signed)
PCP -[facility] Cardiologist - Dr. Percival Spanish (Sees d/t family history)   PPM/ICD - Denies  Chest x-ray - NI EKG - 08/27/21 Stress Test - 05/13/20 ECHO - Denies Cardiac Cath - Denies  Sleep Study - Denies   DM - Denies  Blood Thinner Instructions:Denies Aspirin Instructions:Denies  COVID TEST- NI   Anesthesia review: No  Patient denies shortness of breath, fever, cough and chest pain at PAT appointment   All instructions explained to the patient, with a verbal understanding of the material. Patient agrees to go over the instructions while at home for a better understanding. The opportunity to ask questions was provided.

## 2021-11-22 ENCOUNTER — Encounter (HOSPITAL_COMMUNITY)
Admission: RE | Disposition: A | Discharge status: Home / Self Care | Payer: Self-pay | Source: Ambulatory Visit | Attending: Orthopaedic Surgery

## 2021-11-22 ENCOUNTER — Ambulatory Visit (HOSPITAL_COMMUNITY)
Admission: RE | Admit: 2021-11-22 | Discharge: 2021-11-22 | Disposition: A | Discharge status: Home / Self Care | Payer: Medicare HMO | Source: Ambulatory Visit | Attending: Orthopaedic Surgery | Admitting: Orthopaedic Surgery

## 2021-11-22 ENCOUNTER — Ambulatory Visit (HOSPITAL_BASED_OUTPATIENT_CLINIC_OR_DEPARTMENT_OTHER): Payer: Medicare HMO | Admitting: Anesthesiology

## 2021-11-22 ENCOUNTER — Other Ambulatory Visit: Payer: Self-pay

## 2021-11-22 ENCOUNTER — Ambulatory Visit (HOSPITAL_COMMUNITY): Payer: Medicare HMO | Admitting: Anesthesiology

## 2021-11-22 ENCOUNTER — Encounter (HOSPITAL_COMMUNITY): Payer: Self-pay | Admitting: Orthopaedic Surgery

## 2021-11-22 DIAGNOSIS — D649 Anemia, unspecified: Secondary | ICD-10-CM | POA: Diagnosis not present

## 2021-11-22 DIAGNOSIS — M25712 Osteophyte, left shoulder: Secondary | ICD-10-CM | POA: Insufficient documentation

## 2021-11-22 DIAGNOSIS — K219 Gastro-esophageal reflux disease without esophagitis: Secondary | ICD-10-CM | POA: Insufficient documentation

## 2021-11-22 DIAGNOSIS — F419 Anxiety disorder, unspecified: Secondary | ICD-10-CM | POA: Diagnosis not present

## 2021-11-22 DIAGNOSIS — M75122 Complete rotator cuff tear or rupture of left shoulder, not specified as traumatic: Secondary | ICD-10-CM

## 2021-11-22 DIAGNOSIS — I2510 Atherosclerotic heart disease of native coronary artery without angina pectoris: Secondary | ICD-10-CM | POA: Diagnosis not present

## 2021-11-22 DIAGNOSIS — D759 Disease of blood and blood-forming organs, unspecified: Secondary | ICD-10-CM | POA: Insufficient documentation

## 2021-11-22 DIAGNOSIS — S46012A Strain of muscle(s) and tendon(s) of the rotator cuff of left shoulder, initial encounter: Secondary | ICD-10-CM | POA: Diagnosis not present

## 2021-11-22 DIAGNOSIS — R69 Illness, unspecified: Secondary | ICD-10-CM | POA: Diagnosis not present

## 2021-11-22 DIAGNOSIS — G8918 Other acute postprocedural pain: Secondary | ICD-10-CM | POA: Diagnosis not present

## 2021-11-22 HISTORY — PX: SHOULDER ARTHROSCOPY WITH ROTATOR CUFF REPAIR AND OPEN BICEPS TENODESIS: SHX6677

## 2021-11-22 SURGERY — SHOULDER ARTHROSCOPY WITH ROTATOR CUFF REPAIR AND OPEN BICEPS TENODESIS
Anesthesia: Regional | Site: Shoulder | Laterality: Left

## 2021-11-22 MED ORDER — ROCURONIUM BROMIDE 10 MG/ML (PF) SYRINGE
PREFILLED_SYRINGE | INTRAVENOUS | Status: DC | PRN
  Administered 2021-11-22: 20 mg via INTRAVENOUS
  Administered 2021-11-22: 50 mg via INTRAVENOUS
  Administered 2021-11-22: 30 mg via INTRAVENOUS

## 2021-11-22 MED ORDER — MIDAZOLAM HCL 2 MG/2ML IJ SOLN
INTRAMUSCULAR | Status: AC
  Administered 2021-11-22: 2 mg via INTRAVENOUS
  Filled 2021-11-22: qty 2

## 2021-11-22 MED ORDER — CHLORHEXIDINE GLUCONATE 0.12 % MT SOLN: 15.0000 mL | Freq: Once | OROMUCOSAL | Status: AC

## 2021-11-22 MED ORDER — LACTATED RINGERS IV SOLN: INTRAVENOUS | Status: DC

## 2021-11-22 MED ORDER — ONDANSETRON HCL 4 MG/2ML IJ SOLN
INTRAMUSCULAR | Status: DC | PRN
  Administered 2021-11-22: 4 mg via INTRAVENOUS

## 2021-11-22 MED ORDER — HYDROMORPHONE HCL 1 MG/ML IJ SOLN: 0.2500 mg | INTRAMUSCULAR | Status: DC | PRN

## 2021-11-22 MED ORDER — BUPIVACAINE HCL (PF) 0.25 % IJ SOLN
INTRAMUSCULAR | Status: AC
  Filled 2021-11-22: qty 30

## 2021-11-22 MED ORDER — EPINEPHRINE PF 1 MG/ML IJ SOLN
INTRAMUSCULAR | Status: AC
  Filled 2021-11-22: qty 2

## 2021-11-22 MED ORDER — OXYCODONE HCL 5 MG/5ML PO SOLN: 5.0000 mg | Freq: Once | ORAL | Status: DC | PRN

## 2021-11-22 MED ORDER — GABAPENTIN 300 MG PO CAPS
ORAL_CAPSULE | ORAL | Status: AC
  Administered 2021-11-22: 300 mg via ORAL
  Filled 2021-11-22: qty 1

## 2021-11-22 MED ORDER — SODIUM CHLORIDE 0.9 % IR SOLN
Status: DC | PRN
  Administered 2021-11-22 (×5): 6000 mL
  Administered 2021-11-22: 3000 mL

## 2021-11-22 MED ORDER — PHENYLEPHRINE 80 MCG/ML (10ML) SYRINGE FOR IV PUSH (FOR BLOOD PRESSURE SUPPORT)
PREFILLED_SYRINGE | INTRAVENOUS | Status: DC | PRN
  Administered 2021-11-22: 80 ug via INTRAVENOUS
  Administered 2021-11-22 (×2): 160 ug via INTRAVENOUS

## 2021-11-22 MED ORDER — TRANEXAMIC ACID-NACL 1000-0.7 MG/100ML-% IV SOLN
1000.0000 mg | INTRAVENOUS | Status: AC
  Administered 2021-11-22: 1000 mg via INTRAVENOUS

## 2021-11-22 MED ORDER — SODIUM CHLORIDE 0.9 % IR SOLN
Status: DC | PRN
  Administered 2021-11-22 (×2): 1 mL

## 2021-11-22 MED ORDER — PHENYLEPHRINE HCL-NACL 20-0.9 MG/250ML-% IV SOLN
INTRAVENOUS | Status: DC | PRN
  Administered 2021-11-22: 40 ug/min via INTRAVENOUS

## 2021-11-22 MED ORDER — MIDAZOLAM HCL 2 MG/2ML IJ SOLN: 2.0000 mg | Freq: Once | INTRAMUSCULAR | Status: AC

## 2021-11-22 MED ORDER — FENTANYL CITRATE (PF) 250 MCG/5ML IJ SOLN
INTRAMUSCULAR | Status: DC | PRN
  Administered 2021-11-22: 100 ug via INTRAVENOUS

## 2021-11-22 MED ORDER — ACETAMINOPHEN 500 MG PO TABS: 1000.0000 mg | ORAL_TABLET | Freq: Once | ORAL | Status: AC

## 2021-11-22 MED ORDER — OXYCODONE HCL 5 MG PO TABS: 5.0000 mg | ORAL_TABLET | Freq: Once | ORAL | Status: DC | PRN

## 2021-11-22 MED ORDER — CEFAZOLIN SODIUM-DEXTROSE 2-4 GM/100ML-% IV SOLN
INTRAVENOUS | Status: AC
  Filled 2021-11-22: qty 100

## 2021-11-22 MED ORDER — ORAL CARE MOUTH RINSE: 15.0000 mL | Freq: Once | OROMUCOSAL | Status: AC

## 2021-11-22 MED ORDER — FENTANYL CITRATE PF 50 MCG/ML IJ SOSY
50.0000 ug | PREFILLED_SYRINGE | Freq: Once | INTRAMUSCULAR | Status: AC
  Administered 2021-11-22: 50 ug via INTRAVENOUS
  Filled 2021-11-22: qty 1

## 2021-11-22 MED ORDER — ACETAMINOPHEN 500 MG PO TABS
ORAL_TABLET | ORAL | Status: AC
  Administered 2021-11-22: 1000 mg via ORAL
  Filled 2021-11-22: qty 2

## 2021-11-22 MED ORDER — LIDOCAINE 2% (20 MG/ML) 5 ML SYRINGE
INTRAMUSCULAR | Status: DC | PRN
  Administered 2021-11-22: 40 mg via INTRAVENOUS

## 2021-11-22 MED ORDER — 0.9 % SODIUM CHLORIDE (POUR BTL) OPTIME
TOPICAL | Status: DC | PRN
  Administered 2021-11-22: 1000 mL

## 2021-11-22 MED ORDER — AMISULPRIDE (ANTIEMETIC) 5 MG/2ML IV SOLN: 10.0000 mg | Freq: Once | INTRAVENOUS | Status: DC | PRN

## 2021-11-22 MED ORDER — FENTANYL CITRATE (PF) 100 MCG/2ML IJ SOLN
INTRAMUSCULAR | Status: AC
  Filled 2021-11-22: qty 2

## 2021-11-22 MED ORDER — ONDANSETRON HCL 4 MG/2ML IJ SOLN: 4.0000 mg | Freq: Once | INTRAMUSCULAR | Status: DC | PRN

## 2021-11-22 MED ORDER — FENTANYL CITRATE (PF) 250 MCG/5ML IJ SOLN
INTRAMUSCULAR | Status: AC
  Filled 2021-11-22: qty 5

## 2021-11-22 MED ORDER — CHLORHEXIDINE GLUCONATE 0.12 % MT SOLN
OROMUCOSAL | Status: AC
  Administered 2021-11-22: 15 mL via OROMUCOSAL
  Filled 2021-11-22: qty 15

## 2021-11-22 MED ORDER — CEFAZOLIN SODIUM-DEXTROSE 2-4 GM/100ML-% IV SOLN
2.0000 g | INTRAVENOUS | Status: AC
  Administered 2021-11-22: 2 g via INTRAVENOUS

## 2021-11-22 MED ORDER — GABAPENTIN 300 MG PO CAPS: 300.0000 mg | ORAL_CAPSULE | Freq: Once | ORAL | Status: AC

## 2021-11-22 MED ORDER — PROPOFOL 10 MG/ML IV BOLUS
INTRAVENOUS | Status: AC
  Filled 2021-11-22: qty 20

## 2021-11-22 MED ORDER — PROPOFOL 10 MG/ML IV BOLUS
INTRAVENOUS | Status: DC | PRN
  Administered 2021-11-22: 140 mg via INTRAVENOUS

## 2021-11-22 MED ORDER — SUGAMMADEX SODIUM 200 MG/2ML IV SOLN
INTRAVENOUS | Status: DC | PRN
  Administered 2021-11-22: 300.4 mg via INTRAVENOUS

## 2021-11-22 MED ORDER — BUPIVACAINE LIPOSOME 1.3 % IJ SUSP
INTRAMUSCULAR | Status: DC | PRN
  Administered 2021-11-22: 10 mL via PERINEURAL

## 2021-11-22 MED ORDER — BUPIVACAINE HCL (PF) 0.5 % IJ SOLN
INTRAMUSCULAR | Status: DC | PRN
  Administered 2021-11-22: 15 mL via PERINEURAL

## 2021-11-22 MED ORDER — DEXAMETHASONE SODIUM PHOSPHATE 10 MG/ML IJ SOLN
INTRAMUSCULAR | Status: DC | PRN
  Administered 2021-11-22: 10 mg via INTRAVENOUS

## 2021-11-22 MED ORDER — TRANEXAMIC ACID-NACL 1000-0.7 MG/100ML-% IV SOLN
INTRAVENOUS | Status: AC
  Filled 2021-11-22: qty 100

## 2021-11-22 SURGICAL SUPPLY — 77 items
AID PSTN UNV HD RSTRNT DISP (MISCELLANEOUS) ×1
ANCH SUT 2.6 FBRTK 1.7 KNTLS (Anchor) ×1 IMPLANT
ANCH SUT 3.5 SELFPUNCH STRL (Anchor) ×2 IMPLANT
ANCH SUT 5 FBRTK 2.6 KNTLS SLF (Anchor) ×1 IMPLANT
ANCH SUT FBRTK 1.3X2.6X1.7 SLF (Anchor) ×2 IMPLANT
ANCH SUT SWLK 19.1X4.75 (Anchor) ×2 IMPLANT
ANCHOR FIBERTAK 2.6X1.7 BLUE (Anchor) IMPLANT
ANCHOR PSHLK BCMP PEK 3.5X19.5 (Anchor) IMPLANT
ANCHOR PUSHLOCK PEEK 3.5X19.5 (Anchor) ×2 IMPLANT
ANCHOR SUT BIO SW 4.75X19.1 (Anchor) IMPLANT
ANCHOR SUT FBRTK 2.6 SP #5 (Anchor) IMPLANT
APL PRP STRL LF DISP 70% ISPRP (MISCELLANEOUS) ×1
BAG COUNTER SPONGE SURGICOUNT (BAG) ×2 IMPLANT
BAG SPNG CNTER NS LX DISP (BAG) ×1
BLADE EXCALIBUR 4.0X13 (MISCELLANEOUS) ×2 IMPLANT
BLADE SURG 11 STRL SS (BLADE) IMPLANT
CANNULA 5.75X71 LONG (CANNULA) ×2 IMPLANT
CANNULA BUTTON PASSPORT (MISCELLANEOUS) IMPLANT
CANNULA TWIST IN 8.25X7CM (CANNULA) ×2 IMPLANT
CHLORAPREP W/TINT 26 (MISCELLANEOUS) ×2 IMPLANT
COOLER ICEMAN CLASSIC (MISCELLANEOUS) ×2 IMPLANT
COVER SURGICAL LIGHT HANDLE (MISCELLANEOUS) ×2 IMPLANT
CUTTER TENSIONER SUT 2-0 0 FBW (INSTRUMENTS) IMPLANT
DRAPE HALF SHEET 40X57 (DRAPES) ×2 IMPLANT
DRAPE INCISE IOBAN 66X45 STRL (DRAPES) ×2 IMPLANT
DRAPE SHOULDER BEACH CHAIR (DRAPES) ×2 IMPLANT
DRAPE U-SHAPE 47X51 STRL (DRAPES) ×4 IMPLANT
DRSG TEGADERM 4X4.75 (GAUZE/BANDAGES/DRESSINGS) ×6 IMPLANT
DW OUTFLOW CASSETTE/TUBE SET (MISCELLANEOUS) ×2 IMPLANT
GAUZE SPONGE 4X4 12PLY STRL (GAUZE/BANDAGES/DRESSINGS) ×2 IMPLANT
GAUZE SPONGE 4X4 12PLY STRL LF (GAUZE/BANDAGES/DRESSINGS) IMPLANT
GAUZE XEROFORM 1X8 LF (GAUZE/BANDAGES/DRESSINGS) ×2 IMPLANT
GLOVE BIOGEL PI IND STRL 8 (GLOVE) ×2 IMPLANT
GLOVE ECLIPSE 6.0 STRL STRAW (GLOVE) ×2 IMPLANT
GLOVE INDICATOR 8.0 STRL GRN (GLOVE) ×2 IMPLANT
GLOVE SURG UNDER POLY LF SZ6.5 (GLOVE) ×2 IMPLANT
GOWN STRL REUS W/ TWL LRG LVL3 (GOWN DISPOSABLE) ×4 IMPLANT
GOWN STRL REUS W/TWL LRG LVL3 (GOWN DISPOSABLE) ×2
GRAFT TISS 20X25 1 THK DERM (Tissue) IMPLANT
IMPL FIBERTAK KNTLS 2.6 (Anchor) IMPLANT
IMPLANT FIBERSTITCH CVD (Anchor) IMPLANT
IMPLANT FIBERTAK KNTLS 2.6 (Anchor) ×1 IMPLANT
KIT BASIN OR (CUSTOM PROCEDURE TRAY) ×2 IMPLANT
KIT STABILIZATION SHOULDER (MISCELLANEOUS) ×2 IMPLANT
KIT TURNOVER KIT B (KITS) ×2 IMPLANT
MANIFOLD NEPTUNE II (INSTRUMENTS) ×2 IMPLANT
NDL HD SCORPION MEGA LOADER (NEEDLE) IMPLANT
NDL SCORPION MULTI FIRE (NEEDLE) IMPLANT
NDL SPNL 18GX3.5 QUINCKE PK (NEEDLE) ×2 IMPLANT
NEEDLE SCORPION MULTI FIRE (NEEDLE) IMPLANT
NEEDLE SPNL 18GX3.5 QUINCKE PK (NEEDLE) ×1 IMPLANT
NS IRRIG 1000ML POUR BTL (IV SOLUTION) ×2 IMPLANT
PACK ARTHROSCOPY DSU (CUSTOM PROCEDURE TRAY) ×2 IMPLANT
PACK SHOULDER (CUSTOM PROCEDURE TRAY) ×2 IMPLANT
PAD ABD 8X10 STRL (GAUZE/BANDAGES/DRESSINGS) IMPLANT
PAD ARMBOARD 7.5X6 YLW CONV (MISCELLANEOUS) ×4 IMPLANT
PORT APPOLLO RF 90DEGREE MULTI (SURGICAL WAND) ×2 IMPLANT
PORTAL SKID DEVICE (INSTRUMENTS) IMPLANT
RESTRAINT HEAD UNIVERSAL NS (MISCELLANEOUS) ×2 IMPLANT
SLING ARM IMMOBILIZER LRG (SOFTGOODS) IMPLANT
SPONGE T-LAP 4X18 ~~LOC~~+RFID (SPONGE) ×2 IMPLANT
SPREADER GRAFT (MISCELLANEOUS) IMPLANT
SUT 0 FIBERLINK 1.5 FWIRE CLS (SUTURE) ×1
SUT 0 FIBERLOOP 38 BLUE TPR ND (SUTURE) ×2
SUT ETHILON 3 0 PS 1 (SUTURE) ×2 IMPLANT
SUTURE 0 FIBERLP 38 BLU TPR ND (SUTURE) IMPLANT
SUTURE 0 FIBRLNK 1.5 FWIRE CLS (SUTURE) IMPLANT
SUTURE TAPE FIBERLINK 1.3 LOOP (SUTURE) IMPLANT
SUTURETAPE FIBERLINK 1.3 LOOP (SUTURE) ×3
SYR 30ML LL (SYRINGE) ×2 IMPLANT
TAPE CLOTH SURG 4X10 WHT LF (GAUZE/BANDAGES/DRESSINGS) IMPLANT
TISSUE ARTHROFLEX THICK 4 (Tissue) ×1 IMPLANT
TOWEL GREEN STERILE (TOWEL DISPOSABLE) ×2 IMPLANT
TOWEL GREEN STERILE FF (TOWEL DISPOSABLE) ×2 IMPLANT
TUBE CONNECTING 12X1/4 (SUCTIONS) ×2 IMPLANT
TUBING ARTHROSCOPY IRRIG 16FT (MISCELLANEOUS) ×2 IMPLANT
WATER STERILE IRR 1000ML POUR (IV SOLUTION) ×2 IMPLANT

## 2021-11-22 NOTE — Op Note (Signed)
Date of Surgery: 11/22/2021  INDICATIONS: Mr. Lippe is a 61 y.o.-year-old adult with left shoulder full-thickness rotator cuff tear which is failed conservative management.  The risk and benefits of the procedure were discussed in detail and documented in the pre-operative evaluation.   PREOPERATIVE DIAGNOSES: Left shoulder, acute on chronic rotator cuff tear.  POSTOPERATIVE DIAGNOSIS: Same.  PROCEDURE: Arthroscopic limited debridement - 88416 Arthroscopic subacromial decompression - 60630 Arthroscopic rotator cuff repair - 16010 Arthroscopic biceps tenodesis - 93235  SURGEON: Yevonne Pax MD  ASSISTANT: Raynelle Fanning, ATC  ANESTHESIA:  general plus interscalene nerve block  IV FLUIDS AND URINE: See anesthesia record.  ANTIBIOTICS: Ancef  ESTIMATED BLOOD LOSS: 10 mL.  IMPLANTS:  Implant Name Type Inv. Item Serial No. Manufacturer Lot No. LRB No. Used Action  ANCHOR SUT BIO SW 4.75X19.1 - [id number] Anchor ANCHOR SUT BIO SW 4.75X19.1  [employer] [id number] Left 2 Implanted  ANCHOR SUT FBRTK 2.6 SP #5 - [id number] Anchor ANCHOR SUT FBRTK 2.6 SP #5  [employer] [id number] Left 1 Implanted  TISSUE ARTHROFLEX THICK 4 - [id number] Tissue TISSUE ARTHROFLEX THICK 4 [id number] [facility]  Left 1 Implanted  ANCHOR FIBERTAK 2.6X1.7 BLUE - [id number] Anchor ANCHOR FIBERTAK 2.6X1.7 BLUE  [employer] [id number] Left 1 Implanted  FiberStitch Implant Curved    [employer] [id number] Left 2 Implanted  ANCHOR PUSHLOCK PEEK 3.5X19.5 - [id number] Anchor ANCHOR PUSHLOCK PEEK 3.5X19.5  [employer] [id number] Left 1 Implanted  ANCHOR PUSHLOCK PEEK 3.5X19.5 - [id number] Anchor ANCHOR PUSHLOCK PEEK 3.5X19.5  [employer] [id number] Left 1 Implanted    DRAINS: None  CULTURES: None  COMPLICATIONS: none  PROCEDURE:    OPERATIVE FINDING: Exam under anesthesia:   Examination under anesthesia revealed forward elevation of 150 degrees.  With the arm at the side, there was 65 degrees of  external rotation.  There is a 1+ anterior load shift and a 1+ posterior load shift.    Arthroscopic findings demonstrated: Articular space: Normal Chondral surfaces: Normal Biceps: Significant fraying and flattening consistent with degeneration Subscapularis: Intact Supraspinatus: Full-thickness tear Infraspinatus: Intact    I identified the patient in the pre-operative holding area.  I marked the operative right shoulder with my initials. I reviewed the risks and benefits of the proposed surgical intervention and the patient wished to proceed.  Anesthesia was then performed with regional block.  The patient was transferred to the operative suite and placed in the beach chair position with all bony prominences padded.     SCDs were placed on bilateral lower extremity. Appropriate antibiotics was administered within 1 hour before incision.  Anesthesia was induced.  The operative extremity was then prepped and draped in standard fashion. A time out was performed confirming the correct extremity, correct patient and correct procedure.   The arthroscope was introduced in the glenohumeral joint from a posterior portal.  An anterior portal was created.  The shoulder was examined and the above findings were noted.     With an arthroscopic shaver and a wand ablator, synovitis throughout the  shoulder was resected.  The arthroscopic shaver was used to excise torn portions of the labrum back to a stable margin. Specifically this was done for the anterior superior and posterior labrum.  A shaver was introduced into the anterior portal and used to debride the undersurface of the acromion and remove impinging osteophyte.  This was done in the subacromial space.  At this time the biceps was tagged with a self passing device with a #2  nonabsorbable suture.  This was done through the anterior portal.  A subsequent lateral portal was established through the subacromial space and the suture was retrieved out  through this portal.  Using this lateral portal, an additional #2 nonabsorbable tape was passed in a [place] and repeated fashion through the biceps tendon 3 times.  This was then brought back to the anterior portal and saved for passage into the lateral row.   Through visualization from intra-articular, the footprint of the rotator cuff was debrided of soft tissues back to bleeding bone.  This was done with an arthroscopic shaver.     The rotator cuff was then approached through the subacromial space. Anterior, lateral, and posterior portals were used.  Bursectomy was performed with an arthroscopic shaver and [employer] wand.  The soft tissues on the undersurface of the acromion and clavicle were resected with the arthroscopic shaver and wand. There was good excursion that was noted of the tendon back to its footprint which would be amendable to an repair.   The rotator cuff was repaired with a double row configuration with two medial row all suture suture anchors as noted above with sutures passed from anterior to posterior in horizontal fashion with a self passing suture device.  A total of 6 limbs of suture were passed.  The sutures were placed into 2 lateral row anchor.  This provided excellent anatomic footprint approximation.  The limbs of the biceps suture were tensioned into the anterior lateral anchor.  Given the quality of the tendon and the chronicity of the tear the decision was made to augment this with a Cuffmend patch.  This patch was introduced into the lateral portal.  The medial aspect of this was secured using 2 all inside suture devices.  These were tensioned and provided good approximation over to the musculotendinous junction.  The lateral row was then approximated onto the tuberosity and these were placed with a self punching push locks.  Overall this provided excellent coverage over the musculotendinous junction over the supraspinatus.   The shoulder was irrigated.  The arthroscopic  instruments were removed.  Wounds were closed with 3-0 nylon sutures.  A sterile dressing was applied with xeroform, 4x8s, abdominal pad, and tape. An Gar Gibbon was placed and the upper extremity was placed in a shoulder immobilizer.  The patient tolerated the procedure well and was taken to the recovery room in stable condition.  All counts were correct in the case. The patient tolerated the procedure well and was taken to the recovery room in stable condition.    POSTOPERATIVE PLAN: She will be nonweightbearing in a sling.  She will be seen by physical therapy postoperatively.  I will see her back in 2 weeks for suture removal.  She was placed on aspirin for blood clot prevention  Yevonne Pax, MD 6:36 PM

## 2021-11-22 NOTE — Discharge Instructions (Signed)
     Discharge Instructions    Attending Surgeon: Vanetta Mulders, MD Office Phone Number: [phone]   Diagnosis and Procedures:    Surgeries Performed: Left shoulder rotator cuff repair  Discharge Plan:    Diet: Resume usual diet. Begin with light or bland foods.  Drink plenty of fluids.  Activity:  Keep sling and dressing in place until your follow up visit in Physical Therapy You are advised to go home directly from the hospital or surgical center. Restrict your activities.  GENERAL INSTRUCTIONS: 1.  Keep your surgical site elevated above your heart for at least 5-7 days or longer to prevent swelling. This will improve your comfort and your overall recovery following surgery.     2. Please call Dr. Eddie Dibbles office at [phone] with questions Monday-Friday during business hours. If no one answers, please leave a message and someone should get back to the patient within 24 hours. For emergencies please call 911 or proceed to the emergency room.   3. Patient to notify surgical team if experiences any of the following: Bowel/Bladder dysfunction, uncontrolled pain, nerve/muscle weakness, incision with increased drainage or redness, nausea/vomiting and Fever greater than 101.0 F.  Be alert for signs of infection including redness, streaking, odor, fever or chills. Be alert for excessive pain or bleeding and notify your surgeon immediately.  WOUND INSTRUCTIONS:   Leave your dressing/cast/splint in place until your post operative visit.  Keep it clean and dry.  Always keep the incision clean and dry until the staples/sutures are removed. If there is no drainage from the incision you should keep it open to air. If there is drainage from the incision you must keep it covered at all times until the drainage stops  Do not soak in a bath tub, hot tub, pool, lake or other body of water until 21 days after your surgery and your incision is completely dry and healed.  If you have  removable sutures (or staples) they must be removed 10-14 days (unless otherwise instructed) from the day of your surgery.     1)  Elevate the extremity as much as possible.  2)  Keep the dressing clean and dry.  3)  Please call us if the dressing becomes wet or dirty.  4)  If you are experiencing worsening pain or worsening swelling, please call.     MEDICATIONS: Resume all previous home medications at the previous prescribed dose and frequency unless otherwise noted Start taking the  pain medications on an as-needed basis as prescribed  Please taper down pain medication over the next week following surgery.  Ideally you should not require a refill of any narcotic pain medication.  Take pain medication with food to minimize nausea. In addition to the prescribed pain medication, you may take over-the-counter pain relievers such as Tylenol.  Do NOT take additional tylenol if your pain medication already has tylenol in it.  Aspirin '325mg'$  daily for four weeks.      FOLLOWUP INSTRUCTIONS: 1. Follow up at the Physical Therapy Clinic 3-4 days following surgery. This appointment should be scheduled unless other arrangements have been made.The Physical Therapy scheduling number is [phone] if an appointment has not already been arranged.  2. Contact Dr. Eddie Dibbles office during office hours at [phone] or the practice after hours line at [phone] for non-emergencies. For medical emergencies call 911.   Discharge Location: Home

## 2021-11-22 NOTE — Anesthesia Preprocedure Evaluation (Signed)
Anesthesia Evaluation  Patient identified by MRN, date of birth, ID band Patient awake    Reviewed: Allergy & Precautions, NPO status , Patient's Chart, lab work & pertinent test results  Airway Mallampati: II  TM Distance: >3 FB Neck ROM: Full    Dental no notable dental hx.    Pulmonary neg pulmonary ROS   Pulmonary exam normal        Cardiovascular + CAD   Rhythm:Regular Rate:Normal     Neuro/Psych  Headaches  Anxiety        GI/Hepatic Neg liver ROS,GERD  Medicated,,  Endo/Other  negative endocrine ROS    Renal/GU negative Renal ROS  negative genitourinary   Musculoskeletal  (+) Arthritis , Osteoarthritis,    Abdominal Normal abdominal exam  (+)   Peds  Hematology  (+) Blood dyscrasia, anemia Lab Results      Component                Value               Date                      WBC                      6.9                 11/16/2021                HGB                      13.4                11/16/2021                HCT                      40.3                11/16/2021                MCV                      94.4                11/16/2021                PLT                      244                 11/16/2021             Lab Results      Component                Value               Date                      NA                       141                 06/16/2020                K  4.8                 06/16/2020                CO2                      23                  06/16/2020                GLUCOSE                  81                  06/16/2020                BUN                      9                   06/16/2020                CREATININE               0.73                06/16/2020                CALCIUM                  9.4                 06/16/2020                EGFR                     94                  06/16/2020                GFRNONAA                 >60                  04/09/2009              Anesthesia Other Findings   Reproductive/Obstetrics                              Anesthesia Physical Anesthesia Plan  ASA: 2  Anesthesia Plan: General and Regional   Post-op Pain Management: Regional block*   Induction: Intravenous  PONV Risk Score and Plan: 3 and Ondansetron, Dexamethasone, Midazolam and Treatment may vary due to age or medical condition  Airway Management Planned: Mask and Oral ETT  Additional Equipment: None  Intra-op Plan:   Post-operative Plan: Extubation in OR  Informed Consent: I have reviewed the patients History and Physical, chart, labs and discussed the procedure including the risks, benefits and alternatives for the proposed anesthesia with the patient or authorized representative who has indicated his/her understanding and acceptance.     Dental advisory given  Plan Discussed with: CRNA  Anesthesia Plan Comments:          Anesthesia Quick Evaluation

## 2021-11-22 NOTE — Interval H&P Note (Signed)
History and Physical Interval Note:  11/22/2021 1:51 PM  Grace Taylor  has presented today for surgery, with the diagnosis of LEFT ROTATOR CUFF TEAR.  The various methods of treatment have been discussed with the patient and family. After consideration of risks, benefits and other options for treatment, the patient has consented to  Procedure(s): LEFT SHOULDER ARTHROSCOPY WITH ROTATOR CUFF REPAIR AND BICEPS TENODESIS (Left) as a surgical intervention.  The patient's history has been reviewed, patient examined, no change in status, stable for surgery.  I have reviewed the patient's chart and labs.  Questions were answered to the patient's satisfaction.     Vanetta Mulders

## 2021-11-22 NOTE — Brief Op Note (Signed)
   Brief Op Note  Date of Surgery: 11/22/2021  Preoperative Diagnosis: LEFT ROTATOR CUFF TEAR  Postoperative Diagnosis: same  Procedure: Procedure(s): LEFT SHOULDER ARTHROSCOPY WITH ROTATOR CUFF REPAIR AND BICEPS TENODESIS  Implants: Implant Name Type Inv. Item Serial No. Manufacturer Lot No. LRB No. Used Action  ANCHOR SUT BIO SW 4.75X19.1 - [id number] Anchor ANCHOR SUT BIO SW 4.75X19.1  [employer] [id number] Left 2 Implanted  ANCHOR SUT FBRTK 2.6 SP #5 - [id number] Anchor ANCHOR SUT FBRTK 2.6 SP #5  [employer] [id number] Left 1 Implanted  TISSUE ARTHROFLEX THICK 4 - [id number] Tissue TISSUE ARTHROFLEX THICK 4 [id number] [facility]  Left 1 Implanted  ANCHOR FIBERTAK 2.6X1.7 BLUE - [id number] Anchor ANCHOR FIBERTAK 2.6X1.7 BLUE  [employer] [id number] Left 1 Implanted  FiberStitch Implant Curved    [employer] [id number] Left 2 Implanted  ANCHOR PUSHLOCK PEEK 3.5X19.5 - [id number] Anchor ANCHOR PUSHLOCK PEEK 3.5X19.5  [employer] [id number] Left 1 Implanted  ANCHOR PUSHLOCK PEEK 3.5X19.5 - [id number] Anchor ANCHOR PUSHLOCK PEEK 3.5X19.5  [employer] [id number] Left 1 Implanted    Surgeons: Surgeon(s): Vanetta Mulders, MD  Anesthesia: Regional    Estimated Blood Loss: See anesthesia record  Complications: None  Condition to PACU: Stable  Yevonne Pax, MD 11/22/2021 4:29 PM

## 2021-11-22 NOTE — Anesthesia Procedure Notes (Signed)
Procedure Name: Intubation Date/Time: 11/22/2021 2:09 PM  Performed by: Lorie Phenix, CRNAPre-anesthesia Checklist: Patient identified, Emergency Drugs available, Suction available and Patient being monitored Patient Re-evaluated:Patient Re-evaluated prior to induction Oxygen Delivery Method: Circle system utilized Preoxygenation: Pre-oxygenation with 100% oxygen Induction Type: IV induction Ventilation: Mask ventilation without difficulty Laryngoscope Size: Mac and 3 Grade View: Grade I Tube type: Oral Tube size: 7.5 mm Number of attempts: 1 Airway Equipment and Method: Stylet Placement Confirmation: ETT inserted through vocal cords under direct vision, positive ETCO2 and breath sounds checked- equal and bilateral Secured at: 22 cm Tube secured with: Tape Dental Injury: Teeth and Oropharynx as per pre-operative assessment

## 2021-11-22 NOTE — Anesthesia Procedure Notes (Signed)
Anesthesia Regional Block: Interscalene brachial plexus block   Pre-Anesthetic Checklist: , timeout performed,  Correct Patient, Correct Site, Correct Laterality,  Correct Procedure, Correct Position, site marked,  Risks and benefits discussed,  Surgical consent,  Pre-op evaluation,  At surgeon's request and post-op pain management  Laterality: Left  Prep: Dura Prep       Needles:  Injection technique: Single-shot  Needle Type: Echogenic Stimulator Needle     Needle Length: 5cm  Needle Gauge: 20     Additional Needles:   Procedures:,,,, ultrasound used (permanent image in chart),,    Narrative:  Start time: 11/22/2021 1:13 PM End time: 11/22/2021 1:17 PM Injection made incrementally with aspirations every 5 mL.  Performed by: Personally  Anesthesiologist: Darral Dash, DO  Additional Notes: Patient identified. Risks/Benefits/Options discussed with patient including but not limited to bleeding, infection, nerve damage, failed block, incomplete pain control. Patient expressed understanding and wished to proceed. All questions were answered. Sterile technique was used throughout the entire procedure. Please see nursing notes for vital signs. Aspirated in 5cc intervals with injection for negative confirmation. Patient was given instructions on fall risk and not to get out of bed. All questions and concerns addressed with instructions to call with any issues or inadequate analgesia.

## 2021-11-22 NOTE — Transfer of Care (Signed)
Immediate Anesthesia Transfer of Care Note  Patient: Grace Taylor  Procedure(s) Performed: LEFT SHOULDER ARTHROSCOPY WITH ROTATOR CUFF REPAIR AND BICEPS TENODESIS (Left: Shoulder)  Patient Location: PACU  Anesthesia Type:General and Regional  Level of Consciousness: drowsy  Airway & Oxygen Therapy: Patient Spontanous Breathing and Patient connected to nasal cannula oxygen  Post-op Assessment: Report given to RN and Post -op Vital signs reviewed and stable  Post vital signs: Reviewed and stable  Last Vitals:  Vitals Value Taken Time  BP 122/56 11/22/21 1633  Temp    Pulse 78 11/22/21 1637  Resp 16 11/22/21 1637  SpO2 97 % 11/22/21 1637  Vitals shown include unvalidated device data.  Last Pain:  Vitals:   11/22/21 1325  TempSrc:   PainSc: 0-No pain      Patients Stated Pain Goal: 0 (50/72/25 7505)  Complications: No notable events documented.

## 2021-11-22 NOTE — Anesthesia Postprocedure Evaluation (Signed)
Anesthesia Post Note  Patient: Grace Taylor  Procedure(s) Performed: LEFT SHOULDER ARTHROSCOPY WITH ROTATOR CUFF REPAIR AND BICEPS TENODESIS (Left: Shoulder)     Patient location during evaluation: PACU Anesthesia Type: Regional and General Level of consciousness: awake and alert, oriented and patient cooperative Pain management: pain level controlled Vital Signs Assessment: post-procedure vital signs reviewed and stable Respiratory status: spontaneous breathing, nonlabored ventilation and respiratory function stable Cardiovascular status: blood pressure returned to baseline and stable Postop Assessment: no apparent nausea or vomiting Anesthetic complications: no   No notable events documented.  Last Vitals:  Vitals:   11/22/21 1700 11/22/21 1715  BP: 124/68 120/68  Pulse: 85 76  Resp: 19 18  Temp:    SpO2: 95% 98%    Last Pain:  Vitals:   11/22/21 1700  TempSrc:   PainSc: 0-No pain                 Pervis Hocking

## 2021-11-23 ENCOUNTER — Encounter (HOSPITAL_COMMUNITY): Payer: Self-pay | Admitting: Orthopaedic Surgery

## 2021-11-24 ENCOUNTER — Telehealth (HOSPITAL_BASED_OUTPATIENT_CLINIC_OR_DEPARTMENT_OTHER): Payer: Self-pay | Admitting: Physical Therapy

## 2021-11-24 ENCOUNTER — Ambulatory Visit (HOSPITAL_BASED_OUTPATIENT_CLINIC_OR_DEPARTMENT_OTHER): Payer: Medicare HMO | Attending: Orthopaedic Surgery | Admitting: Physical Therapy

## 2021-11-24 ENCOUNTER — Telehealth: Payer: Self-pay

## 2021-11-24 DIAGNOSIS — R69 Illness, unspecified: Secondary | ICD-10-CM | POA: Diagnosis not present

## 2021-11-24 DIAGNOSIS — S46012D Strain of muscle(s) and tendon(s) of the rotator cuff of left shoulder, subsequent encounter: Secondary | ICD-10-CM | POA: Diagnosis not present

## 2021-11-24 DIAGNOSIS — Z7951 Long term (current) use of inhaled steroids: Secondary | ICD-10-CM | POA: Diagnosis not present

## 2021-11-24 DIAGNOSIS — E785 Hyperlipidemia, unspecified: Secondary | ICD-10-CM | POA: Diagnosis not present

## 2021-11-24 DIAGNOSIS — S46012A Strain of muscle(s) and tendon(s) of the rotator cuff of left shoulder, initial encounter: Secondary | ICD-10-CM | POA: Diagnosis not present

## 2021-11-24 DIAGNOSIS — Z7982 Long term (current) use of aspirin: Secondary | ICD-10-CM | POA: Diagnosis not present

## 2021-11-24 DIAGNOSIS — M4722 Other spondylosis with radiculopathy, cervical region: Secondary | ICD-10-CM | POA: Diagnosis not present

## 2021-11-24 DIAGNOSIS — Z9181 History of falling: Secondary | ICD-10-CM | POA: Diagnosis not present

## 2021-11-24 DIAGNOSIS — D649 Anemia, unspecified: Secondary | ICD-10-CM | POA: Diagnosis not present

## 2021-11-24 DIAGNOSIS — Z635 Disruption of family by separation and divorce: Secondary | ICD-10-CM | POA: Diagnosis not present

## 2021-11-24 DIAGNOSIS — M48061 Spinal stenosis, lumbar region without neurogenic claudication: Secondary | ICD-10-CM | POA: Diagnosis not present

## 2021-11-24 DIAGNOSIS — Z79891 Long term (current) use of opiate analgesic: Secondary | ICD-10-CM | POA: Diagnosis not present

## 2021-11-24 DIAGNOSIS — I493 Ventricular premature depolarization: Secondary | ICD-10-CM | POA: Diagnosis not present

## 2021-11-24 DIAGNOSIS — M19071 Primary osteoarthritis, right ankle and foot: Secondary | ICD-10-CM | POA: Diagnosis not present

## 2021-11-24 NOTE — Telephone Encounter (Signed)
RC to L[facility] and faxed and emailed Dr. Eddie Dibbles RCR protocol to [phone] and emailed to [email or web address].  Informed her she may perform dressing changes and the dressing may be removed at this time and replaced with bandaids

## 2021-11-24 NOTE — Telephone Encounter (Signed)
[facility] with [facility] called and wants to know if the pt has any ROM restrictions s/p a shoulder scope and RTC repair 11/22/21. She also wants to know if she should remove the dressing and if so what should be applied afterwards. CB#[phone]

## 2021-11-24 NOTE — Telephone Encounter (Signed)
LVM regarding NS for PT appt today. Requested she call us back.  Dutch Ing C. Gary Gabrielsen PT, DPT 11/24/21 9:13 AM

## 2021-11-29 DIAGNOSIS — Z79891 Long term (current) use of opiate analgesic: Secondary | ICD-10-CM | POA: Diagnosis not present

## 2021-11-29 DIAGNOSIS — M19071 Primary osteoarthritis, right ankle and foot: Secondary | ICD-10-CM | POA: Diagnosis not present

## 2021-11-29 DIAGNOSIS — M48061 Spinal stenosis, lumbar region without neurogenic claudication: Secondary | ICD-10-CM | POA: Diagnosis not present

## 2021-11-29 DIAGNOSIS — M4722 Other spondylosis with radiculopathy, cervical region: Secondary | ICD-10-CM | POA: Diagnosis not present

## 2021-11-29 DIAGNOSIS — Z7982 Long term (current) use of aspirin: Secondary | ICD-10-CM | POA: Diagnosis not present

## 2021-11-29 DIAGNOSIS — D649 Anemia, unspecified: Secondary | ICD-10-CM | POA: Diagnosis not present

## 2021-11-29 DIAGNOSIS — R69 Illness, unspecified: Secondary | ICD-10-CM | POA: Diagnosis not present

## 2021-11-29 DIAGNOSIS — Z9181 History of falling: Secondary | ICD-10-CM | POA: Diagnosis not present

## 2021-11-29 DIAGNOSIS — S46012D Strain of muscle(s) and tendon(s) of the rotator cuff of left shoulder, subsequent encounter: Secondary | ICD-10-CM | POA: Diagnosis not present

## 2021-11-29 DIAGNOSIS — E785 Hyperlipidemia, unspecified: Secondary | ICD-10-CM | POA: Diagnosis not present

## 2021-11-29 DIAGNOSIS — Z635 Disruption of family by separation and divorce: Secondary | ICD-10-CM | POA: Diagnosis not present

## 2021-11-29 DIAGNOSIS — Z7951 Long term (current) use of inhaled steroids: Secondary | ICD-10-CM | POA: Diagnosis not present

## 2021-11-29 DIAGNOSIS — I493 Ventricular premature depolarization: Secondary | ICD-10-CM | POA: Diagnosis not present

## 2021-11-30 ENCOUNTER — Encounter (HOSPITAL_BASED_OUTPATIENT_CLINIC_OR_DEPARTMENT_OTHER): Payer: Self-pay | Admitting: Physical Therapy

## 2021-12-02 ENCOUNTER — Telehealth: Payer: Self-pay | Admitting: Orthopaedic Surgery

## 2021-12-02 ENCOUNTER — Other Ambulatory Visit (HOSPITAL_BASED_OUTPATIENT_CLINIC_OR_DEPARTMENT_OTHER): Payer: Self-pay | Admitting: Orthopaedic Surgery

## 2021-12-02 ENCOUNTER — Encounter (HOSPITAL_BASED_OUTPATIENT_CLINIC_OR_DEPARTMENT_OTHER): Payer: Self-pay | Admitting: Physical Therapy

## 2021-12-02 DIAGNOSIS — Z9181 History of falling: Secondary | ICD-10-CM | POA: Diagnosis not present

## 2021-12-02 DIAGNOSIS — Z79891 Long term (current) use of opiate analgesic: Secondary | ICD-10-CM | POA: Diagnosis not present

## 2021-12-02 DIAGNOSIS — R69 Illness, unspecified: Secondary | ICD-10-CM | POA: Diagnosis not present

## 2021-12-02 DIAGNOSIS — M48061 Spinal stenosis, lumbar region without neurogenic claudication: Secondary | ICD-10-CM | POA: Diagnosis not present

## 2021-12-02 DIAGNOSIS — D649 Anemia, unspecified: Secondary | ICD-10-CM | POA: Diagnosis not present

## 2021-12-02 DIAGNOSIS — E785 Hyperlipidemia, unspecified: Secondary | ICD-10-CM | POA: Diagnosis not present

## 2021-12-02 DIAGNOSIS — S46012D Strain of muscle(s) and tendon(s) of the rotator cuff of left shoulder, subsequent encounter: Secondary | ICD-10-CM | POA: Diagnosis not present

## 2021-12-02 DIAGNOSIS — M4722 Other spondylosis with radiculopathy, cervical region: Secondary | ICD-10-CM | POA: Diagnosis not present

## 2021-12-02 DIAGNOSIS — I493 Ventricular premature depolarization: Secondary | ICD-10-CM | POA: Diagnosis not present

## 2021-12-02 DIAGNOSIS — Z7982 Long term (current) use of aspirin: Secondary | ICD-10-CM | POA: Diagnosis not present

## 2021-12-02 DIAGNOSIS — M19071 Primary osteoarthritis, right ankle and foot: Secondary | ICD-10-CM | POA: Diagnosis not present

## 2021-12-02 DIAGNOSIS — Z635 Disruption of family by separation and divorce: Secondary | ICD-10-CM | POA: Diagnosis not present

## 2021-12-02 DIAGNOSIS — Z7951 Long term (current) use of inhaled steroids: Secondary | ICD-10-CM | POA: Diagnosis not present

## 2021-12-02 NOTE — Telephone Encounter (Signed)
[place] From [facility] called in stating that they need clarification for physical therapy for pt... Liji requesting callback at [phone]

## 2021-12-06 ENCOUNTER — Encounter (HOSPITAL_BASED_OUTPATIENT_CLINIC_OR_DEPARTMENT_OTHER): Payer: Self-pay | Admitting: Physical Therapy

## 2021-12-06 ENCOUNTER — Ambulatory Visit (INDEPENDENT_AMBULATORY_CARE_PROVIDER_SITE_OTHER): Payer: Medicare HMO | Admitting: Orthopaedic Surgery

## 2021-12-06 DIAGNOSIS — S46012A Strain of muscle(s) and tendon(s) of the rotator cuff of left shoulder, initial encounter: Secondary | ICD-10-CM

## 2021-12-06 NOTE — Progress Notes (Signed)
Post Operative Evaluation    Procedure/Date of Surgery: Left shoulder rotator cuff repair with collagen patch augmentation and biceps tenodesis 11/20  Interval History:     Presents today 2 weeks status post the above procedure.  Overall she is doing very well.  She has begun to work with physical therapy.  She is working on range of motion.  Denies any significant pain.  She is compliant with aspirin.   PMH/PSH/Family History/Social History/Meds/Allergies:    Past Medical History:  Diagnosis Date   Allergy    Anemia    Benign colon polyp 04/07/2017   Q 5 year recall   Chronic neck pain - post injury, worker's comp 2013, 2018 04/07/2017   Dr. Mardelle Matte   Clostridium difficile infection    History of peptic ulcer 04/07/2017   Hyperlipidemia    IBS (irritable bowel syndrome) 04/07/2017   Colonoscopy by Carlean Purl   Migraines    PTSD (post-traumatic stress disorder) 04/07/2017   Ulcer    age 61   Past Surgical History:  Procedure Laterality Date   CHOLECYSTECTOMY  2012   COLONOSCOPY  2014   DG GALL BLADDER     2012   ESOPHAGOGASTRODUODENOSCOPY  2014   FLEXIBLE SIGMOIDOSCOPY  03/2014   OVARIAN CYST REMOVAL     right shoulder surgery     SHOULDER ARTHROSCOPY WITH ROTATOR CUFF REPAIR AND OPEN BICEPS TENODESIS Left 11/22/2021   Procedure: LEFT SHOULDER ARTHROSCOPY WITH ROTATOR CUFF REPAIR AND BICEPS TENODESIS;  Surgeon: Vanetta Mulders, MD;  Location: [place];  Service: Orthopedics;  Laterality: Left;   TMJ ARTHROSCOPY     Social History   Socioeconomic History   Marital status: Divorced    Spouse name: Not on file   Number of children: 0   Years of education: some college   Highest education level: Not on file  Occupational History   Occupation: former Control and instrumentation engineer   Occupation: disabled  Tobacco Use   Smoking status: Never   Smokeless tobacco: Never  Vaping Use   Vaping Use: Never used  Substance and Sexual Activity   Alcohol use:  Not Currently   Drug use: No   Sexual activity: Yes    Birth control/protection: Condom  Other Topics Concern   Not on file  Social History Narrative   Worked at the [employer], on SSD or SSI - but no health insurance   Social Determinants of Health   Financial Resource Strain: Medium Risk (04/07/2017)   Overall Financial Resource Strain (CARDIA)    Difficulty of Paying Living Expenses: Somewhat hard  Food Insecurity: Food Insecurity Present (04/07/2017)   Hunger Vital Sign    Worried About Running Out of Food in the Last Year: Sometimes true    Ran Out of Food in the Last Year: Not on file  Transportation Needs: Not on file  Physical Activity: Not on file  Stress: Stress Concern Present (04/07/2017)   [place]    Feeling of Stress : Very much  Social Connections: Not on file   Family History  Problem Relation Age of Onset   Hypertension Mother    Alcohol abuse Mother    Arthritis Mother    Cancer Mother    Heart disease Mother    Kidney disease Mother  Mental illness Mother    Hypertension Father    CAD Father 78       CABG    Hyperlipidemia Father    Heart disease Father    Early death Father    Heart attack Father    Hypertension Brother    Alcohol abuse Brother    Heart disease Brother    Breast cancer Maternal Aunt    Cancer Maternal Aunt        breast cancer   Hypertension Sister    Hyperlipidemia Sister    Arthritis Sister    Allergies  Allergen Reactions   Claritin [Loratadine]     Affects eyes    Codeine     Hyperactivity    Crestor [Rosuvastatin]     INEFFECTIVE AT LOWERING LDL   Lipitor [Atorvastatin]     INEFFECTIVE AT LOWERING LDL   Penicillins Hives   Simvastatin     ELEVATED LIVER ENZYMES   Sulfonamide Derivatives Nausea And Vomiting and Rash   Current Outpatient Medications  Medication Sig Dispense Refill   Alirocumab (PRALUENT) 150 MG/ML SOAJ INJECT 150 MG INTO THE  SKIN EVERY 14 DAYS. 6 mL 0   Ascorbic Acid (VITAMIN C) 1000 MG tablet Take 1,000 mg by mouth daily.     aspirin EC 325 MG tablet Take 1 tablet (325 mg total) by mouth daily. 30 tablet 0   Calcium-Magnesium-Zinc (CAL-MAG-ZINC PO) Take 1 tablet by mouth daily.     Carboxymethylcellulose Sodium (THERATEARS OP) Place 1 drop into both eyes daily as needed (irritation).     clonazePAM (KLONOPIN) 0.5 MG tablet Take 0.5-1 tablets by mouth See admin instructions. Take 0.5 mg in the morning and 1 mg in the evening  1   ezetimibe (ZETIA) 10 MG tablet Take 1 tablet (10 mg total) by mouth daily. 90 tablet 3   famotidine (PEPCID) 40 MG tablet Take 1 tablet (40 mg total) by mouth every evening. (Patient taking differently: Take 40 mg by mouth daily as needed for heartburn.) 90 tablet 1   Ferrous Sulfate (IRON PO) Take 25 mg by mouth daily.     fluticasone (FLONASE) 50 MCG/ACT nasal spray Place 2 sprays into both nostrils daily as needed for allergies or rhinitis.     ibuprofen (ADVIL) 800 MG tablet TAKE 1 TABLET EVERY 8 HOURS FOR 10 DAYS. PLEASE TAKE WITH FOOD, PLEASE ALTERNATE WITH ACETAMINOPHEN 30 tablet 0   magnesium gluconate (MAGONATE) 500 MG tablet Take 500 mg by mouth 2 (two) times daily.     METAMUCIL FIBER PO Take 4 capsules by mouth 2 (two) times daily.     Multiple Vitamins-Minerals (ADULT GUMMY PO) Take 2 capsules by mouth daily.     Multiple Vitamins-Minerals (HAIR SKIN NAILS) CAPS Take 1 tablet by mouth daily.     niacin (VITAMIN B3) 500 MG tablet Take 500 mg by mouth daily.     Omega-3 1000 MG CAPS Take 2,000 mg by mouth daily.     omeprazole (PRILOSEC) 40 MG capsule TAKE 1 CAPSULE BY MOUTH IN THE MORNING AND AT BEDTIME. 60 capsule 5   oxycodone (OXY-IR) 5 MG capsule Take 1 capsule (5 mg total) by mouth every 4 (four) hours as needed (severe pain). 20 capsule 0   Polyethyl Glycol-Propyl Glycol (SYSTANE OP) Place 1 drop into both eyes daily as needed (irritation).     Probiotic Product (PROBIOTIC  PO) Take 1 capsule by mouth daily.     vitamin B-12 (CYANOCOBALAMIN) 500 MCG tablet Take 500  mcg by mouth daily.     zolpidem (AMBIEN) 10 MG tablet Take 10 mg by mouth at bedtime.      No current facility-administered medications for this visit.   No results found.  Review of Systems:   A ROS was performed including pertinent positives and negatives as documented in the HPI.   Musculoskeletal Exam:    There were no vitals taken for this visit.  Left shoulder incisions are well-appearing without erythema or drainage.  Passive forward elevation in supine position is 90 degrees.  External rotation at the side is to 45 degrees.  Internal rotation is deferred today.  Distal neurosensory exam is intact 2+ radial pulse  Imaging:      I personally reviewed and interpreted the radiographs.   Assessment:   2 weeks status post left shoulder rotator cuff repair with collagen patch augmentation and biceps tenodesis.  Overall she is doing extremely well.  She will continue to progress according to rehab protocol.  I will plan to see her back in 4 weeks.  All limitations restrictions were discussed  Plan :    -Return to clinic in 4 weeks for reassessment      I personally saw and evaluated the patient, and participated in the management and treatment plan.  Vanetta Mulders, MD Attending Physician, Orthopedic Surgery  This document was dictated using Dragon voice recognition software. A reasonable attempt at proof reading has been made to minimize errors.

## 2021-12-09 ENCOUNTER — Telehealth: Payer: Self-pay | Admitting: Orthopaedic Surgery

## 2021-12-09 ENCOUNTER — Encounter: Payer: Self-pay | Admitting: *Deleted

## 2021-12-09 NOTE — Telephone Encounter (Signed)
[place] From [facility] called in stating that they are not able to continue services with pt... [place] stated that pt significant other will not allow pt to speak with home health on the phone... [place] stated that pt significant other gets very hostile on the phone... [place] stated that there office will not send anyone to pt home in that type of environment.... Pt is not following surgical precautions... Pt significant other is telling pt to take sling off and perform active exercises with her shoulder... [facility] stated if Dr. Sammuel Hines have any question he can reach her at [phone]

## 2021-12-10 ENCOUNTER — Ambulatory Visit (HOSPITAL_BASED_OUTPATIENT_CLINIC_OR_DEPARTMENT_OTHER): Payer: Medicare HMO | Attending: Orthopaedic Surgery | Admitting: Physical Therapy

## 2021-12-10 ENCOUNTER — Encounter (HOSPITAL_BASED_OUTPATIENT_CLINIC_OR_DEPARTMENT_OTHER): Payer: Self-pay | Admitting: Physical Therapy

## 2021-12-10 ENCOUNTER — Other Ambulatory Visit: Payer: Self-pay

## 2021-12-10 DIAGNOSIS — M25512 Pain in left shoulder: Secondary | ICD-10-CM | POA: Diagnosis not present

## 2021-12-10 DIAGNOSIS — S46012A Strain of muscle(s) and tendon(s) of the rotator cuff of left shoulder, initial encounter: Secondary | ICD-10-CM | POA: Insufficient documentation

## 2021-12-10 DIAGNOSIS — M25612 Stiffness of left shoulder, not elsewhere classified: Secondary | ICD-10-CM | POA: Insufficient documentation

## 2021-12-10 NOTE — Therapy (Unsigned)
OUTPATIENT PHYSICAL THERAPY SHOULDER EVALUATION   Patient Name: Grace Taylor MRN: [id number] DOB:July 17, 1960, 61 y.o., adult Today's Date: 12/11/2021  END OF SESSION:  PT End of Session - 12/10/21 1402     Visit Number 1    Number of Visits 25    Date for PT Re-Evaluation 03/04/22    Authorization Type [employer]    PT Start Time 1400    PT Stop Time 1224    PT Time Calculation (min) 38 min    Activity Tolerance Patient tolerated treatment well    Behavior During Therapy [facility] for tasks assessed/performed             Past Medical History:  Diagnosis Date   Allergy    Anemia    Benign colon polyp 04/07/2017   Q 5 year recall   Chronic neck pain - post injury, worker's comp 2013, 2018 04/07/2017   Dr. Mardelle Matte   Clostridium difficile infection    History of peptic ulcer 04/07/2017   Hyperlipidemia    IBS (irritable bowel syndrome) 04/07/2017   Colonoscopy by Carlean Purl   Migraines    PTSD (post-traumatic stress disorder) 04/07/2017   Ulcer    age 61   Past Surgical History:  Procedure Laterality Date   CHOLECYSTECTOMY  2012   COLONOSCOPY  2014   DG GALL BLADDER     2012   ESOPHAGOGASTRODUODENOSCOPY  2014   FLEXIBLE SIGMOIDOSCOPY  03/2014   OVARIAN CYST REMOVAL     right shoulder surgery     SHOULDER ARTHROSCOPY WITH ROTATOR CUFF REPAIR AND OPEN BICEPS TENODESIS Left 11/22/2021   Procedure: LEFT SHOULDER ARTHROSCOPY WITH ROTATOR CUFF REPAIR AND BICEPS TENODESIS;  Surgeon: Vanetta Mulders, MD;  Location: [place];  Service: Orthopedics;  Laterality: Left;   TMJ ARTHROSCOPY     Patient Active Problem List   Diagnosis Date Noted   Traumatic complete tear of left rotator cuff 11/22/2021   Other spondylosis with radiculopathy, cervical region 04/21/2021   Foraminal stenosis of cervical region 04/21/2021   Myalgia due to statin 07/21/2020   Leg swelling 06/10/2020   Elevated coronary artery calcium score 06/10/2020   Candida laryngitis 10/22/2019   Hoarseness  10/22/2019   Laryngospasms 10/22/2019   Muscle tension dysphonia 10/22/2019   Globus sensation 10/22/2019   Plantar fasciitis, right 07/05/2019   Lumbar spinal stenosis 07/05/2019   GAD (generalized anxiety disorder) 06/20/2019   Educated about COVID-19 virus infection 06/09/2019   Dyslalia 06/09/2019   Ptosis of left eyelid 06/04/2019   Left-sided Bell's palsy 04/04/2019   Closed nondisplaced fracture of distal phalanx of right ring finger 04/03/2019   Abnormal liver function tests 12/14/2018   Lumbosacral disc herniation 12/14/2018   Cervical spinal stenosis 12/14/2018   Nontraumatic incomplete tear of left rotator cuff 06/01/2018   Biceps tendinitis of left shoulder 03/01/2018   Osteoarthritis of right subtalar joint 03/01/2018   PTSD (post-traumatic stress disorder) 04/07/2017   Irritable bowel syndrome with diarrhea 04/07/2017   Benign colon polyp 04/07/2017   History of Clostridium difficile colitis 04/07/2017   History of peptic ulcer 04/07/2017   Chronic neck pain - post injury, worker's comp 2013, 2018 04/07/2017   PVC's (premature ventricular contractions) 10/11/2016   Dyslipidemia 10/11/2016    REFERRING PROVIDER: Sammuel Hines, MD  REFERRING DIAG:  S46.012A (ICD-10-CM) - Traumatic complete tear of left rotator cuff, initial encounter  S/p RCR, SAD biceps tenodesis on 11/22/21    THERAPY DIAG:  Acute pain of left shoulder  Stiffness of left shoulder,  not elsewhere classified  Rationale for Evaluation and Treatment: Rehabilitation  ONSET DATE: DOS 11/22/21  SUBJECTIVE:                                                                                                                                                                                      SUBJECTIVE STATEMENT: Pt reports arm isdoing well overall. Originally I was lifting by arm but it hurt. 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 I still need to get Rt  shoulder done.   PERTINENT HISTORY: See problem list  PAIN:  Are you having pain? No  PRECAUTIONS: Shoulder  WEIGHT BEARING RESTRICTIONS: No  FALLS:  Has patient fallen in last 6 months? No  OCCUPATION: Not working  PLOF: Independent  PATIENT GOALS:be able to function normally, lift something up, rebuild upper body strength   OBJECTIVE:   PATIENT SURVEYS:  FOTO 40  POSTURE: Shoulder forward rounded as expected with sling  UPPER EXTREMITY ROM:   Passive ROM LEFT eval   Shoulder flexion 120   Shoulder extension    Shoulder abduction    Shoulder adduction    Shoulder internal rotation    Shoulder external rotation    (Blank rows = not tested)  UPPER EXTREMITY MMT:  MMT LEFT eval   Shoulder flexion    Shoulder extension    Shoulder abduction    Shoulder adduction    Shoulder internal rotation    Shoulder external rotation    Middle trapezius    Lower trapezius    Elbow flexion    Elbow extension    Wrist flexion    Wrist extension    Wrist ulnar deviation    Wrist radial deviation    Wrist pronation    Wrist supination    Grip strength (lbs)    (Blank rows = not tested)   JOINT MOBILITY TESTING:  Limited AP mobility  PALPATION:  TTP grossly around anterior shoulder and along biceps   TODAY'S TREATMENT:  Treatment                            12/10/21:  PROM per protocol STM upper trap, levator, pecs Putty grip Scapular retraction  PATIENT EDUCATION: Education details: Anatomy of condition, POC, HEP, exercise form/rationale Person educated: Patient and Spouse Education method: Explanation, Demonstration, Tactile cues, Verbal cues, and Handouts Education comprehension: verbalized understanding, returned demonstration, verbal cues required, tactile cues required, and needs further education  HOME EXERCISE  PROGRAM: [id number]  ASSESSMENT:  CLINICAL IMPRESSION: Patient is a 61 y.o. F who was seen today for physical therapy evaluation and treatment for left rotator cuff repair, subacromial decompression, biceps tenodesis.  She has been doing home health therapy since her surgery where she reports she was doing active range of motion with a long lever arm to shoulder height.  It was painful but I do not see reason to believe that there was any damage done at evaluation.  Will continue per protocol.  Patient reports she worked for [employer] for many years and was very very strong and would like to get back to previous strength.  We discussed the importance of taking time to heal and follow protocol as it has been quite sometime since the initial injury.  OBJECTIVE IMPAIRMENTS: decreased activity tolerance, decreased ROM, decreased strength, increased muscle spasms, impaired flexibility, impaired UE functional use, improper body mechanics, postural dysfunction, and pain.     REHAB POTENTIAL: Good  CLINICAL DECISION MAKING: Stable/uncomplicated  EVALUATION COMPLEXITY: Low   GOALS: Goals reviewed with patient? Yes  SHORT TERM GOALS: 4 weeks post op  Passive flexion to 140 deg without increase in pain Baseline: Goal status: INITIAL  2.  Passive abd to 60 deg without increase in pain Baseline:  Goal status: INITIAL  3.  Passive ER at side to 40 deg without increase in pain Baseline:  Goal status: INITIAL  4.  Able to demo proper scapular retraction for proximal shoulder girdle support Baseline:  Goal status: INITIAL   LONG TERM GOALS:   Able to demo AROM to shoulder height, against gravity, with good scapular control Baseline:  Goal status: INITIAL  Target date: 8 weeks post op: 01/17/22  2.  Will begin light resistance exercises pain <=3/10 Baseline:  Goal status: INITIAL Target date:   8 weeks post op  3.  Full AROM within 10 deg of opp UE without increase in pain Baseline:  Goal  status: INITIAL Target date:   12 weeks post op: 02/14/22  4.  Proper scapular control in proprioceptive and CKC strengthening Baseline:  Goal status: INITIAL Target date:   12 weeks post op  5.  Able to lift cups/plates and other small objects into overhead cabinets without limitation by shoulder Baseline:  Goal status: INITIAL Target date:   12 weeks post op  Further LTGs to be set at POC date PRN   PLAN:  PT FREQUENCY: 1-2x/week  PT DURATION: 12 weeks  PLANNED INTERVENTIONS: Therapeutic exercises, Therapeutic activity, Neuromuscular re-education, Balance training, Gait training, Patient/Family education, Self Care, Joint mobilization, Aquatic Therapy, Dry Needling, Electrical stimulation, Spinal mobilization, Cryotherapy, Moist heat, Taping, Traction, Ionotophoresis '4mg'$ /ml Dexamethasone, Manual therapy, and Re-evaluation  PLAN FOR NEXT SESSION: continue per protocol   Yovan Leeman C. Dominga Mcduffie PT, DPT 12/11/21 10:04 AM

## 2021-12-13 ENCOUNTER — Other Ambulatory Visit (HOSPITAL_BASED_OUTPATIENT_CLINIC_OR_DEPARTMENT_OTHER): Payer: Self-pay | Admitting: Orthopaedic Surgery

## 2021-12-14 ENCOUNTER — Encounter (HOSPITAL_BASED_OUTPATIENT_CLINIC_OR_DEPARTMENT_OTHER): Payer: Self-pay | Admitting: Physical Therapy

## 2021-12-20 ENCOUNTER — Encounter: Payer: Self-pay | Admitting: Internal Medicine

## 2021-12-20 ENCOUNTER — Ambulatory Visit (INDEPENDENT_AMBULATORY_CARE_PROVIDER_SITE_OTHER): Payer: Medicare HMO | Admitting: Internal Medicine

## 2021-12-20 VITALS — BP 140/80 | HR 88 | Temp 98.0°F | Ht 66.0 in | Wt 170.0 lb

## 2021-12-20 DIAGNOSIS — F411 Generalized anxiety disorder: Secondary | ICD-10-CM

## 2021-12-20 DIAGNOSIS — F4310 Post-traumatic stress disorder, unspecified: Secondary | ICD-10-CM

## 2021-12-20 DIAGNOSIS — F5101 Primary insomnia: Secondary | ICD-10-CM

## 2021-12-20 DIAGNOSIS — M48061 Spinal stenosis, lumbar region without neurogenic claudication: Secondary | ICD-10-CM

## 2021-12-20 DIAGNOSIS — E785 Hyperlipidemia, unspecified: Secondary | ICD-10-CM | POA: Diagnosis not present

## 2021-12-20 DIAGNOSIS — R69 Illness, unspecified: Secondary | ICD-10-CM | POA: Diagnosis not present

## 2021-12-20 DIAGNOSIS — M75112 Incomplete rotator cuff tear or rupture of left shoulder, not specified as traumatic: Secondary | ICD-10-CM | POA: Diagnosis not present

## 2021-12-20 DIAGNOSIS — M4722 Other spondylosis with radiculopathy, cervical region: Secondary | ICD-10-CM | POA: Diagnosis not present

## 2021-12-20 NOTE — Progress Notes (Signed)
   Subjective:   Patient ID: Grace Taylor, female    DOB: [birth year], 61 y.o.   MRN: [id number]  HPI The patient is a new 61 YO female coming in for ongoing care.  PMH, [facility], social history reviewed and updated  Review of Systems  Objective:  Physical Exam  Vitals:   12/20/21 1502 12/20/21 1510  BP: (!) 140/80 (!) 140/80  Pulse: 88   Temp: 98 F (36.7 C)   TempSrc: Oral   SpO2: 99%   Weight: 170 lb (77.1 kg)   Height: '5\' 6"'$  (1.676 m)     Assessment & Plan:  Visit time {} minutes in face to face communication with patient and coordination of care, additional {} minutes spent in record review, coordination or care, ordering tests, communicating/referring to other healthcare professionals, documenting in medical records all on the same day of the visit for total time {} minutes spent on the visit.

## 2021-12-20 NOTE — Patient Instructions (Signed)
We do not need labs today.  If you wanted we could try switching ambien to trazodone to help with sleep and be safer long term.

## 2021-12-21 DIAGNOSIS — G4700 Insomnia, unspecified: Secondary | ICD-10-CM | POA: Insufficient documentation

## 2021-12-21 NOTE — Assessment & Plan Note (Signed)
Taking clonazepam 0.5 mg TID and no other agent. She does not really recall trying anything recently for this. Maybe zoloft years ago. We discussed long term safety issues and addiction risk with these. She was unaware.

## 2021-12-21 NOTE — Assessment & Plan Note (Signed)
Recent shoulder surgery and she is about 1 month out. Recovery is around 6 months. She is able to do ADLs and has adequate assistance to help her at home currently.

## 2021-12-21 NOTE — Assessment & Plan Note (Signed)
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

## 2021-12-21 NOTE — Assessment & Plan Note (Signed)
Unable to tolerate statins due to elevated LFTs and myalgia. Is taking praluent and zetia currently but does not take praluent as prescribed. She is working on trying to take this consistently. Last lipid panel improved but not to goal. She is getting recheck in the next few months.

## 2021-12-21 NOTE — Assessment & Plan Note (Signed)
Sees neurosurgery and is currently having bad pain. Using tylenol and advil and not well controlled.

## 2021-12-21 NOTE — Assessment & Plan Note (Signed)
She is taking clonazepam 0.5 mg TID from psychiatry and has for many years. She does not recall trying anything else recently but many years ago maybe zoloft made her gain weight. Would recommend in the future she consider cymbalta as this could help with concurrent pain due to joint problems as well as help with her mental health. She will consider and we may focus on changing sleep medicine first and then this if able. She is still seeing psychiatry for now.

## 2021-12-21 NOTE — Assessment & Plan Note (Signed)
She is currently taking ambien 10 mg daily for sleep and concurrent clonazepam for anxiety. Would not recommend combination and have recommended she and her psychiatrist consider switch to trazodone. Ambien was the first medication she had ever tried for sleep many years ago and has stayed on this. She was explained that this combination can cause memory changes, increased risk of fall and she may want change. Extensive counseling about this.

## 2022-01-05 ENCOUNTER — Encounter (HOSPITAL_BASED_OUTPATIENT_CLINIC_OR_DEPARTMENT_OTHER): Payer: Self-pay | Admitting: Physical Therapy

## 2022-01-05 ENCOUNTER — Ambulatory Visit (INDEPENDENT_AMBULATORY_CARE_PROVIDER_SITE_OTHER): Payer: Medicare HMO | Admitting: Orthopaedic Surgery

## 2022-01-05 ENCOUNTER — Ambulatory Visit (HOSPITAL_BASED_OUTPATIENT_CLINIC_OR_DEPARTMENT_OTHER): Payer: Medicare HMO | Attending: Orthopaedic Surgery | Admitting: Physical Therapy

## 2022-01-05 DIAGNOSIS — M25612 Stiffness of left shoulder, not elsewhere classified: Secondary | ICD-10-CM

## 2022-01-05 DIAGNOSIS — S46012A Strain of muscle(s) and tendon(s) of the rotator cuff of left shoulder, initial encounter: Secondary | ICD-10-CM

## 2022-01-05 DIAGNOSIS — M25512 Pain in left shoulder: Secondary | ICD-10-CM

## 2022-01-05 NOTE — Progress Notes (Signed)
Post Operative Evaluation    Procedure/Date of Surgery: Left shoulder rotator cuff repair with collagen patch augmentation and biceps tenodesis 11/20  Interval History:    Presents today 6 weeks status post the above procedure.  At this time she is continuing to improve.  She is having difficult time attending physical therapy has been compliant with sling usage.  She does endorse some shoulder tightness.   PMH/PSH/Family History/Social History/Meds/Allergies:    Past Medical History:  Diagnosis Date   Allergy    Anemia    Benign colon polyp 04/07/2017   Q 5 year recall   Chronic neck pain - post injury, worker's comp 2013, 2018 04/07/2017   Dr. Mardelle Matte   Clostridium difficile infection    History of peptic ulcer 04/07/2017   Hyperlipidemia    IBS (irritable bowel syndrome) 04/07/2017   Colonoscopy by Carlean Purl   Migraines    PTSD (post-traumatic stress disorder) 04/07/2017   Ulcer    age 62   Past Surgical History:  Procedure Laterality Date   CHOLECYSTECTOMY  2012   COLONOSCOPY  2014   DG GALL BLADDER     2012   ESOPHAGOGASTRODUODENOSCOPY  2014   FLEXIBLE SIGMOIDOSCOPY  03/2014   OVARIAN CYST REMOVAL     right shoulder surgery     SHOULDER ARTHROSCOPY WITH ROTATOR CUFF REPAIR AND OPEN BICEPS TENODESIS Left 11/22/2021   Procedure: LEFT SHOULDER ARTHROSCOPY WITH ROTATOR CUFF REPAIR AND BICEPS TENODESIS;  Surgeon: Vanetta Mulders, MD;  Location: [place];  Service: Orthopedics;  Laterality: Left;   TMJ ARTHROSCOPY     Social History   Socioeconomic History   Marital status: Divorced    Spouse name: Not on file   Number of children: 0   Years of education: some college   Highest education level: Not on file  Occupational History   Occupation: former Control and instrumentation engineer   Occupation: disabled  Tobacco Use   Smoking status: Never   Smokeless tobacco: Never  Vaping Use   Vaping Use: Never used  Substance and Sexual Activity   Alcohol use:  Not Currently   Drug use: No   Sexual activity: Yes    Birth control/protection: Condom  Other Topics Concern   Not on file  Social History Narrative   Worked at the [employer], on SSD or SSI - but no health insurance   Social Determinants of Health   Financial Resource Strain: Medium Risk (04/07/2017)   Overall Financial Resource Strain (CARDIA)    Difficulty of Paying Living Expenses: Somewhat hard  Food Insecurity: Food Insecurity Present (04/07/2017)   Hunger Vital Sign    Worried About Running Out of Food in the Last Year: Sometimes true    Ran Out of Food in the Last Year: Not on file  Transportation Needs: Not on file  Physical Activity: Not on file  Stress: Stress Concern Present (04/07/2017)   [place]    Feeling of Stress : Very much  Social Connections: Not on file   Family History  Problem Relation Age of Onset   Hypertension Mother    Alcohol abuse Mother    Arthritis Mother    Cancer Mother    Heart disease Mother    Kidney disease Mother    Mental illness Mother  Hypertension Father    CAD Father 2       CABG    Hyperlipidemia Father    Heart disease Father    Early death Father    Heart attack Father    Hypertension Brother    Alcohol abuse Brother    Heart disease Brother    Breast cancer Maternal Aunt    Cancer Maternal Aunt        breast cancer   Hypertension Sister    Hyperlipidemia Sister    Arthritis Sister    Allergies  Allergen Reactions   Claritin [Loratadine]     Affects eyes    Codeine     Hyperactivity    Crestor [Rosuvastatin]     INEFFECTIVE AT LOWERING LDL   Lipitor [Atorvastatin]     INEFFECTIVE AT LOWERING LDL   Penicillins Hives   Simvastatin     ELEVATED LIVER ENZYMES   Sulfonamide Derivatives Nausea And Vomiting and Rash   Current Outpatient Medications  Medication Sig Dispense Refill   Alirocumab (PRALUENT) 150 MG/ML SOAJ INJECT 150 MG INTO THE  SKIN EVERY 14 DAYS. 6 mL 0   Ascorbic Acid (VITAMIN C) 1000 MG tablet Take 1,000 mg by mouth daily.     aspirin EC 325 MG tablet Take 1 tablet (325 mg total) by mouth daily. 30 tablet 0   Calcium-Magnesium-Zinc (CAL-MAG-ZINC PO) Take 1 tablet by mouth daily.     Carboxymethylcellulose Sodium (THERATEARS OP) Place 1 drop into both eyes daily as needed (irritation).     clonazePAM (KLONOPIN) 0.5 MG tablet Take 0.5-1 tablets by mouth See admin instructions. Take 0.5 mg in the morning and 1 mg in the evening  1   ezetimibe (ZETIA) 10 MG tablet Take 1 tablet (10 mg total) by mouth daily. 90 tablet 3   famotidine (PEPCID) 40 MG tablet Take 1 tablet (40 mg total) by mouth every evening. (Patient taking differently: Take 40 mg by mouth daily as needed for heartburn.) 90 tablet 1   Ferrous Sulfate (IRON PO) Take 25 mg by mouth daily.     fluticasone (FLONASE) 50 MCG/ACT nasal spray Place 2 sprays into both nostrils daily as needed for allergies or rhinitis.     ibuprofen (ADVIL) 800 MG tablet TAKE 1 TABLET EVERY 8 HOURS FOR 10 DAYS. PLEASE TAKE WITH FOOD, PLEASE ALTERNATE WITH ACETAMINOPHEN 30 tablet 0   magnesium gluconate (MAGONATE) 500 MG tablet Take 500 mg by mouth 2 (two) times daily.     METAMUCIL FIBER PO Take 4 capsules by mouth 2 (two) times daily.     Multiple Vitamins-Minerals (ADULT GUMMY PO) Take 2 capsules by mouth daily.     Multiple Vitamins-Minerals (HAIR SKIN NAILS) CAPS Take 1 tablet by mouth daily.     niacin (VITAMIN B3) 500 MG tablet Take 500 mg by mouth daily.     Omega-3 1000 MG CAPS Take 2,000 mg by mouth daily.     omeprazole (PRILOSEC) 40 MG capsule TAKE 1 CAPSULE BY MOUTH IN THE MORNING AND AT BEDTIME. 60 capsule 5   Polyethyl Glycol-Propyl Glycol (SYSTANE OP) Place 1 drop into both eyes daily as needed (irritation).     Probiotic Product (PROBIOTIC PO) Take 1 capsule by mouth daily.     vitamin B-12 (CYANOCOBALAMIN) 500 MCG tablet Take 500 mcg by mouth daily.     zolpidem  (AMBIEN) 10 MG tablet Take 10 mg by mouth at bedtime.      No current facility-administered medications for this visit.  No results found.  Review of Systems:   A ROS was performed including pertinent positives and negatives as documented in the HPI.   Musculoskeletal Exam:    There were no vitals taken for this visit.  Left shoulder incisions are well-healed.  In the passive supine position she is able to forward elevate to 120 degrees with external rotation at side to 40.  Internal rotation deferred today.  Remainder of neurosensory exam is intact  Imaging:      I personally reviewed and interpreted the radiographs.   Assessment:   6 weeks status post left shoulder rotator cuff repair with collagen patch augmentation and biceps tenodesis.  Overall she is continuing to improve.  At this time she may begin active range of motion.  She will also work on active assisted range of motion.  She will discontinue her sling.  I will see her back in 6 weeks for reassessment   Plan :    -Return to clinic in 6 weeks for reassessment      I personally saw and evaluated the patient, and participated in the management and treatment plan.  Vanetta Mulders, MD Attending Physician, Orthopedic Surgery  This document was dictated using Dragon voice recognition software. A reasonable attempt at proof reading has been made to minimize errors.

## 2022-01-05 NOTE — Therapy (Signed)
OUTPATIENT PHYSICAL THERAPY SHOULDER TREATMENT   Patient Name: Grace Taylor MRN: [id number] DOB:December 10, 1960, 62 y.o., female Today's Date: 01/05/2022  END OF SESSION:  PT End of Session - 01/05/22 1239     Visit Number 2    Number of Visits 25    Date for PT Re-Evaluation 03/04/22    Authorization Type [employer]    PT Start Time 1145    PT Stop Time 1218    PT Time Calculation (min) 33 min    Activity Tolerance Patient tolerated treatment well    Behavior During Therapy [facility] for tasks assessed/performed              Past Medical History:  Diagnosis Date   Allergy    Anemia    Benign colon polyp 04/07/2017   Q 5 year recall   Chronic neck pain - post injury, worker's comp 2013, 2018 04/07/2017   Dr. Mardelle Matte   Clostridium difficile infection    History of peptic ulcer 04/07/2017   Hyperlipidemia    IBS (irritable bowel syndrome) 04/07/2017   Colonoscopy by Carlean Purl   Migraines    PTSD (post-traumatic stress disorder) 04/07/2017   Ulcer    age 86   Past Surgical History:  Procedure Laterality Date   CHOLECYSTECTOMY  2012   COLONOSCOPY  2014   DG GALL BLADDER     2012   ESOPHAGOGASTRODUODENOSCOPY  2014   FLEXIBLE SIGMOIDOSCOPY  03/2014   OVARIAN CYST REMOVAL     right shoulder surgery     SHOULDER ARTHROSCOPY WITH ROTATOR CUFF REPAIR AND OPEN BICEPS TENODESIS Left 11/22/2021   Procedure: LEFT SHOULDER ARTHROSCOPY WITH ROTATOR CUFF REPAIR AND BICEPS TENODESIS;  Surgeon: Vanetta Mulders, MD;  Location: [place];  Service: Orthopedics;  Laterality: Left;   TMJ ARTHROSCOPY     Patient Active Problem List   Diagnosis Date Noted   Insomnia 12/21/2021   Other spondylosis with radiculopathy, cervical region 04/21/2021   Foraminal stenosis of cervical region 04/21/2021   Elevated coronary artery calcium score 06/10/2020   Candida laryngitis 10/22/2019   Hoarseness 10/22/2019   Laryngospasms 10/22/2019   Globus sensation 10/22/2019   Lumbar spinal stenosis  07/05/2019   GAD (generalized anxiety disorder) 06/20/2019   Left-sided Bell's palsy 04/04/2019   Closed nondisplaced fracture of distal phalanx of right ring finger 04/03/2019   Lumbosacral disc herniation 12/14/2018   Cervical spinal stenosis 12/14/2018   Nontraumatic incomplete tear of left rotator cuff 06/01/2018   Osteoarthritis of right subtalar joint 03/01/2018   PTSD (post-traumatic stress disorder) 04/07/2017   Irritable bowel syndrome with diarrhea 04/07/2017   History of Clostridium difficile colitis 04/07/2017   History of peptic ulcer 04/07/2017   Chronic neck pain - post injury, worker's comp 2013, 2018 04/07/2017   PVC's (premature ventricular contractions) 10/11/2016   Dyslipidemia 10/11/2016    REFERRING PROVIDER: Sammuel Hines, MD  REFERRING DIAG:  S46.012A (ICD-10-CM) - Traumatic complete tear of left rotator cuff, initial encounter  S/p RCR, SAD biceps tenodesis on 11/22/21    THERAPY DIAG:  Acute pain of left shoulder  Stiffness of left shoulder, not elsewhere classified  Rationale for Evaluation and Treatment: Rehabilitation  ONSET DATE: DOS 11/22/21  Days since surgery: 44   SUBJECTIVE:  SUBJECTIVE STATEMENT: Pt states she has had pain with lifting it. Visit with the MD went well. No complaints  PERTINENT HISTORY: See problem list  PAIN:  Are you having pain? No  PRECAUTIONS: Shoulder  WEIGHT BEARING RESTRICTIONS: No  FALLS:  Has patient fallen in last 6 months? No  OCCUPATION: Not working  PLOF: Independent  PATIENT GOALS:be able to function normally, lift something up, rebuild upper body strength   OBJECTIVE:   PATIENT SURVEYS:  FOTO 40  TODAY'S TREATMENT:         Treatment                           01/05/22  PROM with LAD and grad I-II oscillation at  end range  Table slide flexion and ABD 2x10 3s        Cane ABD 10x Cane ER 3s 15x             Standing table biceps stretch 10s 5x             Shoulder pulley 2 mins flexion                                                                                                   Treatment                            12/10/21:  PROM per protocol STM upper trap, levator, pecs Putty grip Scapular retraction  PATIENT EDUCATION: Education details: Anatomy of condition, POC, HEP, exercise form/rationale Person educated: Patient and Spouse Education method: Explanation, Demonstration, Tactile cues, Verbal cues, and Handouts Education comprehension: verbalized understanding, returned demonstration, verbal cues required, tactile cues required, and needs further education  HOME EXERCISE PROGRAM: [email or web address]  ASSESSMENT:  CLINICAL IMPRESSION: Pt able to progress to light AAROM and passive ROM at today's session. Pt returns follow scheduling conflicts with clinic. Pt is 6 weeks out at this time. Pt has well managed pain but discomfort at end range. Plan to continue per protocol moving forward. Pt would benefit from continued skilled therapy in order to reach goals and maximize functional L UE strength and ROM for full return to PLOF.   OBJECTIVE IMPAIRMENTS: decreased activity tolerance, decreased ROM, decreased strength, increased muscle spasms, impaired flexibility, impaired UE functional use, improper body mechanics, postural dysfunction, and pain.     REHAB POTENTIAL: Good  CLINICAL DECISION MAKING: Stable/uncomplicated  EVALUATION COMPLEXITY: Low   GOALS: Goals reviewed with patient? Yes  SHORT TERM GOALS: 4 weeks post op  Passive flexion to 140 deg without increase in pain Baseline: Goal status: INITIAL  2.  Passive abd to 60 deg without increase in pain Baseline:  Goal status: INITIAL  3.  Passive ER at side to 40 deg without increase in pain Baseline:  Goal status:  INITIAL  4.  Able to demo proper scapular retraction for proximal shoulder girdle support Baseline:  Goal status: INITIAL   LONG TERM GOALS:   Able to demo AROM to shoulder height, against gravity, with  good scapular control Baseline:  Goal status: INITIAL  Target date: 8 weeks post op: 01/17/22  2.  Will begin light resistance exercises pain <=3/10 Baseline:  Goal status: INITIAL Target date:   8 weeks post op  3.  Full AROM within 10 deg of opp UE without increase in pain Baseline:  Goal status: INITIAL Target date:   12 weeks post op: 02/14/22  4.  Proper scapular control in proprioceptive and CKC strengthening Baseline:  Goal status: INITIAL Target date:   12 weeks post op  5.  Able to lift cups/plates and other small objects into overhead cabinets without limitation by shoulder Baseline:  Goal status: INITIAL Target date:   12 weeks post op  Further LTGs to be set at POC date PRN   PLAN:  PT FREQUENCY: 1-2x/week  PT DURATION: 12 weeks  PLANNED INTERVENTIONS: Therapeutic exercises, Therapeutic activity, Neuromuscular re-education, Balance training, Gait training, Patient/Family education, Self Care, Joint mobilization, Aquatic Therapy, Dry Needling, Electrical stimulation, Spinal mobilization, Cryotherapy, Moist heat, Taping, Traction, Ionotophoresis '4mg'$ /ml Dexamethasone, Manual therapy, and Re-evaluation  PLAN FOR NEXT SESSION: continue per protocol  Daleen Bo PT, DPT 01/05/22 12:41 PM

## 2022-01-06 DIAGNOSIS — F4323 Adjustment disorder with mixed anxiety and depressed mood: Secondary | ICD-10-CM | POA: Diagnosis not present

## 2022-01-06 DIAGNOSIS — R69 Illness, unspecified: Secondary | ICD-10-CM | POA: Diagnosis not present

## 2022-01-07 ENCOUNTER — Encounter (HOSPITAL_BASED_OUTPATIENT_CLINIC_OR_DEPARTMENT_OTHER): Payer: Self-pay | Admitting: Physical Therapy

## 2022-01-07 ENCOUNTER — Ambulatory Visit (HOSPITAL_BASED_OUTPATIENT_CLINIC_OR_DEPARTMENT_OTHER): Payer: Medicare HMO | Admitting: Physical Therapy

## 2022-01-07 DIAGNOSIS — M25612 Stiffness of left shoulder, not elsewhere classified: Secondary | ICD-10-CM

## 2022-01-07 DIAGNOSIS — M25512 Pain in left shoulder: Secondary | ICD-10-CM | POA: Diagnosis not present

## 2022-01-07 NOTE — Therapy (Signed)
OUTPATIENT PHYSICAL THERAPY SHOULDER TREATMENT   Patient Name: Grace Taylor MRN: [id number] DOB:[birth year], 62 y.o., female Today's Date: 01/07/2022  END OF SESSION:  PT End of Session - 01/07/22 1230     Visit Number 3    Number of Visits 25    Date for PT Re-Evaluation 03/04/22    Authorization Type [employer]et[employer]    PT Start Time 1230    PT Stop Time 1311    PT Time Calculation (min) 41 min    Activity Tolerance Patient tolerated treatment well    Behavior During Therapy [facility] for tasks assessed/performed              Past Medical History:  Diagnosis Date   Allergy    Anemia    Benign colon polyp 04/07/2017   Q 5 year recall   Chronic neck pain - post injury, worker's comp 2013, 2018 04/07/2017   Dr. Mardelle Matte   Clostridium difficile infection    History of peptic ulcer 04/07/2017   Hyperlipidemia    IBS (irritable bowel syndrome) 04/07/2017   Colonoscopy by Carlean Purl   Migraines    PTSD (post-traumatic stress disorder) 04/07/2017   Ulcer    age 15   Past Surgical History:  Procedure Laterality Date   CHOLECYSTECTOMY  2012   COLONOSCOPY  2014   DG GALL BLADDER     2012   ESOPHAGOGASTRODUODENOSCOPY  2014   FLEXIBLE SIGMOIDOSCOPY  03/2014   OVARIAN CYST REMOVAL     right shoulder surgery     SHOULDER ARTHROSCOPY WITH ROTATOR CUFF REPAIR AND OPEN BICEPS TENODESIS Left 11/22/2021   Procedure: LEFT SHOULDER ARTHROSCOPY WITH ROTATOR CUFF REPAIR AND BICEPS TENODESIS;  Surgeon: Vanetta Mulders, MD;  Location: [place];  Service: Orthopedics;  Laterality: Left;   TMJ ARTHROSCOPY     Patient Active Problem List   Diagnosis Date Noted   Insomnia 12/21/2021   Other spondylosis with radiculopathy, cervical region 04/21/2021   Foraminal stenosis of cervical region 04/21/2021   Elevated coronary artery calcium score 06/10/2020   Candida laryngitis 10/22/2019   Hoarseness 10/22/2019   Laryngospasms 10/22/2019   Globus sensation 10/22/2019   Lumbar spinal stenosis  07/05/2019   GAD (generalized anxiety disorder) 06/20/2019   Left-sided Bell's palsy 04/04/2019   Closed nondisplaced fracture of distal phalanx of right ring finger 04/03/2019   Lumbosacral disc herniation 12/14/2018   Cervical spinal stenosis 12/14/2018   Nontraumatic incomplete tear of left rotator cuff 06/01/2018   Osteoarthritis of right subtalar joint 03/01/2018   PTSD (post-traumatic stress disorder) 04/07/2017   Irritable bowel syndrome with diarrhea 04/07/2017   History of Clostridium difficile colitis 04/07/2017   History of peptic ulcer 04/07/2017   Chronic neck pain - post injury, worker's comp 2013, 2018 04/07/2017   PVC's (premature ventricular contractions) 10/11/2016   Dyslipidemia 10/11/2016    REFERRING PROVIDER: Sammuel Hines, MD  REFERRING DIAG:  S46.012A (ICD-10-CM) - Traumatic complete tear of left rotator cuff, initial encounter  S/p RCR, SAD biceps tenodesis on 11/22/21    THERAPY DIAG:  Acute pain of left shoulder  Stiffness of left shoulder, not elsewhere classified  Rationale for Evaluation and Treatment: Rehabilitation  ONSET DATE: DOS 11/22/21  Days since surgery: 75   SUBJECTIVE:  SUBJECTIVE STATEMENT: I don't take the medicine but I do what I am supposed to.   PERTINENT HISTORY: See problem list  PAIN:  Are you having pain? No  PRECAUTIONS: Shoulder  WEIGHT BEARING RESTRICTIONS: No  FALLS:  Has patient fallen in last 6 months? No  OCCUPATION: Not working  PLOF: Independent  PATIENT GOALS:be able to function normally, lift something up, rebuild upper body strength   OBJECTIVE:   PATIENT SURVEYS:  FOTO 40  TODAY'S TREATMENT:         Treatment                            01/07/22:  PROM shoulder flexion, STM to subscapularis Supine chest press x10  AAROM Seated AAROM scaption Scap retraction & AROM UE ER Bent over row & triceps kick    Treatment                           01/05/22  PROM with LAD and grad I-II oscillation at end range  Table slide flexion and ABD 2x10 3s        Cane ABD 10x Cane ER 3s 15x             Standing table biceps stretch 10s 5x             Shoulder pulley 2 mins flexion                                                                                                   Treatment                            12/10/21:  PROM per protocol STM upper trap, levator, pecs Putty grip Scapular retraction  PATIENT EDUCATION: Education details: Anatomy of condition, POC, HEP, exercise form/rationale Person educated: Patient and Spouse Education method: Explanation, Demonstration, Tactile cues, Verbal cues, and Handouts Education comprehension: verbalized understanding, returned demonstration, verbal cues required, tactile cues required, and needs further education  HOME EXERCISE PROGRAM: [id number]  ASSESSMENT:  CLINICAL IMPRESSION: Progressing very well, requires frequent cuing.    OBJECTIVE IMPAIRMENTS: decreased activity tolerance, decreased ROM, decreased strength, increased muscle spasms, impaired flexibility, impaired UE functional use, improper body mechanics, postural dysfunction, and pain.     REHAB POTENTIAL: Good  CLINICAL DECISION MAKING: Stable/uncomplicated  EVALUATION COMPLEXITY: Low   GOALS: Goals reviewed with patient? Yes  SHORT TERM GOALS: 4 weeks post op  Passive flexion to 140 deg without increase in pain Baseline: Goal status:achieved  2.  Passive abd to 60 deg without increase in pain Baseline:  Goal status:achieved  3.  Passive ER at side to 40 deg without increase in pain Baseline:  Goal status:achieved  4.  Able to demo proper scapular retraction for proximal shoulder girdle support Baseline:  Goal status: partially met- requires cuing during exercises   LONG  TERM GOALS:   Able to demo AROM to shoulder height, against gravity, with good scapular control Baseline:  Goal status: INITIAL  Target date: 8 weeks post op: 01/17/22  2.  Will begin light resistance exercises pain <=3/10 Baseline:  Goal status: INITIAL Target date:   8 weeks post op  3.  Full AROM within 10 deg of opp UE without increase in pain Baseline:  Goal status: INITIAL Target date:   12 weeks post op: 02/14/22  4.  Proper scapular control in proprioceptive and CKC strengthening Baseline:  Goal status: INITIAL Target date:   12 weeks post op  5.  Able to lift cups/plates and other small objects into overhead cabinets without limitation by shoulder Baseline:  Goal status: INITIAL Target date:   12 weeks post op  Further LTGs to be set at POC date PRN   PLAN:  PT FREQUENCY: 1-2x/week  PT DURATION: 12 weeks  PLANNED INTERVENTIONS: Therapeutic exercises, Therapeutic activity, Neuromuscular re-education, Balance training, Gait training, Patient/Family education, Self Care, Joint mobilization, Aquatic Therapy, Dry Needling, Electrical stimulation, Spinal mobilization, Cryotherapy, Moist heat, Taping, Traction, Ionotophoresis '4mg'$ /ml Dexamethasone, Manual therapy, and Re-evaluation  PLAN FOR NEXT SESSION: continue per protocol  Maddox Hlavaty C. Roddy Bellamy PT, DPT 01/07/22 1:23 PM

## 2022-01-10 ENCOUNTER — Ambulatory Visit (HOSPITAL_BASED_OUTPATIENT_CLINIC_OR_DEPARTMENT_OTHER): Payer: Medicare HMO | Admitting: Physical Therapy

## 2022-01-10 ENCOUNTER — Encounter (HOSPITAL_BASED_OUTPATIENT_CLINIC_OR_DEPARTMENT_OTHER): Payer: Self-pay | Admitting: Physical Therapy

## 2022-01-10 DIAGNOSIS — M25512 Pain in left shoulder: Secondary | ICD-10-CM | POA: Diagnosis not present

## 2022-01-10 DIAGNOSIS — M25612 Stiffness of left shoulder, not elsewhere classified: Secondary | ICD-10-CM

## 2022-01-10 NOTE — Therapy (Signed)
OUTPATIENT PHYSICAL THERAPY SHOULDER TREATMENT   Patient Name: Grace Taylor MRN: [id number] DOB:August 23, 1960, 62 y.o., female Today's Date: 01/10/2022  END OF SESSION:  PT End of Session - 01/10/22 0947     Visit Number 4    Number of Visits 25    Date for PT Re-Evaluation 03/04/22    Authorization Type [employer]etna    PT Start Time 0930    PT Stop Time 1000    PT Time Calculation (min) 30 min    Activity Tolerance Patient tolerated treatment well    Behavior During Therapy [facility] for tasks assessed/performed               Past Medical History:  Diagnosis Date   Allergy    Anemia    Benign colon polyp 04/07/2017   Q 5 year recall   Chronic neck pain - post injury, worker's comp 2013, 2018 04/07/2017   Dr. Mardelle Matte   Clostridium difficile infection    History of peptic ulcer 04/07/2017   Hyperlipidemia    IBS (irritable bowel syndrome) 04/07/2017   Colonoscopy by Carlean Purl   Migraines    PTSD (post-traumatic stress disorder) 04/07/2017   Ulcer    age 43   Past Surgical History:  Procedure Laterality Date   CHOLECYSTECTOMY  2012   COLONOSCOPY  2014   DG GALL BLADDER     2012   ESOPHAGOGASTRODUODENOSCOPY  2014   FLEXIBLE SIGMOIDOSCOPY  03/2014   OVARIAN CYST REMOVAL     right shoulder surgery     SHOULDER ARTHROSCOPY WITH ROTATOR CUFF REPAIR AND OPEN BICEPS TENODESIS Left 11/22/2021   Procedure: LEFT SHOULDER ARTHROSCOPY WITH ROTATOR CUFF REPAIR AND BICEPS TENODESIS;  Surgeon: Vanetta Mulders, MD;  Location: [place];  Service: Orthopedics;  Laterality: Left;   TMJ ARTHROSCOPY     Patient Active Problem List   Diagnosis Date Noted   Insomnia 12/21/2021   Other spondylosis with radiculopathy, cervical region 04/21/2021   Foraminal stenosis of cervical region 04/21/2021   Elevated coronary artery calcium score 06/10/2020   Candida laryngitis 10/22/2019   Hoarseness 10/22/2019   Laryngospasms 10/22/2019   Globus sensation 10/22/2019   Lumbar spinal stenosis  07/05/2019   GAD (generalized anxiety disorder) 06/20/2019   Left-sided Bell's palsy 04/04/2019   Closed nondisplaced fracture of distal phalanx of right ring finger 04/03/2019   Lumbosacral disc herniation 12/14/2018   Cervical spinal stenosis 12/14/2018   Nontraumatic incomplete tear of left rotator cuff 06/01/2018   Osteoarthritis of right subtalar joint 03/01/2018   PTSD (post-traumatic stress disorder) 04/07/2017   Irritable bowel syndrome with diarrhea 04/07/2017   History of Clostridium difficile colitis 04/07/2017   History of peptic ulcer 04/07/2017   Chronic neck pain - post injury, worker's comp 2013, 2018 04/07/2017   PVC's (premature ventricular contractions) 10/11/2016   Dyslipidemia 10/11/2016    REFERRING PROVIDER: Sammuel Hines, MD  REFERRING DIAG:  S46.012A (ICD-10-CM) - Traumatic complete tear of left rotator cuff, initial encounter  S/p RCR, SAD biceps tenodesis on 11/22/21    THERAPY DIAG:  Acute pain of left shoulder  Stiffness of left shoulder, not elsewhere classified  Rationale for Evaluation and Treatment: Rehabilitation  ONSET DATE: DOS 11/22/21  Days since surgery: 72   SUBJECTIVE:  SUBJECTIVE STATEMENT: Pt does well but the shoulder is a little sore from previous HEP but no issues otherwise. Compliant with new HEP.   PERTINENT HISTORY: See problem list  PAIN:  Are you having pain? No  PRECAUTIONS: Shoulder  WEIGHT BEARING RESTRICTIONS: No  FALLS:  Has patient fallen in last 6 months? No  OCCUPATION: Not working  PLOF: Independent  PATIENT GOALS:be able to function normally, lift something up, rebuild upper body strength   OBJECTIVE:   PATIENT SURVEYS:  FOTO 40  TODAY'S TREATMENT:         Treatment                            01/10/22:  PROM shoulder  flexion and ABD to tolerance; LAD with motion STM to L UT, levator, mid trap, and infra Review of HEP and DOMS management  Treatment                            01/07/22:  PROM shoulder flexion, STM to subscapularis Supine chest press x10 AAROM Seated AAROM scaption Scap retraction & AROM UE ER Bent over row & triceps kick    Treatment                           01/05/22  PROM with LAD and grad I-II oscillation at end range  Table slide flexion and ABD 2x10 3s        Cane ABD 10x Cane ER 3s 15x             Standing table biceps stretch 10s 5x             Shoulder pulley 2 mins flexion                                                                                                   Treatment                            12/10/21:  PROM per protocol STM upper trap, levator, pecs Putty grip Scapular retraction  PATIENT EDUCATION: Education details: Anatomy of condition, POC, HEP, exercise form/rationale Person educated: Patient and Spouse Education method: Explanation, Demonstration, Tactile cues, Verbal cues, and Handouts Education comprehension: verbalized understanding, returned demonstration, verbal cues required, tactile cues required, and needs further education  HOME EXERCISE PROGRAM: [id number]  ASSESSMENT:  CLINICAL IMPRESSION: Patient continues to progress well with range of motion at today's session.  Patient does present with increased muscle soreness and hypertonicity of the left upper quarter which is consistent with increase in active assisted range of motion and exercising.  Patient advised to continue with home exercise program at this time for and to reduce upper trap compensation.  Plan to continue per protocol. patient will benefit from continued skilled therapy in order to address functional left upper extremity deficits for return to prior level of function.  OBJECTIVE IMPAIRMENTS: decreased activity tolerance, decreased ROM,  decreased strength, increased muscle  spasms, impaired flexibility, impaired UE functional use, improper body mechanics, postural dysfunction, and pain.     REHAB POTENTIAL: Good  CLINICAL DECISION MAKING: Stable/uncomplicated  EVALUATION COMPLEXITY: Low   GOALS: Goals reviewed with patient? Yes  SHORT TERM GOALS: 4 weeks post op  Passive flexion to 140 deg without increase in pain Baseline: Goal status:achieved  2.  Passive abd to 60 deg without increase in pain Baseline:  Goal status:achieved  3.  Passive ER at side to 40 deg without increase in pain Baseline:  Goal status:achieved  4.  Able to demo proper scapular retraction for proximal shoulder girdle support Baseline:  Goal status: partially met- requires cuing during exercises   LONG TERM GOALS:   Able to demo AROM to shoulder height, against gravity, with good scapular control Baseline:  Goal status: INITIAL  Target date: 8 weeks post op: 01/17/22  2.  Will begin light resistance exercises pain <=3/10 Baseline:  Goal status: INITIAL Target date:   8 weeks post op  3.  Full AROM within 10 deg of opp UE without increase in pain Baseline:  Goal status: INITIAL Target date:   12 weeks post op: 02/14/22  4.  Proper scapular control in proprioceptive and CKC strengthening Baseline:  Goal status: INITIAL Target date:   12 weeks post op  5.  Able to lift cups/plates and other small objects into overhead cabinets without limitation by shoulder Baseline:  Goal status: INITIAL Target date:   12 weeks post op  Further LTGs to be set at POC date PRN   PLAN:  PT FREQUENCY: 1-2x/week  PT DURATION: 12 weeks  PLANNED INTERVENTIONS: Therapeutic exercises, Therapeutic activity, Neuromuscular re-education, Balance training, Gait training, Patient/Family education, Self Care, Joint mobilization, Aquatic Therapy, Dry Needling, Electrical stimulation, Spinal mobilization, Cryotherapy, Moist heat, Taping, Traction, Ionotophoresis '4mg'$ /ml Dexamethasone,  Manual therapy, and Re-evaluation  PLAN FOR NEXT SESSION: continue per protocol  Daleen Bo PT, DPT 01/10/22 10:14 AM

## 2022-01-11 ENCOUNTER — Other Ambulatory Visit (HOSPITAL_BASED_OUTPATIENT_CLINIC_OR_DEPARTMENT_OTHER): Payer: Self-pay | Admitting: Orthopaedic Surgery

## 2022-01-12 ENCOUNTER — Ambulatory Visit (HOSPITAL_BASED_OUTPATIENT_CLINIC_OR_DEPARTMENT_OTHER): Payer: Medicare HMO | Admitting: Physical Therapy

## 2022-01-12 ENCOUNTER — Encounter (HOSPITAL_BASED_OUTPATIENT_CLINIC_OR_DEPARTMENT_OTHER): Payer: Self-pay | Admitting: Physical Therapy

## 2022-01-12 DIAGNOSIS — M25612 Stiffness of left shoulder, not elsewhere classified: Secondary | ICD-10-CM | POA: Diagnosis not present

## 2022-01-12 DIAGNOSIS — M25512 Pain in left shoulder: Secondary | ICD-10-CM | POA: Diagnosis not present

## 2022-01-12 NOTE — Therapy (Signed)
OUTPATIENT PHYSICAL THERAPY SHOULDER TREATMENT   Patient Name: Grace Taylor MRN: [id number] DOB:[birth year], 62 y.o., female Today's Date: 01/12/2022  END OF SESSION:  PT End of Session - 01/12/22 0926     Visit Number 5    Number of Visits 25    Date for PT Re-Evaluation 03/04/22    Authorization Type [employer]    PT Start Time 0930    PT Stop Time 1000    PT Time Calculation (min) 30 min    Activity Tolerance Patient tolerated treatment well    Behavior During Therapy [facility] for tasks assessed/performed               Past Medical History:  Diagnosis Date   Allergy    Anemia    Benign colon polyp 04/07/2017   Q 5 year recall   Chronic neck pain - post injury, worker's comp 2013, 2018 04/07/2017   Dr. Mardelle Matte   Clostridium difficile infection    History of peptic ulcer 04/07/2017   Hyperlipidemia    IBS (irritable bowel syndrome) 04/07/2017   Colonoscopy by Carlean Purl   Migraines    PTSD (post-traumatic stress disorder) 04/07/2017   Ulcer    age 46   Past Surgical History:  Procedure Laterality Date   CHOLECYSTECTOMY  2012   COLONOSCOPY  2014   DG GALL BLADDER     2012   ESOPHAGOGASTRODUODENOSCOPY  2014   FLEXIBLE SIGMOIDOSCOPY  03/2014   OVARIAN CYST REMOVAL     right shoulder surgery     SHOULDER ARTHROSCOPY WITH ROTATOR CUFF REPAIR AND OPEN BICEPS TENODESIS Left 11/22/2021   Procedure: LEFT SHOULDER ARTHROSCOPY WITH ROTATOR CUFF REPAIR AND BICEPS TENODESIS;  Surgeon: Vanetta Mulders, MD;  Location: [place];  Service: Orthopedics;  Laterality: Left;   TMJ ARTHROSCOPY     Patient Active Problem List   Diagnosis Date Noted   Insomnia 12/21/2021   Other spondylosis with radiculopathy, cervical region 04/21/2021   Foraminal stenosis of cervical region 04/21/2021   Elevated coronary artery calcium score 06/10/2020   Candida laryngitis 10/22/2019   Hoarseness 10/22/2019   Laryngospasms 10/22/2019   Globus sensation 10/22/2019   Lumbar spinal stenosis  07/05/2019   GAD (generalized anxiety disorder) 06/20/2019   Left-sided Bell's palsy 04/04/2019   Closed nondisplaced fracture of distal phalanx of right ring finger 04/03/2019   Lumbosacral disc herniation 12/14/2018   Cervical spinal stenosis 12/14/2018   Nontraumatic incomplete tear of left rotator cuff 06/01/2018   Osteoarthritis of right subtalar joint 03/01/2018   PTSD (post-traumatic stress disorder) 04/07/2017   Irritable bowel syndrome with diarrhea 04/07/2017   History of Clostridium difficile colitis 04/07/2017   History of peptic ulcer 04/07/2017   Chronic neck pain - post injury, worker's comp 2013, 2018 04/07/2017   PVC's (premature ventricular contractions) 10/11/2016   Dyslipidemia 10/11/2016    REFERRING PROVIDER: Sammuel Hines, MD  REFERRING DIAG:  S46.012A (ICD-10-CM) - Traumatic complete tear of left rotator cuff, initial encounter  S/p RCR, SAD biceps tenodesis on 11/22/21    THERAPY DIAG:  Acute pain of left shoulder  Stiffness of left shoulder, not elsewhere classified  Rationale for Evaluation and Treatment: Rehabilitation  ONSET DATE: DOS 11/22/21  Days since surgery: 51   SUBJECTIVE:  SUBJECTIVE STATEMENT: Pt does well but the shoulder is a little sore from previous HEP but no issues otherwise. Compliant with new HEP.   PERTINENT HISTORY: See problem list  PAIN:  Are you having pain? No  PRECAUTIONS: Shoulder  WEIGHT BEARING RESTRICTIONS: No  FALLS:  Has patient fallen in last 6 months? No  OCCUPATION: Not working  PLOF: Independent  PATIENT GOALS:be able to function normally, lift something up, rebuild upper body strength   OBJECTIVE:   PATIENT SURVEYS:  FOTO 40  TODAY'S TREATMENT:     Treatment                            01/12/22:  PROM shoulder  flexion and ABD to tolerance; LAD with motion Inf and post glide III L GHJ  Table flexion slide 2x5 3s Table ER 5s 10x Bent over row 2x10 1lbs Review of HEP and DOMS management      Treatment                            01/10/22:  PROM shoulder flexion and ABD to tolerance; LAD with motion STM to L UT, levator, mid trap, and infra Review of HEP and DOMS management Treatment                            01/07/22:  PROM shoulder flexion, STM to subscapularis Supine chest press x10 AAROM Seated AAROM scaption Scap retraction & AROM UE ER Bent over row & triceps kick    Treatment                           01/05/22  PROM with LAD and grad I-II oscillation at end range  Table slide flexion and ABD 2x10 3s        Cane ABD 10x Cane ER 3s 15x             Standing table biceps stretch 10s 5x             Shoulder pulley 2 mins flexion                                                                                                   Treatment                            12/10/21:  PROM per protocol STM upper trap, levator, pecs Putty grip Scapular retraction  PATIENT EDUCATION: Education details: Anatomy of condition, POC, HEP, exercise form/rationale Person educated: Patient and Spouse Education method: Consulting civil engineer, Demonstration, Tactile cues, Verbal cues, and Handouts Education comprehension: verbalized understanding, returned demonstration, verbal cues required, tactile cues required, and needs further education  HOME EXERCISE PROGRAM: [id number]  ASSESSMENT:  CLINICAL IMPRESSION: Patient able to improve left glenohumeral joint mobility and overhead range of motion following manual therapy and new active assisted range of motion exercise.  Patient able to  reach up to at least 130 with active assisted range of motion to flexion able to abduct to 100 degrees as well.  Patient is still largely stiffness dominant at this point but has good muscle activation about the deltoid and external  rotators.  Patient home exercise program updated at this time.  Plan to consider addition of light resistance and progressive active range of motion as tolerated.  Plan to continue per protocol. patient will benefit from continued skilled therapy in order to address functional left upper extremity deficits for return to prior level of function.  OBJECTIVE IMPAIRMENTS: decreased activity tolerance, decreased ROM, decreased strength, increased muscle spasms, impaired flexibility, impaired UE functional use, improper body mechanics, postural dysfunction, and pain.     REHAB POTENTIAL: Good  CLINICAL DECISION MAKING: Stable/uncomplicated  EVALUATION COMPLEXITY: Low   GOALS: Goals reviewed with patient? Yes  SHORT TERM GOALS: 4 weeks post op  Passive flexion to 140 deg without increase in pain Baseline: Goal status:achieved  2.  Passive abd to 60 deg without increase in pain Baseline:  Goal status:achieved  3.  Passive ER at side to 40 deg without increase in pain Baseline:  Goal status:achieved  4.  Able to demo proper scapular retraction for proximal shoulder girdle support Baseline:  Goal status: partially met- requires cuing during exercises   LONG TERM GOALS:   Able to demo AROM to shoulder height, against gravity, with good scapular control Baseline:  Goal status: INITIAL  Target date: 8 weeks post op: 01/17/22  2.  Will begin light resistance exercises pain <=3/10 Baseline:  Goal status: INITIAL Target date:   8 weeks post op  3.  Full AROM within 10 deg of opp UE without increase in pain Baseline:  Goal status: INITIAL Target date:   12 weeks post op: 02/14/22  4.  Proper scapular control in proprioceptive and CKC strengthening Baseline:  Goal status: INITIAL Target date:   12 weeks post op  5.  Able to lift cups/plates and other small objects into overhead cabinets without limitation by shoulder Baseline:  Goal status: INITIAL Target date:   12 weeks post  op  Further LTGs to be set at POC date PRN   PLAN:  PT FREQUENCY: 1-2x/week  PT DURATION: 12 weeks  PLANNED INTERVENTIONS: Therapeutic exercises, Therapeutic activity, Neuromuscular re-education, Balance training, Gait training, Patient/Family education, Self Care, Joint mobilization, Aquatic Therapy, Dry Needling, Electrical stimulation, Spinal mobilization, Cryotherapy, Moist heat, Taping, Traction, Ionotophoresis '4mg'$ /ml Dexamethasone, Manual therapy, and Re-evaluation  PLAN FOR NEXT SESSION: continue per protocol  Daleen Bo PT, DPT 01/12/22 10:12 AM

## 2022-01-17 ENCOUNTER — Encounter (HOSPITAL_BASED_OUTPATIENT_CLINIC_OR_DEPARTMENT_OTHER): Payer: Self-pay | Admitting: Physical Therapy

## 2022-01-17 ENCOUNTER — Ambulatory Visit (HOSPITAL_BASED_OUTPATIENT_CLINIC_OR_DEPARTMENT_OTHER): Payer: Medicare HMO | Admitting: Physical Therapy

## 2022-01-17 DIAGNOSIS — M25612 Stiffness of left shoulder, not elsewhere classified: Secondary | ICD-10-CM

## 2022-01-17 DIAGNOSIS — M25512 Pain in left shoulder: Secondary | ICD-10-CM

## 2022-01-17 NOTE — Therapy (Signed)
OUTPATIENT PHYSICAL THERAPY SHOULDER TREATMENT   Patient Name: Grace Taylor MRN: [id number] DOB:08/18/1960, 62 y.o., female Today's Date: 01/17/2022  END OF SESSION:  PT End of Session - 01/17/22 0930     Visit Number 6    Number of Visits 25    Date for PT Re-Evaluation 03/04/22    Authorization Type [employer]    PT Start Time 0930    PT Stop Time 1000    PT Time Calculation (min) 30 min    Activity Tolerance Patient tolerated treatment well    Behavior During Therapy [facility] for tasks assessed/performed               Past Medical History:  Diagnosis Date   Allergy    Anemia    Benign colon polyp 04/07/2017   Q 5 year recall   Chronic neck pain - post injury, worker's comp 2013, 2018 04/07/2017   Dr. Mardelle Matte   Clostridium difficile infection    History of peptic ulcer 04/07/2017   Hyperlipidemia    IBS (irritable bowel syndrome) 04/07/2017   Colonoscopy by Carlean Purl   Migraines    PTSD (post-traumatic stress disorder) 04/07/2017   Ulcer    age 67   Past Surgical History:  Procedure Laterality Date   CHOLECYSTECTOMY  2012   COLONOSCOPY  2014   DG GALL BLADDER     2012   ESOPHAGOGASTRODUODENOSCOPY  2014   FLEXIBLE SIGMOIDOSCOPY  03/2014   OVARIAN CYST REMOVAL     right shoulder surgery     SHOULDER ARTHROSCOPY WITH ROTATOR CUFF REPAIR AND OPEN BICEPS TENODESIS Left 11/22/2021   Procedure: LEFT SHOULDER ARTHROSCOPY WITH ROTATOR CUFF REPAIR AND BICEPS TENODESIS;  Surgeon: Vanetta Mulders, MD;  Location: [place];  Service: Orthopedics;  Laterality: Left;   TMJ ARTHROSCOPY     Patient Active Problem List   Diagnosis Date Noted   Insomnia 12/21/2021   Other spondylosis with radiculopathy, cervical region 04/21/2021   Foraminal stenosis of cervical region 04/21/2021   Elevated coronary artery calcium score 06/10/2020   Candida laryngitis 10/22/2019   Hoarseness 10/22/2019   Laryngospasms 10/22/2019   Globus sensation 10/22/2019   Lumbar spinal stenosis  07/05/2019   GAD (generalized anxiety disorder) 06/20/2019   Left-sided Bell's palsy 04/04/2019   Closed nondisplaced fracture of distal phalanx of right ring finger 04/03/2019   Lumbosacral disc herniation 12/14/2018   Cervical spinal stenosis 12/14/2018   Nontraumatic incomplete tear of left rotator cuff 06/01/2018   Osteoarthritis of right subtalar joint 03/01/2018   PTSD (post-traumatic stress disorder) 04/07/2017   Irritable bowel syndrome with diarrhea 04/07/2017   History of Clostridium difficile colitis 04/07/2017   History of peptic ulcer 04/07/2017   Chronic neck pain - post injury, worker's comp 2013, 2018 04/07/2017   PVC's (premature ventricular contractions) 10/11/2016   Dyslipidemia 10/11/2016    REFERRING PROVIDER: Sammuel Hines, MD  REFERRING DIAG:  S46.012A (ICD-10-CM) - Traumatic complete tear of left rotator cuff, initial encounter  S/p RCR, SAD biceps tenodesis on 11/22/21    THERAPY DIAG:  Acute pain of left shoulder  Stiffness of left shoulder, not elsewhere classified  Rationale for Evaluation and Treatment: Rehabilitation  ONSET DATE: DOS 11/22/21  Days since surgery: 56   SUBJECTIVE:  SUBJECTIVE STATEMENT: Pt states she is doing well. She was unable to do HEP this weekend due to time.   PERTINENT HISTORY: See problem list  PAIN:  Are you having pain? No  PRECAUTIONS: Shoulder  WEIGHT BEARING RESTRICTIONS: No  FALLS:  Has patient fallen in last 6 months? No  OCCUPATION: Not working  PLOF: Independent  PATIENT GOALS:be able to function normally, lift something up, rebuild upper body strength   OBJECTIVE:   PATIENT SURVEYS:  FOTO 40  TODAY'S TREATMENT:     Treatment                            01/17/22:  PROM shoulder flexion and ABD to tolerance; LAD  with motion Inf and post glide III L GHJ  Wall flexion slide 2x5 3s Table ER 10s 10x Table biceps stretch 30s 3x AROM flexion, scaption, and ABD (unable due to pain) 10x each  Treatment                            01/12/22:  PROM shoulder flexion and ABD to tolerance; LAD with motion Inf and post glide III L GHJ  Table flexion slide 2x5 3s Table ER 5s 10x Bent over row 2x10 1lbs Review of HEP and DOMS management      Treatment                            01/10/22:  PROM shoulder flexion and ABD to tolerance; LAD with motion STM to L UT, levator, mid trap, and infra Review of HEP and DOMS management Treatment                            01/07/22:  PROM shoulder flexion, STM to subscapularis Supine chest press x10 AAROM Seated AAROM scaption Scap retraction & AROM UE ER Bent over row & triceps kick    Treatment                           01/05/22  PROM with LAD and grad I-II oscillation at end range  Table slide flexion and ABD 2x10 3s        Cane ABD 10x Cane ER 3s 15x             Standing table biceps stretch 10s 5x             Shoulder pulley 2 mins flexion                                                                                                   Treatment                            12/10/21:  PROM per protocol STM upper trap, levator, pecs Putty grip Scapular retraction  PATIENT EDUCATION: Education details: Anatomy of condition, POC, HEP, exercise  form/rationale Person educated: Patient and Spouse Education method: Explanation, Demonstration, Tactile cues, Verbal cues, and Handouts Education comprehension: verbalized understanding, returned demonstration, verbal cues required, tactile cues required, and needs further education  HOME EXERCISE PROGRAM: [id number]  ASSESSMENT:  CLINICAL IMPRESSION: Patient session used to focus on range of motion of the left shoulder.  Patient reports decreased compliance over the weekend so session used to review previous  exercises and introduce light active range of motion.  Patient performed flexion and scaption but had pain with abduction with active range of motion but to 90 and higher.  Home exercise program not changed today and patient advised to continue with previous exercises prescribed in order to test response prior to next session.  Patient is improving with active assisted range of motion as with last session but painful with active range of motion plan to consider addition of light resistance and progressive active range of motion to HEP at next if tolerated well during session.  Plan to continue per protocol. patient will benefit from continued skilled therapy in order to address functional left upper extremity deficits for return to prior level of function.  OBJECTIVE IMPAIRMENTS: decreased activity tolerance, decreased ROM, decreased strength, increased muscle spasms, impaired flexibility, impaired UE functional use, improper body mechanics, postural dysfunction, and pain.     REHAB POTENTIAL: Good  CLINICAL DECISION MAKING: Stable/uncomplicated  EVALUATION COMPLEXITY: Low   GOALS: Goals reviewed with patient? Yes  SHORT TERM GOALS: 4 weeks post op  Passive flexion to 140 deg without increase in pain Baseline: Goal status:achieved  2.  Passive abd to 60 deg without increase in pain Baseline:  Goal status:achieved  3.  Passive ER at side to 40 deg without increase in pain Baseline:  Goal status:achieved  4.  Able to demo proper scapular retraction for proximal shoulder girdle support Baseline:  Goal status: partially met- requires cuing during exercises   LONG TERM GOALS:   Able to demo AROM to shoulder height, against gravity, with good scapular control Baseline:  Goal status: INITIAL  Target date: 8 weeks post op: 01/17/22  2.  Will begin light resistance exercises pain <=3/10 Baseline:  Goal status: INITIAL Target date:   8 weeks post op  3.  Full AROM within 10 deg of  opp UE without increase in pain Baseline:  Goal status: INITIAL Target date:   12 weeks post op: 02/14/22  4.  Proper scapular control in proprioceptive and CKC strengthening Baseline:  Goal status: INITIAL Target date:   12 weeks post op  5.  Able to lift cups/plates and other small objects into overhead cabinets without limitation by shoulder Baseline:  Goal status: INITIAL Target date:   12 weeks post op  Further LTGs to be set at POC date PRN   PLAN:  PT FREQUENCY: 1-2x/week  PT DURATION: 12 weeks  PLANNED INTERVENTIONS: Therapeutic exercises, Therapeutic activity, Neuromuscular re-education, Balance training, Gait training, Patient/Family education, Self Care, Joint mobilization, Aquatic Therapy, Dry Needling, Electrical stimulation, Spinal mobilization, Cryotherapy, Moist heat, Taping, Traction, Ionotophoresis '4mg'$ /ml Dexamethasone, Manual therapy, and Re-evaluation  PLAN FOR NEXT SESSION: continue per protocol, update HEP  Daleen Bo PT, DPT 01/17/22 10:10 AM

## 2022-01-19 ENCOUNTER — Encounter (HOSPITAL_BASED_OUTPATIENT_CLINIC_OR_DEPARTMENT_OTHER): Payer: Self-pay | Admitting: Physical Therapy

## 2022-01-19 ENCOUNTER — Ambulatory Visit (HOSPITAL_BASED_OUTPATIENT_CLINIC_OR_DEPARTMENT_OTHER): Payer: Medicare HMO | Admitting: Physical Therapy

## 2022-01-19 DIAGNOSIS — M25612 Stiffness of left shoulder, not elsewhere classified: Secondary | ICD-10-CM | POA: Diagnosis not present

## 2022-01-19 DIAGNOSIS — M25512 Pain in left shoulder: Secondary | ICD-10-CM

## 2022-01-19 NOTE — Therapy (Signed)
OUTPATIENT PHYSICAL THERAPY SHOULDER TREATMENT   Patient Name: Grace Taylor MRN: [id number] DOB:[birth year], 62 y.o., female Today's Date: 01/19/2022  END OF SESSION:  PT End of Session - 01/19/22 0954     Visit Number 7    Number of Visits 25    Date for PT Re-Evaluation 03/04/22    Authorization Type [employer]    PT Start Time [phone]    PT Stop Time 1013    PT Time Calculation (min) 40 min    Activity Tolerance Patient tolerated treatment well    Behavior During Therapy [facility] for tasks assessed/performed                Past Medical History:  Diagnosis Date   Allergy    Anemia    Benign colon polyp 04/07/2017   Q 5 year recall   Chronic neck pain - post injury, worker's comp 2013, 2018 04/07/2017   Dr. Mardelle Matte   Clostridium difficile infection    History of peptic ulcer 04/07/2017   Hyperlipidemia    IBS (irritable bowel syndrome) 04/07/2017   Colonoscopy by Carlean Purl   Migraines    PTSD (post-traumatic stress disorder) 04/07/2017   Ulcer    age 42   Past Surgical History:  Procedure Laterality Date   CHOLECYSTECTOMY  2012   COLONOSCOPY  2014   DG GALL BLADDER     2012   ESOPHAGOGASTRODUODENOSCOPY  2014   FLEXIBLE SIGMOIDOSCOPY  03/2014   OVARIAN CYST REMOVAL     right shoulder surgery     SHOULDER ARTHROSCOPY WITH ROTATOR CUFF REPAIR AND OPEN BICEPS TENODESIS Left 11/22/2021   Procedure: LEFT SHOULDER ARTHROSCOPY WITH ROTATOR CUFF REPAIR AND BICEPS TENODESIS;  Surgeon: Vanetta Mulders, MD;  Location: [place];  Service: Orthopedics;  Laterality: Left;   TMJ ARTHROSCOPY     Patient Active Problem List   Diagnosis Date Noted   Insomnia 12/21/2021   Other spondylosis with radiculopathy, cervical region 04/21/2021   Foraminal stenosis of cervical region 04/21/2021   Elevated coronary artery calcium score 06/10/2020   Candida laryngitis 10/22/2019   Hoarseness 10/22/2019   Laryngospasms 10/22/2019   Globus sensation 10/22/2019   Lumbar spinal stenosis  07/05/2019   GAD (generalized anxiety disorder) 06/20/2019   Left-sided Bell's palsy 04/04/2019   Closed nondisplaced fracture of distal phalanx of right ring finger 04/03/2019   Lumbosacral disc herniation 12/14/2018   Cervical spinal stenosis 12/14/2018   Nontraumatic incomplete tear of left rotator cuff 06/01/2018   Osteoarthritis of right subtalar joint 03/01/2018   PTSD (post-traumatic stress disorder) 04/07/2017   Irritable bowel syndrome with diarrhea 04/07/2017   History of Clostridium difficile colitis 04/07/2017   History of peptic ulcer 04/07/2017   Chronic neck pain - post injury, worker's comp 2013, 2018 04/07/2017   PVC's (premature ventricular contractions) 10/11/2016   Dyslipidemia 10/11/2016    REFERRING PROVIDER: Sammuel Hines, MD  REFERRING DIAG:  S46.012A (ICD-10-CM) - Traumatic complete tear of left rotator cuff, initial encounter  S/p RCR, SAD biceps tenodesis on 11/22/21    THERAPY DIAG:  Acute pain of left shoulder  Stiffness of left shoulder, not elsewhere classified  Rationale for Evaluation and Treatment: Rehabilitation  ONSET DATE: DOS 11/22/21  Days since surgery: 58   SUBJECTIVE:  SUBJECTIVE STATEMENT: Pt states she was not sore after last session. She was able to do more HEP yesterday with AAROM.  PERTINENT HISTORY: See problem list  PAIN:  Are you having pain? No  PRECAUTIONS: Shoulder  WEIGHT BEARING RESTRICTIONS: No  FALLS:  Has patient fallen in last 6 months? No  OCCUPATION: Not working  PLOF: Independent  PATIENT GOALS:be able to function normally, lift something up, rebuild upper body strength   OBJECTIVE:   PATIENT SURVEYS:  FOTO 40  TODAY'S TREATMENT:     Treatment                            01/19/22:  PROM shoulder flexion and ABD to  tolerance; LAD with motion Inf and post glide III L GHJ  Wall flexion slide 2x5 3s Supine flexion 3x5 AROM S/L ER and ABD 2x12 Standing AROM flexion, scaption, and ABD (unable due to pain) 10x each YTB row 3x10  Treatment                            01/17/22:  PROM shoulder flexion and ABD to tolerance; LAD with motion Inf and post glide III L GHJ  Wall flexion slide 2x5 3s Table ER 10s 10x Table biceps stretch 30s 3x AROM flexion, scaption, and ABD (unable due to pain) 10x each  Treatment                            01/12/22:  PROM shoulder flexion and ABD to tolerance; LAD with motion Inf and post glide III L GHJ  Table flexion slide 2x5 3s Table ER 5s 10x Bent over row 2x10 1lbs Review of HEP and DOMS management      Treatment                            01/10/22:  PROM shoulder flexion and ABD to tolerance; LAD with motion STM to L UT, levator, mid trap, and infra Review of HEP and DOMS management Treatment                            01/07/22:  PROM shoulder flexion, STM to subscapularis Supine chest press x10 AAROM Seated AAROM scaption Scap retraction & AROM UE ER Bent over row & triceps kick    Treatment                           01/05/22  PROM with LAD and grad I-II oscillation at end range  Table slide flexion and ABD 2x10 3s        Cane ABD 10x Cane ER 3s 15x             Standing table biceps stretch 10s 5x             Shoulder pulley 2 mins flexion  Treatment                            12/10/21:  PROM per protocol STM upper trap, levator, pecs Putty grip Scapular retraction  PATIENT EDUCATION: Education details: Anatomy of condition, POC, HEP, exercise form/rationale Person educated: Patient and Spouse Education method: Explanation, Demonstration, Tactile cues, Verbal cues, and Handouts Education comprehension: verbalized understanding, returned  demonstration, verbal cues required, tactile cues required, and needs further education  HOME EXERCISE PROGRAM: [id number]  ASSESSMENT:  CLINICAL IMPRESSION: Pt with good tolerance to progression of exercise to AROM and antigravity motion today. Pain was reduced today with lifting and pt had improved OH flexibility and mobility. No complaints during session and HEP updated accordingly. Pt progressing well now she is able to reduce  shoulder stiffness. Encouraged to continue with HEP consistently at home. patient will benefit from continued skilled therapy in order to address functional left upper extremity deficits for return to prior level of function.  OBJECTIVE IMPAIRMENTS: decreased activity tolerance, decreased ROM, decreased strength, increased muscle spasms, impaired flexibility, impaired UE functional use, improper body mechanics, postural dysfunction, and pain.     REHAB POTENTIAL: Good  CLINICAL DECISION MAKING: Stable/uncomplicated  EVALUATION COMPLEXITY: Low   GOALS: Goals reviewed with patient? Yes  SHORT TERM GOALS: 4 weeks post op  Passive flexion to 140 deg without increase in pain Baseline: Goal status:achieved  2.  Passive abd to 60 deg without increase in pain Baseline:  Goal status:achieved  3.  Passive ER at side to 40 deg without increase in pain Baseline:  Goal status:achieved  4.  Able to demo proper scapular retraction for proximal shoulder girdle support Baseline:  Goal status: partially met- requires cuing during exercises   LONG TERM GOALS:   Able to demo AROM to shoulder height, against gravity, with good scapular control Baseline:  Goal status: INITIAL  Target date: 8 weeks post op: 01/17/22  2.  Will begin light resistance exercises pain <=3/10 Baseline:  Goal status: INITIAL Target date:   8 weeks post op  3.  Full AROM within 10 deg of opp UE without increase in pain Baseline:  Goal status: INITIAL Target date:   12 weeks post  op: 02/14/22  4.  Proper scapular control in proprioceptive and CKC strengthening Baseline:  Goal status: INITIAL Target date:   12 weeks post op  5.  Able to lift cups/plates and other small objects into overhead cabinets without limitation by shoulder Baseline:  Goal status: INITIAL Target date:   12 weeks post op  Further LTGs to be set at POC date PRN   PLAN:  PT FREQUENCY: 1-2x/week  PT DURATION: 12 weeks  PLANNED INTERVENTIONS: Therapeutic exercises, Therapeutic activity, Neuromuscular re-education, Balance training, Gait training, Patient/Family education, Self Care, Joint mobilization, Aquatic Therapy, Dry Needling, Electrical stimulation, Spinal mobilization, Cryotherapy, Moist heat, Taping, Traction, Ionotophoresis '4mg'$ /ml Dexamethasone, Manual therapy, and Re-evaluation  PLAN FOR NEXT SESSION: continue per protocol, update HEP  Daleen Bo PT, DPT 01/19/22 10:15 AM

## 2022-01-24 ENCOUNTER — Ambulatory Visit (HOSPITAL_BASED_OUTPATIENT_CLINIC_OR_DEPARTMENT_OTHER): Payer: Medicare HMO | Admitting: Physical Therapy

## 2022-01-24 ENCOUNTER — Encounter (HOSPITAL_BASED_OUTPATIENT_CLINIC_OR_DEPARTMENT_OTHER): Payer: Self-pay | Admitting: Physical Therapy

## 2022-01-24 DIAGNOSIS — M25612 Stiffness of left shoulder, not elsewhere classified: Secondary | ICD-10-CM | POA: Diagnosis not present

## 2022-01-24 DIAGNOSIS — M25512 Pain in left shoulder: Secondary | ICD-10-CM | POA: Diagnosis not present

## 2022-01-24 NOTE — Therapy (Signed)
OUTPATIENT PHYSICAL THERAPY SHOULDER TREATMENT   Patient Name: Grace Taylor MRN: [id number] DOB:04-30-1960, 62 y.o., female Today's Date: 01/24/2022  END OF SESSION:  PT End of Session - 01/24/22 0944     Visit Number 8    Number of Visits 25    Date for PT Re-Evaluation 03/04/22    Authorization Type [employer]    PT Start Time [phone]    PT Stop Time 1012    PT Time Calculation (min) 35 min    Activity Tolerance Patient tolerated treatment well    Behavior During Therapy [facility] for tasks assessed/performed                 Past Medical History:  Diagnosis Date   Allergy    Anemia    Benign colon polyp 04/07/2017   Q 5 year recall   Chronic neck pain - post injury, worker's comp 2013, 2018 04/07/2017   Dr. Mardelle Matte   Clostridium difficile infection    History of peptic ulcer 04/07/2017   Hyperlipidemia    IBS (irritable bowel syndrome) 04/07/2017   Colonoscopy by Carlean Purl   Migraines    PTSD (post-traumatic stress disorder) 04/07/2017   Ulcer    age 42   Past Surgical History:  Procedure Laterality Date   CHOLECYSTECTOMY  2012   COLONOSCOPY  2014   DG GALL BLADDER     2012   ESOPHAGOGASTRODUODENOSCOPY  2014   FLEXIBLE SIGMOIDOSCOPY  03/2014   OVARIAN CYST REMOVAL     right shoulder surgery     SHOULDER ARTHROSCOPY WITH ROTATOR CUFF REPAIR AND OPEN BICEPS TENODESIS Left 11/22/2021   Procedure: LEFT SHOULDER ARTHROSCOPY WITH ROTATOR CUFF REPAIR AND BICEPS TENODESIS;  Surgeon: Vanetta Mulders, MD;  Location: [place];  Service: Orthopedics;  Laterality: Left;   TMJ ARTHROSCOPY     Patient Active Problem List   Diagnosis Date Noted   Insomnia 12/21/2021   Other spondylosis with radiculopathy, cervical region 04/21/2021   Foraminal stenosis of cervical region 04/21/2021   Elevated coronary artery calcium score 06/10/2020   Candida laryngitis 10/22/2019   Hoarseness 10/22/2019   Laryngospasms 10/22/2019   Globus sensation 10/22/2019   Lumbar spinal stenosis  07/05/2019   GAD (generalized anxiety disorder) 06/20/2019   Left-sided Bell's palsy 04/04/2019   Closed nondisplaced fracture of distal phalanx of right ring finger 04/03/2019   Lumbosacral disc herniation 12/14/2018   Cervical spinal stenosis 12/14/2018   Nontraumatic incomplete tear of left rotator cuff 06/01/2018   Osteoarthritis of right subtalar joint 03/01/2018   PTSD (post-traumatic stress disorder) 04/07/2017   Irritable bowel syndrome with diarrhea 04/07/2017   History of Clostridium difficile colitis 04/07/2017   History of peptic ulcer 04/07/2017   Chronic neck pain - post injury, worker's comp 2013, 2018 04/07/2017   PVC's (premature ventricular contractions) 10/11/2016   Dyslipidemia 10/11/2016    REFERRING PROVIDER: Sammuel Hines, MD  REFERRING DIAG:  S46.012A (ICD-10-CM) - Traumatic complete tear of left rotator cuff, initial encounter  S/p RCR, SAD biceps tenodesis on 11/22/21    THERAPY DIAG:  Acute pain of left shoulder  Stiffness of left shoulder, not elsewhere classified  Rationale for Evaluation and Treatment: Rehabilitation  ONSET DATE: DOS 11/22/21  Days since surgery: 63   SUBJECTIVE:  SUBJECTIVE STATEMENT:  Pt states her ROM is improving. She was not excessively sore after last session.  PERTINENT HISTORY: See problem list  PAIN:  Are you having pain? No  PRECAUTIONS: Shoulder  WEIGHT BEARING RESTRICTIONS: No  FALLS:  Has patient fallen in last 6 months? No  OCCUPATION: Not working  PLOF: Independent  PATIENT GOALS:be able to function normally, lift something up, rebuild upper body strength   OBJECTIVE:   PATIENT SURVEYS:  FOTO 40 FOTO 35 pts 1/22  TODAY'S TREATMENT:     Treatment                            01/24/22:  PROM shoulder flexion and ABD  to tolerance; LAD with motion Inf and post glide III L GHJ  Wall flexion slide 2x5 3s Supine flexion 2x8 AROM (pain on second set) Supine press (stopped due to pain after supine flexion) S/L ER and ABD 2x10 Standing AROM scaption, 10x each Pulley's flexion and ABD 2 min each  Treatment                            01/19/22:  PROM shoulder flexion and ABD to tolerance; LAD with motion Inf and post glide III L GHJ  Wall flexion slide 2x5 3s Supine flexion 3x5 AROM S/L ER and ABD 2x12 Standing AROM flexion, scaption, and ABD (unable due to pain) 10x each YTB row 3x10  Treatment                            01/17/22:  PROM shoulder flexion and ABD to tolerance; LAD with motion Inf and post glide III L GHJ  Wall flexion slide 2x5 3s Table ER 10s 10x Table biceps stretch 30s 3x AROM flexion, scaption, and ABD (unable due to pain) 10x each  Treatment                            01/12/22:  PROM shoulder flexion and ABD to tolerance; LAD with motion Inf and post glide III L GHJ  Table flexion slide 2x5 3s Table ER 5s 10x Bent over row 2x10 1lbs Review of HEP and DOMS management      Treatment                            01/10/22:  PROM shoulder flexion and ABD to tolerance; LAD with motion STM to L UT, levator, mid trap, and infra Review of HEP and DOMS management Treatment                            01/07/22:  PROM shoulder flexion, STM to subscapularis Supine chest press x10 AAROM Seated AAROM scaption Scap retraction & AROM UE ER Bent over row & triceps kick    Treatment                           01/05/22  PROM with LAD and grad I-II oscillation at end range  Table slide flexion and ABD 2x10 3s        Cane ABD 10x Cane ER 3s 15x             Standing table biceps stretch 10s 5x  Shoulder pulley 2 mins flexion                                                                                                   Treatment                            12/10/21:  PROM per  protocol STM upper trap, levator, pecs Putty grip Scapular retraction  PATIENT EDUCATION: Education details: Anatomy of condition, POC, HEP, exercise form/rationale Person educated: Patient and Spouse Education method: Explanation, Demonstration, Tactile cues, Verbal cues, and Handouts Education comprehension: verbalized understanding, returned demonstration, verbal cues required, tactile cues required, and needs further education  HOME EXERCISE PROGRAM: [id number]  ASSESSMENT:  CLINICAL IMPRESSION: Pt with pain during AROM flexion long lever position which likely due to weakness. Pt reports mixed compliance with HEP. HEP fully reviewed and pt encouraged to work on A/AROM in all planes to improve OH reaching ability. Pt was able to perfrom ABD and scpation to day with greater ease and no pain. HEP streamlined. Plan to continue with protocol as tolerated. Pt is 9 wks at this time. patient will benefit from continued skilled therapy in order to address functional left upper extremity deficits for return to prior level of function.  OBJECTIVE IMPAIRMENTS: decreased activity tolerance, decreased ROM, decreased strength, increased muscle spasms, impaired flexibility, impaired UE functional use, improper body mechanics, postural dysfunction, and pain.     REHAB POTENTIAL: Good  CLINICAL DECISION MAKING: Stable/uncomplicated  EVALUATION COMPLEXITY: Low   GOALS: Goals reviewed with patient? Yes  SHORT TERM GOALS: 4 weeks post op  Passive flexion to 140 deg without increase in pain Baseline: Goal status:achieved  2.  Passive abd to 60 deg without increase in pain Baseline:  Goal status:achieved  3.  Passive ER at side to 40 deg without increase in pain Baseline:  Goal status:achieved  4.  Able to demo proper scapular retraction for proximal shoulder girdle support Baseline:  Goal status: partially met- requires cuing during exercises   LONG TERM GOALS:   Able to demo AROM  to shoulder height, against gravity, with good scapular control Baseline:  Goal status: INITIAL  Target date: 8 weeks post op: 01/17/22  2.  Will begin light resistance exercises pain <=3/10 Baseline:  Goal status: INITIAL Target date:   8 weeks post op  3.  Full AROM within 10 deg of opp UE without increase in pain Baseline:  Goal status: INITIAL Target date:   12 weeks post op: 02/14/22  4.  Proper scapular control in proprioceptive and CKC strengthening Baseline:  Goal status: INITIAL Target date:   12 weeks post op  5.  Able to lift cups/plates and other small objects into overhead cabinets without limitation by shoulder Baseline:  Goal status: INITIAL Target date:   12 weeks post op  Further LTGs to be set at POC date PRN   PLAN:  PT FREQUENCY: 1-2x/week  PT DURATION: 12 weeks  PLANNED INTERVENTIONS: Therapeutic exercises, Therapeutic activity, Neuromuscular re-education, Balance training, Gait training, Patient/Family education, Self  Care, Joint mobilization, Aquatic Therapy, Dry Needling, Electrical stimulation, Spinal mobilization, Cryotherapy, Moist heat, Taping, Traction, Ionotophoresis '4mg'$ /ml Dexamethasone, Manual therapy, and Re-evaluation  PLAN FOR NEXT SESSION: continue per protocol, update HEP  Daleen Bo PT, DPT 01/24/22 10:17 AM

## 2022-01-26 ENCOUNTER — Encounter (HOSPITAL_BASED_OUTPATIENT_CLINIC_OR_DEPARTMENT_OTHER): Payer: Self-pay | Admitting: Physical Therapy

## 2022-01-26 ENCOUNTER — Ambulatory Visit (HOSPITAL_BASED_OUTPATIENT_CLINIC_OR_DEPARTMENT_OTHER): Payer: Medicare HMO | Admitting: Physical Therapy

## 2022-01-26 DIAGNOSIS — M25512 Pain in left shoulder: Secondary | ICD-10-CM | POA: Diagnosis not present

## 2022-01-26 DIAGNOSIS — M25612 Stiffness of left shoulder, not elsewhere classified: Secondary | ICD-10-CM | POA: Diagnosis not present

## 2022-01-26 NOTE — Therapy (Addendum)
OUTPATIENT PHYSICAL THERAPY SHOULDER TREATMENT/Discharge   Patient Name: Grace Taylor MRN: [id number] DOB:March 13, 1960, 62 y.o., female Today's Date: 01/26/2022  END OF SESSION:  PT End of Session - 01/26/22 0933     Visit Number 9    Number of Visits 25    Date for PT Re-Evaluation 03/04/22    Authorization Type [employer]    PT Start Time [phone]    PT Stop Time 1011    PT Time Calculation (min) 40 min    Activity Tolerance Patient tolerated treatment well    Behavior During Therapy [facility] for tasks assessed/performed                 Past Medical History:  Diagnosis Date   Allergy    Anemia    Benign colon polyp 04/07/2017   Q 5 year recall   Chronic neck pain - post injury, worker's comp 2013, 2018 04/07/2017   Dr. Mardelle Matte   Clostridium difficile infection    History of peptic ulcer 04/07/2017   Hyperlipidemia    IBS (irritable bowel syndrome) 04/07/2017   Colonoscopy by Carlean Purl   Migraines    PTSD (post-traumatic stress disorder) 04/07/2017   Ulcer    age 43   Past Surgical History:  Procedure Laterality Date   CHOLECYSTECTOMY  2012   COLONOSCOPY  2014   DG GALL BLADDER     2012   ESOPHAGOGASTRODUODENOSCOPY  2014   FLEXIBLE SIGMOIDOSCOPY  03/2014   OVARIAN CYST REMOVAL     right shoulder surgery     SHOULDER ARTHROSCOPY WITH ROTATOR CUFF REPAIR AND OPEN BICEPS TENODESIS Left 11/22/2021   Procedure: LEFT SHOULDER ARTHROSCOPY WITH ROTATOR CUFF REPAIR AND BICEPS TENODESIS;  Surgeon: Vanetta Mulders, MD;  Location: [place];  Service: Orthopedics;  Laterality: Left;   TMJ ARTHROSCOPY     Patient Active Problem List   Diagnosis Date Noted   Insomnia 12/21/2021   Other spondylosis with radiculopathy, cervical region 04/21/2021   Foraminal stenosis of cervical region 04/21/2021   Elevated coronary artery calcium score 06/10/2020   Candida laryngitis 10/22/2019   Hoarseness 10/22/2019   Laryngospasms 10/22/2019   Globus sensation 10/22/2019   Lumbar  spinal stenosis 07/05/2019   GAD (generalized anxiety disorder) 06/20/2019   Left-sided Bell's palsy 04/04/2019   Closed nondisplaced fracture of distal phalanx of right ring finger 04/03/2019   Lumbosacral disc herniation 12/14/2018   Cervical spinal stenosis 12/14/2018   Nontraumatic incomplete tear of left rotator cuff 06/01/2018   Osteoarthritis of right subtalar joint 03/01/2018   PTSD (post-traumatic stress disorder) 04/07/2017   Irritable bowel syndrome with diarrhea 04/07/2017   History of Clostridium difficile colitis 04/07/2017   History of peptic ulcer 04/07/2017   Chronic neck pain - post injury, worker's comp 2013, 2018 04/07/2017   PVC's (premature ventricular contractions) 10/11/2016   Dyslipidemia 10/11/2016    REFERRING PROVIDER: Sammuel Hines, MD  REFERRING DIAG:  S46.012A (ICD-10-CM) - Traumatic complete tear of left rotator cuff, initial encounter  S/p RCR, SAD biceps tenodesis on 11/22/21    THERAPY DIAG:  Acute pain of left shoulder  Stiffness of left shoulder, not elsewhere classified  Rationale for Evaluation and Treatment: Rehabilitation  ONSET DATE: DOS 11/22/21  Days since surgery: 65   SUBJECTIVE:  SUBJECTIVE STATEMENT:  Pt states the wall slide is her hardest exercise. She reports she is not doing the home exercise as she should.  PERTINENT HISTORY: See problem list  PAIN:  Are you having pain? No  PRECAUTIONS: Shoulder  WEIGHT BEARING RESTRICTIONS: No  FALLS:  Has patient fallen in last 6 months? No  OCCUPATION: Not working  PLOF: Independent  PATIENT GOALS:be able to function normally, lift something up, rebuild upper body strength   OBJECTIVE:   PATIENT SURVEYS:  FOTO 40 FOTO 35 pts 1/22  TODAY'S TREATMENT:   Treatment                             01/26/22:   Wall flexion and scpation finger ladder 2x5 3s Bent over row 2lbs 3x8 Wall pec stretch low 30s 3x S/L ER and ABD 3x8 each Standing AROM scaption, 10x each RTB row and ext 3x8 each  Pulley's flexion and ABD 56mn each    Treatment                            01/24/22:  PROM shoulder flexion and ABD to tolerance; LAD with motion Inf and post glide III L GHJ  Wall flexion slide 2x5 3s Supine flexion 2x8 AROM (pain on second set) Supine press (stopped due to pain after supine flexion) S/L ER and ABD 2x10 Standing AROM scaption, 10x each Pulley's flexion and ABD 2 min each  Treatment                            01/19/22:  PROM shoulder flexion and ABD to tolerance; LAD with motion Inf and post glide III L GHJ  Wall flexion slide 2x5 3s Supine flexion 3x5 AROM S/L ER and ABD 2x12 Standing AROM flexion, scaption, and ABD (unable due to pain) 10x each YTB row 3x10  Treatment                            01/17/22:  PROM shoulder flexion and ABD to tolerance; LAD with motion Inf and post glide III L GHJ  Wall flexion slide 2x5 3s Table ER 10s 10x Table biceps stretch 30s 3x AROM flexion, scaption, and ABD (unable due to pain) 10x each  Treatment                            01/12/22:  PROM shoulder flexion and ABD to tolerance; LAD with motion Inf and post glide III L GHJ  Table flexion slide 2x5 3s Table ER 5s 10x Bent over row 2x10 1lbs Review of HEP and DOMS management      Treatment                            01/10/22:  PROM shoulder flexion and ABD to tolerance; LAD with motion STM to L UT, levator, mid trap, and infra Review of HEP and DOMS management Treatment                            01/07/22:  PROM shoulder flexion, STM to subscapularis Supine chest press x10 AAROM Seated AAROM scaption Scap retraction & AROM UE ER Bent over row & triceps kick  Treatment                           01/05/22  PROM with LAD and grad I-II oscillation at end  range  Table slide flexion and ABD 2x10 3s        Cane ABD 10x Cane ER 3s 15x             Standing table biceps stretch 10s 5x             Shoulder pulley 2 mins flexion                                                                                                   Treatment                            12/10/21:  PROM per protocol STM upper trap, levator, pecs Putty grip Scapular retraction  PATIENT EDUCATION: Education details: Anatomy of condition, POC, HEP, exercise form/rationale Person educated: Patient and Spouse Education method: Explanation, Demonstration, Tactile cues, Verbal cues, and Handouts Education comprehension: verbalized understanding, returned demonstration, verbal cues required, tactile cues required, and needs further education  HOME EXERCISE PROGRAM: [id number]  ASSESSMENT:  CLINICAL IMPRESSION: Pt HEP progressed today to perform scapular strengthening of the shoulder without issues. Pt has the most discomfort with anti-gravity ROM. Pt advised to continue with stretching in order to improve range and continually working on AROM with the assistance of heat in order to facilitate improvement in soft tissue and joint stiffness. patient will benefit from continued skilled therapy in order to address functional left upper extremity deficits for return to prior level of function.  OBJECTIVE IMPAIRMENTS: decreased activity tolerance, decreased ROM, decreased strength, increased muscle spasms, impaired flexibility, impaired UE functional use, improper body mechanics, postural dysfunction, and pain.     REHAB POTENTIAL: Good  CLINICAL DECISION MAKING: Stable/uncomplicated  EVALUATION COMPLEXITY: Low   GOALS: Goals reviewed with patient? Yes  SHORT TERM GOALS: 4 weeks post op  Passive flexion to 140 deg without increase in pain Baseline: Goal status:achieved  2.  Passive abd to 60 deg without increase in pain Baseline:  Goal status:achieved  3.  Passive  ER at side to 40 deg without increase in pain Baseline:  Goal status:achieved  4.  Able to demo proper scapular retraction for proximal shoulder girdle support Baseline:  Goal status: partially met- requires cuing during exercises   LONG TERM GOALS:   Able to demo AROM to shoulder height, against gravity, with good scapular control Baseline:  Goal status: INITIAL  Target date: 8 weeks post op: 01/17/22  2.  Will begin light resistance exercises pain <=3/10 Baseline:  Goal status: INITIAL Target date:   8 weeks post op  3.  Full AROM within 10 deg of opp UE without increase in pain Baseline:  Goal status: INITIAL Target date:   12 weeks post op: 02/14/22  4.  Proper scapular control in proprioceptive and CKC strengthening Baseline:  Goal status: INITIAL Target date:  12 weeks post op  5.  Able to lift cups/plates and other small objects into overhead cabinets without limitation by shoulder Baseline:  Goal status: INITIAL Target date:   12 weeks post op  Further LTGs to be set at POC date PRN   PLAN:  PT FREQUENCY: 1-2x/week  PT DURATION: 12 weeks  PLANNED INTERVENTIONS: Therapeutic exercises, Therapeutic activity, Neuromuscular re-education, Balance training, Gait training, Patient/Family education, Self Care, Joint mobilization, Aquatic Therapy, Dry Needling, Electrical stimulation, Spinal mobilization, Cryotherapy, Moist heat, Taping, Traction, Ionotophoresis '4mg'$ /ml Dexamethasone, Manual therapy, and Re-evaluation  PLAN FOR NEXT SESSION: continue per protocol, update HEP  Daleen Bo PT, DPT 01/26/22 10:16 AM  PHYSICAL THERAPY DISCHARGE SUMMARY  Visits from Start of Care: 9  Current functional level related to goals / functional outcomes: See above   Remaining deficits: See above   Education / Equipment: Anatomy of condition, POC, HEP, exercise form/rationale    Patient agrees to discharge. Patient goals were partially met. Patient is being discharged  due to  pt choosing to be seen elsewhere for more appropriate time appointments.  Jessica C. Hightower PT, DPT 01/28/22 8:23 AM

## 2022-01-28 ENCOUNTER — Encounter (HOSPITAL_BASED_OUTPATIENT_CLINIC_OR_DEPARTMENT_OTHER): Payer: Self-pay | Admitting: Orthopaedic Surgery

## 2022-01-28 ENCOUNTER — Other Ambulatory Visit (HOSPITAL_BASED_OUTPATIENT_CLINIC_OR_DEPARTMENT_OTHER): Payer: Self-pay | Admitting: Orthopaedic Surgery

## 2022-01-28 ENCOUNTER — Telehealth: Payer: Self-pay | Admitting: Orthopaedic Surgery

## 2022-01-28 DIAGNOSIS — S46012A Strain of muscle(s) and tendon(s) of the rotator cuff of left shoulder, initial encounter: Secondary | ICD-10-CM

## 2022-01-28 NOTE — Telephone Encounter (Signed)
Referral has been placed to [facility]. PT management has been made aware. Per Sharee Pimple, patient is not to be seen in [facility] facility.

## 2022-01-28 NOTE — Telephone Encounter (Signed)
Patient states she needs a referral note for P/T she states she is having issues with P/T getting appointments after 9am and she is unable to come in that early. Please advise..[phone]. She wants to go to another P/T location.patient is very upset that she can't get an appointment later. She is unable to come early. She has been unable to get P/T due to this issue, after surgery. She wants to speak to the manager.

## 2022-01-31 ENCOUNTER — Ambulatory Visit (HOSPITAL_BASED_OUTPATIENT_CLINIC_OR_DEPARTMENT_OTHER): Payer: Medicare HMO | Admitting: Physical Therapy

## 2022-02-02 ENCOUNTER — Ambulatory Visit (HOSPITAL_BASED_OUTPATIENT_CLINIC_OR_DEPARTMENT_OTHER): Payer: Medicare HMO | Admitting: Physical Therapy

## 2022-02-04 ENCOUNTER — Other Ambulatory Visit (HOSPITAL_BASED_OUTPATIENT_CLINIC_OR_DEPARTMENT_OTHER): Payer: Self-pay | Admitting: Orthopaedic Surgery

## 2022-02-04 DIAGNOSIS — S46012A Strain of muscle(s) and tendon(s) of the rotator cuff of left shoulder, initial encounter: Secondary | ICD-10-CM

## 2022-02-07 ENCOUNTER — Encounter (HOSPITAL_BASED_OUTPATIENT_CLINIC_OR_DEPARTMENT_OTHER): Payer: Medicare HMO | Admitting: Physical Therapy

## 2022-02-09 ENCOUNTER — Encounter (HOSPITAL_BASED_OUTPATIENT_CLINIC_OR_DEPARTMENT_OTHER): Payer: Medicare HMO | Admitting: Physical Therapy

## 2022-02-14 ENCOUNTER — Encounter (HOSPITAL_BASED_OUTPATIENT_CLINIC_OR_DEPARTMENT_OTHER): Payer: Medicare HMO | Admitting: Physical Therapy

## 2022-02-16 ENCOUNTER — Ambulatory Visit (INDEPENDENT_AMBULATORY_CARE_PROVIDER_SITE_OTHER): Payer: Medicare HMO | Admitting: Orthopaedic Surgery

## 2022-02-16 ENCOUNTER — Encounter (HOSPITAL_BASED_OUTPATIENT_CLINIC_OR_DEPARTMENT_OTHER): Payer: Medicare HMO | Admitting: Physical Therapy

## 2022-02-16 DIAGNOSIS — S46012A Strain of muscle(s) and tendon(s) of the rotator cuff of left shoulder, initial encounter: Secondary | ICD-10-CM

## 2022-02-16 NOTE — Progress Notes (Signed)
Post Operative Evaluation    Procedure/Date of Surgery: Left shoulder rotator cuff repair with collagen patch augmentation and biceps tenodesis 11/20  Interval History:    Presents today status post left shoulder rotator cuff repair overall doing very well.  She has been working on active range of motion with her physical therapist.  At this time she just has some soreness about the anterior glenohumeral joint with range of motion although this is overall mild.   PMH/PSH/Family History/Social History/Meds/Allergies:    Past Medical History:  Diagnosis Date   Allergy    Anemia    Benign colon polyp 04/07/2017   Q 5 year recall   Chronic neck pain - post injury, worker's comp 2013, 2018 04/07/2017   Dr. Mardelle Matte   Clostridium difficile infection    History of peptic ulcer 04/07/2017   Hyperlipidemia    IBS (irritable bowel syndrome) 04/07/2017   Colonoscopy by Carlean Purl   Migraines    PTSD (post-traumatic stress disorder) 04/07/2017   Ulcer    age 62   Past Surgical History:  Procedure Laterality Date   CHOLECYSTECTOMY  2012   COLONOSCOPY  2014   DG GALL BLADDER     2012   ESOPHAGOGASTRODUODENOSCOPY  2014   FLEXIBLE SIGMOIDOSCOPY  03/2014   OVARIAN CYST REMOVAL     right shoulder surgery     SHOULDER ARTHROSCOPY WITH ROTATOR CUFF REPAIR AND OPEN BICEPS TENODESIS Left 11/22/2021   Procedure: LEFT SHOULDER ARTHROSCOPY WITH ROTATOR CUFF REPAIR AND BICEPS TENODESIS;  Surgeon: Vanetta Mulders, MD;  Location: [place];  Service: Orthopedics;  Laterality: Left;   TMJ ARTHROSCOPY     Social History   Socioeconomic History   Marital status: Divorced    Spouse name: Not on file   Number of children: 0   Years of education: some college   Highest education level: Not on file  Occupational History   Occupation: former Control and instrumentation engineer   Occupation: disabled  Tobacco Use   Smoking status: Never   Smokeless tobacco: Never  Vaping Use   Vaping Use:  Never used  Substance and Sexual Activity   Alcohol use: Not Currently   Drug use: No   Sexual activity: Yes    Birth control/protection: Condom  Other Topics Concern   Not on file  Social History Narrative   Worked at the [employer], on SSD or SSI - but no health insurance   Social Determinants of Health   Financial Resource Strain: Medium Risk (04/07/2017)   Overall Financial Resource Strain (CARDIA)    Difficulty of Paying Living Expenses: Somewhat hard  Food Insecurity: Food Insecurity Present (04/07/2017)   Hunger Vital Sign    Worried About Running Out of Food in the Last Year: Sometimes true    Ran Out of Food in the Last Year: Not on file  Transportation Needs: Not on file  Physical Activity: Not on file  Stress: Stress Concern Present (04/07/2017)   Capac    Feeling of Stress : Very much  Social Connections: Not on file   Family History  Problem Relation Age of Onset   Hypertension Mother    Alcohol abuse Mother    Arthritis Mother    Cancer Mother    Heart disease Mother    Kidney  disease Mother    Mental illness Mother    Hypertension Father    CAD Father 54       CABG    Hyperlipidemia Father    Heart disease Father    Early death Father    Heart attack Father    Hypertension Brother    Alcohol abuse Brother    Heart disease Brother    Breast cancer Maternal Aunt    Cancer Maternal Aunt        breast cancer   Hypertension Sister    Hyperlipidemia Sister    Arthritis Sister    Allergies  Allergen Reactions   Claritin [Loratadine]     Affects eyes    Codeine     Hyperactivity    Crestor [Rosuvastatin]     INEFFECTIVE AT LOWERING LDL   Lipitor [Atorvastatin]     INEFFECTIVE AT LOWERING LDL   Penicillins Hives   Simvastatin     ELEVATED LIVER ENZYMES   Sulfonamide Derivatives Nausea And Vomiting and Rash   Current Outpatient Medications  Medication Sig Dispense Refill    Alirocumab (PRALUENT) 150 MG/ML SOAJ INJECT 150 MG INTO THE SKIN EVERY 14 DAYS. 6 mL 0   Ascorbic Acid (VITAMIN C) 1000 MG tablet Take 1,000 mg by mouth daily.     aspirin EC 325 MG tablet Take 1 tablet (325 mg total) by mouth daily. 30 tablet 0   Calcium-Magnesium-Zinc (CAL-MAG-ZINC PO) Take 1 tablet by mouth daily.     Carboxymethylcellulose Sodium (THERATEARS OP) Place 1 drop into both eyes daily as needed (irritation).     clonazePAM (KLONOPIN) 0.5 MG tablet Take 0.5-1 tablets by mouth See admin instructions. Take 0.5 mg in the morning and 1 mg in the evening  1   ezetimibe (ZETIA) 10 MG tablet Take 1 tablet (10 mg total) by mouth daily. 90 tablet 3   famotidine (PEPCID) 40 MG tablet Take 1 tablet (40 mg total) by mouth every evening. (Patient taking differently: Take 40 mg by mouth daily as needed for heartburn.) 90 tablet 1   Ferrous Sulfate (IRON PO) Take 25 mg by mouth daily.     fluticasone (FLONASE) 50 MCG/ACT nasal spray Place 2 sprays into both nostrils daily as needed for allergies or rhinitis.     ibuprofen (ADVIL) 800 MG tablet TAKE 1 TABLET EVERY 8 HOURS FOR 10 DAYS. PLEASE TAKE WITH FOOD, PLEASE ALTERNATE WITH ACETAMINOPHEN 30 tablet 0   magnesium gluconate (MAGONATE) 500 MG tablet Take 500 mg by mouth 2 (two) times daily.     METAMUCIL FIBER PO Take 4 capsules by mouth 2 (two) times daily.     Multiple Vitamins-Minerals (ADULT GUMMY PO) Take 2 capsules by mouth daily.     Multiple Vitamins-Minerals (HAIR SKIN NAILS) CAPS Take 1 tablet by mouth daily.     niacin (VITAMIN B3) 500 MG tablet Take 500 mg by mouth daily.     Omega-3 1000 MG CAPS Take 2,000 mg by mouth daily.     omeprazole (PRILOSEC) 40 MG capsule TAKE 1 CAPSULE BY MOUTH IN THE MORNING AND AT BEDTIME. 60 capsule 5   Polyethyl Glycol-Propyl Glycol (SYSTANE OP) Place 1 drop into both eyes daily as needed (irritation).     Probiotic Product (PROBIOTIC PO) Take 1 capsule by mouth daily.     vitamin B-12 (CYANOCOBALAMIN)  500 MCG tablet Take 500 mcg by mouth daily.     zolpidem (AMBIEN) 10 MG tablet Take 10 mg by mouth at bedtime.  No current facility-administered medications for this visit.   No results found.  Review of Systems:   A ROS was performed including pertinent positives and negatives as documented in the HPI.   Musculoskeletal Exam:    There were no vitals taken for this visit.  Left shoulder incisions are well-healed.  Active forward elevation bilaterally is to 160 degrees with external rotation at the side of 50 degrees.  Internal rotation is to L1 bilaterally.  Strength examination deferred today.  Remainder of neurosensory exam is intact  Imaging:      I personally reviewed and interpreted the radiographs.   Assessment:   12 weeks status post left shoulder rotator cuff repair overall doing well.  At this time she will continue active range of motion as well as the strengthening portion of the protocol.  I will plan to see her back in 3 months for final check.  All limitations and restrictions were discussed   Plan :    -Return to clinic in 12 weeks for reassessment     I personally saw and evaluated the patient, and participated in the management and treatment plan.  Vanetta Mulders, MD Attending Physician, Orthopedic Surgery  This document was dictated using Dragon voice recognition software. A reasonable attempt at proof reading has been made to minimize errors.

## 2022-02-21 ENCOUNTER — Encounter (HOSPITAL_BASED_OUTPATIENT_CLINIC_OR_DEPARTMENT_OTHER): Payer: Medicare HMO | Admitting: Physical Therapy

## 2022-02-23 ENCOUNTER — Encounter (HOSPITAL_BASED_OUTPATIENT_CLINIC_OR_DEPARTMENT_OTHER): Payer: Medicare HMO | Admitting: Physical Therapy

## 2022-02-28 ENCOUNTER — Encounter (HOSPITAL_BASED_OUTPATIENT_CLINIC_OR_DEPARTMENT_OTHER): Payer: Medicare HMO | Admitting: Physical Therapy

## 2022-03-02 ENCOUNTER — Encounter (HOSPITAL_BASED_OUTPATIENT_CLINIC_OR_DEPARTMENT_OTHER): Payer: Medicare HMO | Admitting: Physical Therapy

## 2022-03-06 ENCOUNTER — Other Ambulatory Visit (HOSPITAL_BASED_OUTPATIENT_CLINIC_OR_DEPARTMENT_OTHER): Payer: Self-pay | Admitting: Orthopaedic Surgery

## 2022-03-25 ENCOUNTER — Other Ambulatory Visit: Payer: Self-pay | Admitting: Allergy and Immunology

## 2022-03-29 DIAGNOSIS — R69 Illness, unspecified: Secondary | ICD-10-CM | POA: Diagnosis not present

## 2022-03-29 DIAGNOSIS — F4323 Adjustment disorder with mixed anxiety and depressed mood: Secondary | ICD-10-CM | POA: Diagnosis not present

## 2022-03-31 ENCOUNTER — Other Ambulatory Visit: Payer: Self-pay

## 2022-03-31 MED ORDER — OMEPRAZOLE 40 MG PO CPDR: DELAYED_RELEASE_CAPSULE | ORAL | 5 refills | Status: DC

## 2022-04-14 DIAGNOSIS — R931 Abnormal findings on diagnostic imaging of heart and coronary circulation: Secondary | ICD-10-CM | POA: Diagnosis not present

## 2022-04-14 DIAGNOSIS — E785 Hyperlipidemia, unspecified: Secondary | ICD-10-CM | POA: Diagnosis not present

## 2022-04-14 DIAGNOSIS — M7989 Other specified soft tissue disorders: Secondary | ICD-10-CM | POA: Diagnosis not present

## 2022-04-15 ENCOUNTER — Telehealth: Payer: Self-pay | Admitting: Cardiology

## 2022-04-15 LAB — LIPID PANEL
Chol/HDL Ratio: 3.1 ratio (ref 0.0–4.4)
Cholesterol, Total: 198 mg/dL (ref 100–199)
HDL: 63 mg/dL (ref >39)
LDL Chol Calc (NIH): 121 mg/dL — ABNORMAL HIGH (ref 0–99)
Triglycerides: 79 mg/dL (ref 0–149)
VLDL Cholesterol Cal: 14 mg/dL (ref 5–40)

## 2022-04-15 NOTE — Telephone Encounter (Signed)
Patient stated she is taking Zetia but have not taken praluent in 3 months since December.

## 2022-04-15 NOTE — Telephone Encounter (Signed)
Patient returned RN's call. 

## 2022-04-18 ENCOUNTER — Encounter: Payer: Self-pay | Admitting: *Deleted

## 2022-04-18 ENCOUNTER — Other Ambulatory Visit: Payer: Self-pay | Admitting: Cardiology

## 2022-04-18 MED ORDER — PRALUENT 150 MG/ML ~~LOC~~ SOAJ: 150.0000 mg | SUBCUTANEOUS | 0 refills | Status: DC

## 2022-04-18 NOTE — Telephone Encounter (Signed)
Received fax from pharmacy that prior authorization is needed for Praluent.

## 2022-04-18 NOTE — Telephone Encounter (Signed)
Patient reports she ran out of refills that is why she was not taking Praluent. There was no affordability or tolerance problems.  Sent prescription for Praluent 150 mg -3 months supply with 3 refills to patient's preferred pharmacy.

## 2022-04-18 NOTE — Telephone Encounter (Signed)
Msg sent to pharm tech to assist with Praluent prior authorization.

## 2022-04-19 ENCOUNTER — Telehealth: Payer: Self-pay | Admitting: *Deleted

## 2022-04-19 NOTE — Telephone Encounter (Signed)
Rosey Bath left a msg on the refill vm stating that praluent is non formulary. Repatha is preferred but both praluent and repatha will require a PA. Call [phone] to complete PA.

## 2022-04-20 ENCOUNTER — Telehealth: Payer: Self-pay

## 2022-04-20 ENCOUNTER — Other Ambulatory Visit (HOSPITAL_COMMUNITY): Payer: Self-pay

## 2022-04-20 DIAGNOSIS — E785 Hyperlipidemia, unspecified: Secondary | ICD-10-CM

## 2022-04-20 DIAGNOSIS — R931 Abnormal findings on diagnostic imaging of heart and coronary circulation: Secondary | ICD-10-CM

## 2022-04-20 NOTE — Telephone Encounter (Signed)
Pharmacy Patient Advocate Encounter   Received notification from [facility] MEDICARE that prior authorization for REPATHA is needed.    PA submitted on 04/20/22 Key [id number] Status is pending  Haze Rushing, CPhT Pharmacy Patient Advocate Specialist Direct Number: [phone] Fax: [phone]

## 2022-04-20 NOTE — Telephone Encounter (Signed)
Please complete PA for repatha

## 2022-04-20 NOTE — Telephone Encounter (Signed)
Pharmacy Patient Advocate Encounter  Prior Authorization for REPATHA has been approved.    Effective dates: 01/03/22 through 01/03/23  Maverick Dieudonne, CPhT Pharmacy Patient Advocate Specialist Direct Number: [phone] Fax: [phone] 

## 2022-05-03 ENCOUNTER — Other Ambulatory Visit (HOSPITAL_COMMUNITY): Payer: Self-pay

## 2022-05-03 ENCOUNTER — Other Ambulatory Visit: Payer: Self-pay | Admitting: Cardiology

## 2022-05-03 NOTE — Telephone Encounter (Signed)
         Per PharmD request: see the attached test claims

## 2022-05-03 NOTE — Telephone Encounter (Addendum)
Pt has Praluent on med list but you had Dorathy Daft submit PA for Repatha and looks like it was approved, are you following with this? Most recent note to pt says for her to take Praluent.

## 2022-05-04 MED ORDER — REPATHA SURECLICK 140 MG/ML ~~LOC~~ SOAJ: 1.0000 mL | SUBCUTANEOUS | 11 refills | Status: DC

## 2022-05-04 NOTE — Addendum Note (Signed)
Addended by: Cheree Ditto on: 05/04/2022 09:11 AM   Modules accepted: Orders

## 2022-05-04 NOTE — Telephone Encounter (Signed)
Spoke with patient and advised that plan will no longer cover Praluent but will pay for Repatha. Patient understands. Reports she had stopped taking it which is why her LDL went back up. Recommended to recheck in about 3 months since she is back on the medication.

## 2022-05-08 IMAGING — MG MM DIGITAL SCREENING BILAT W/ TOMO AND CAD
8 series · 9 of 24 positions shown · non-contrast
Comparison: Previous exam(s).

CLINICAL DATA: Screening.

EXAM:
DIGITAL SCREENING BILATERAL MAMMOGRAM WITH TOMOSYNTHESIS AND CAD
TECHNIQUE: Bilateral screening digital craniocaudal and mediolateral oblique
mammograms were obtained. Bilateral screening digital breast
tomosynthesis was performed. The images were evaluated with
computer-aided detection.

[R CC synth-2D]
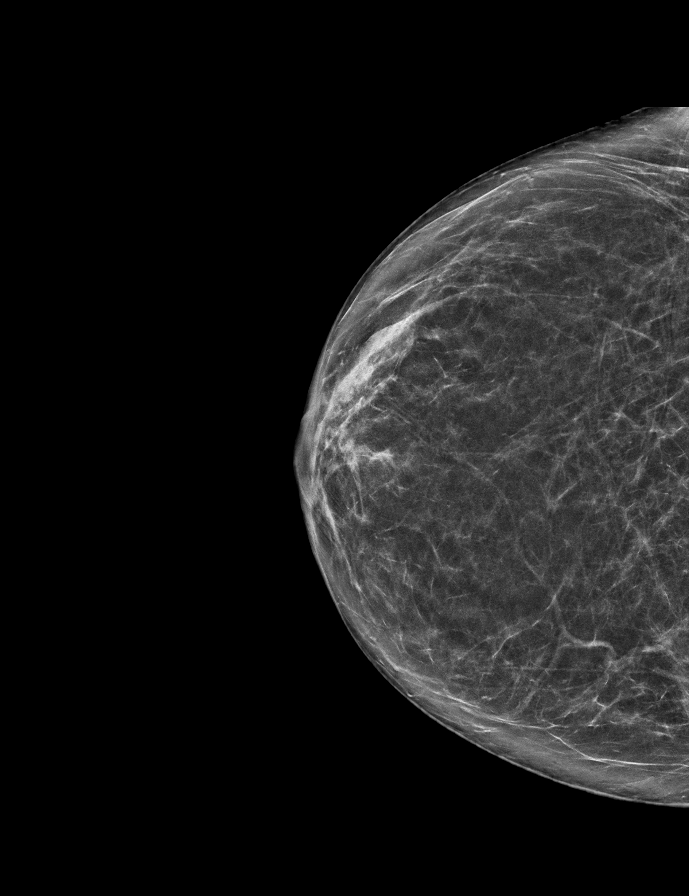

[L MLO synth-2D]
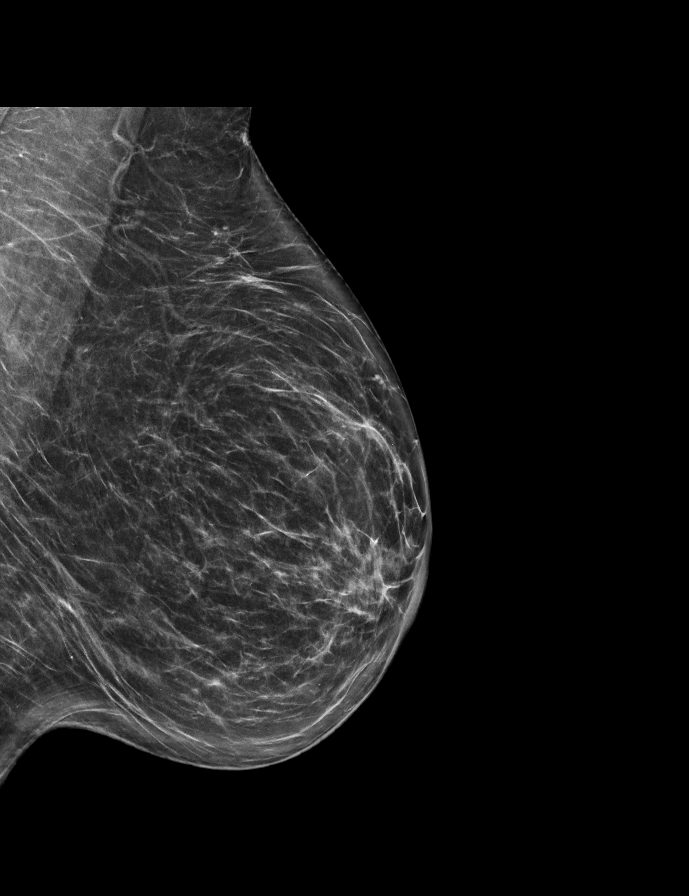

[L CC synth-2D]
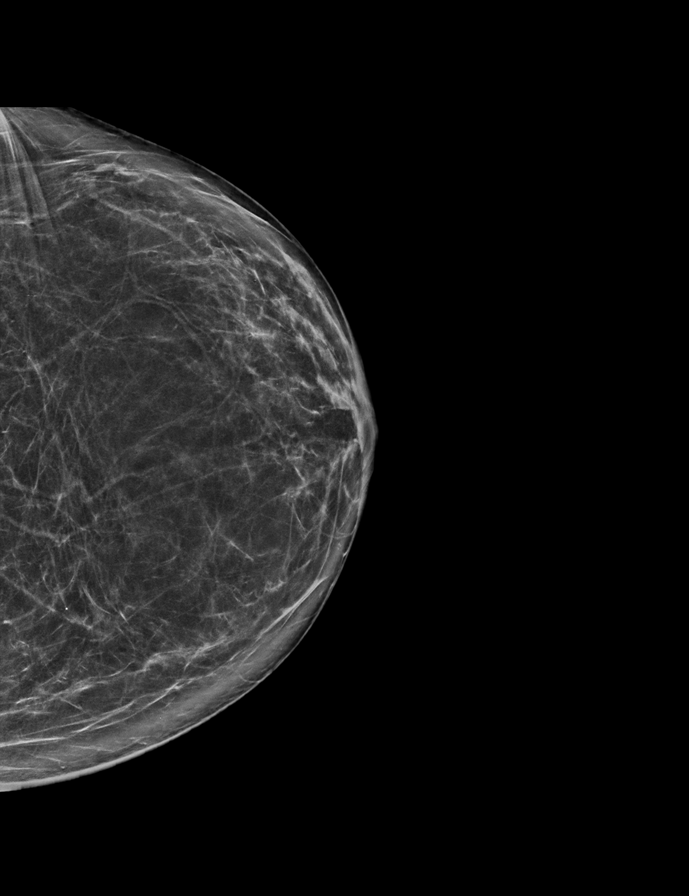

[R MLO synth-2D]
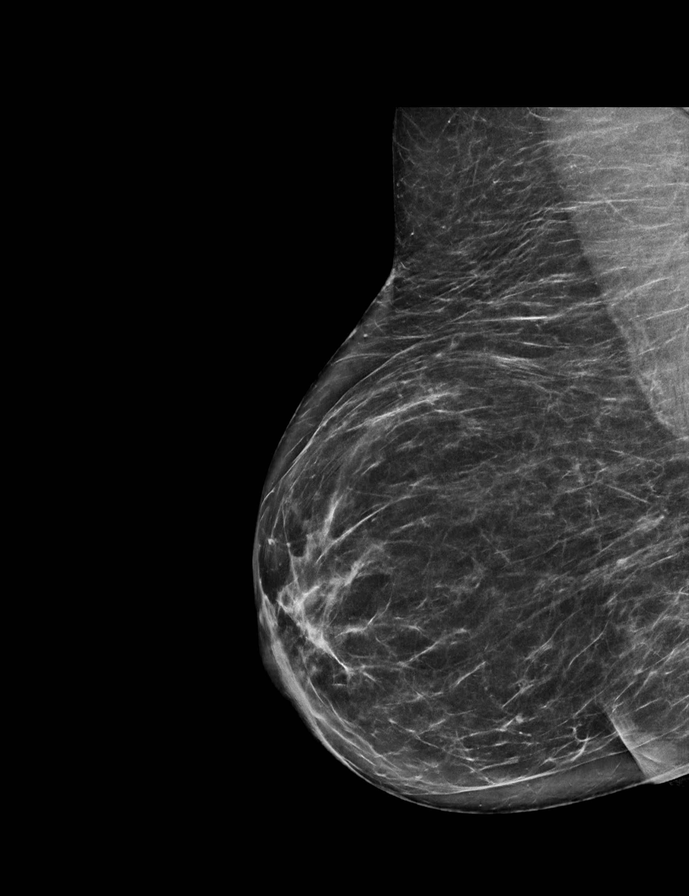

[L MLO tomo · 2 of 70 frames shown]
[frame 23/70]
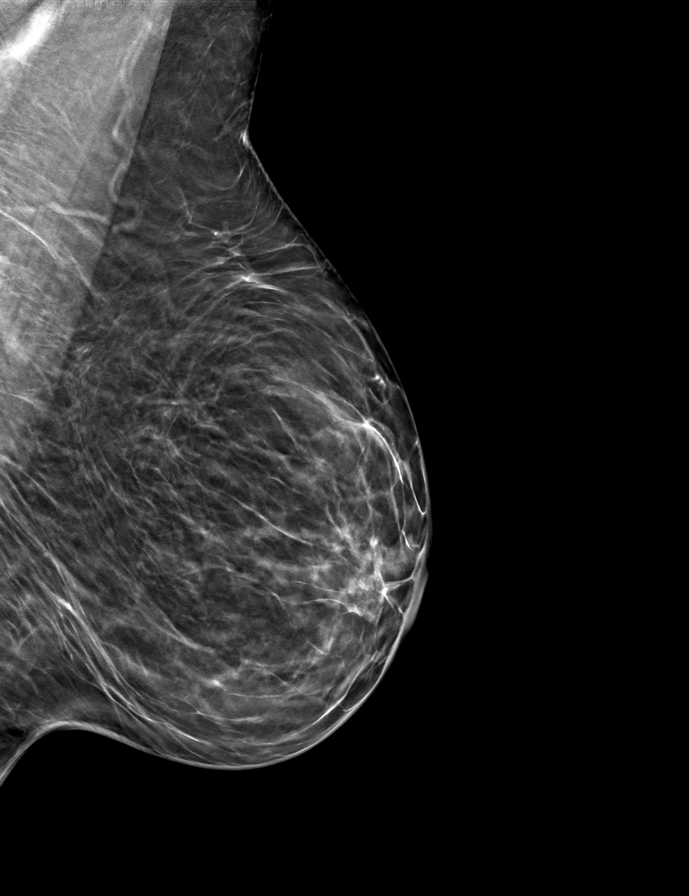
[frame 35/70]
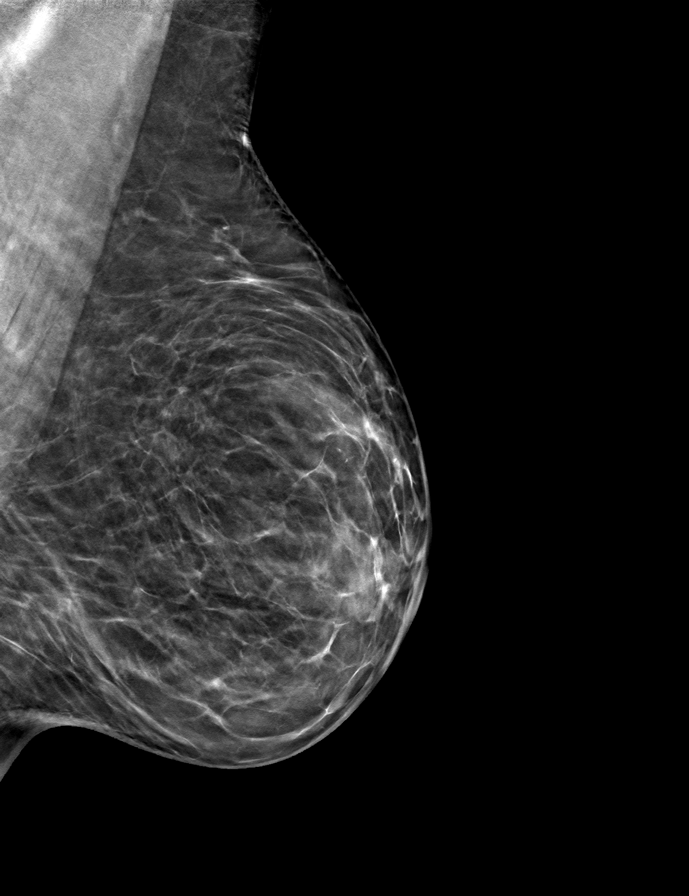

[R CC tomo · tomo slice 33/64.0]
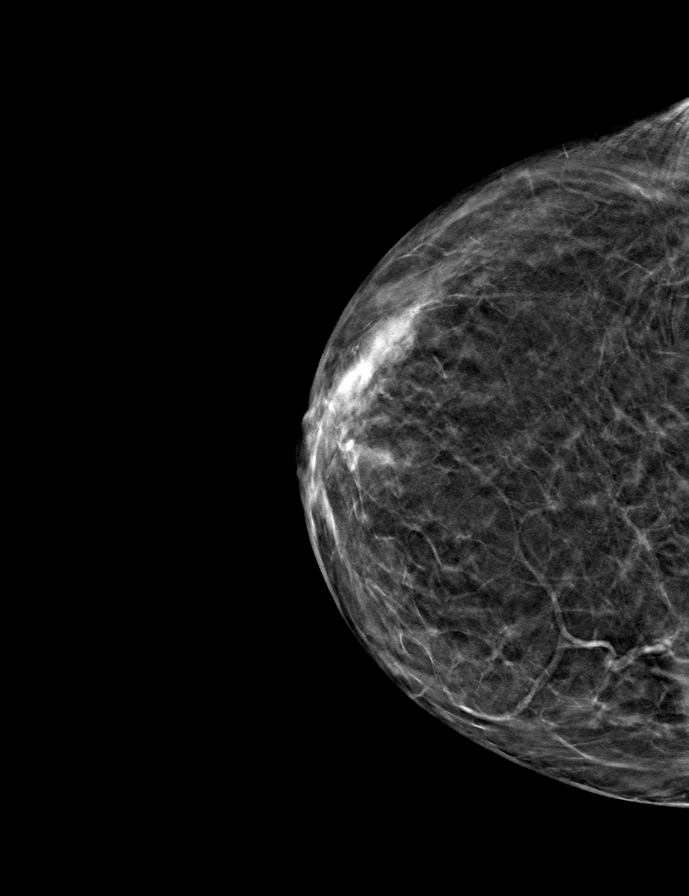

[L CC tomo · tomo slice 35/70.0]
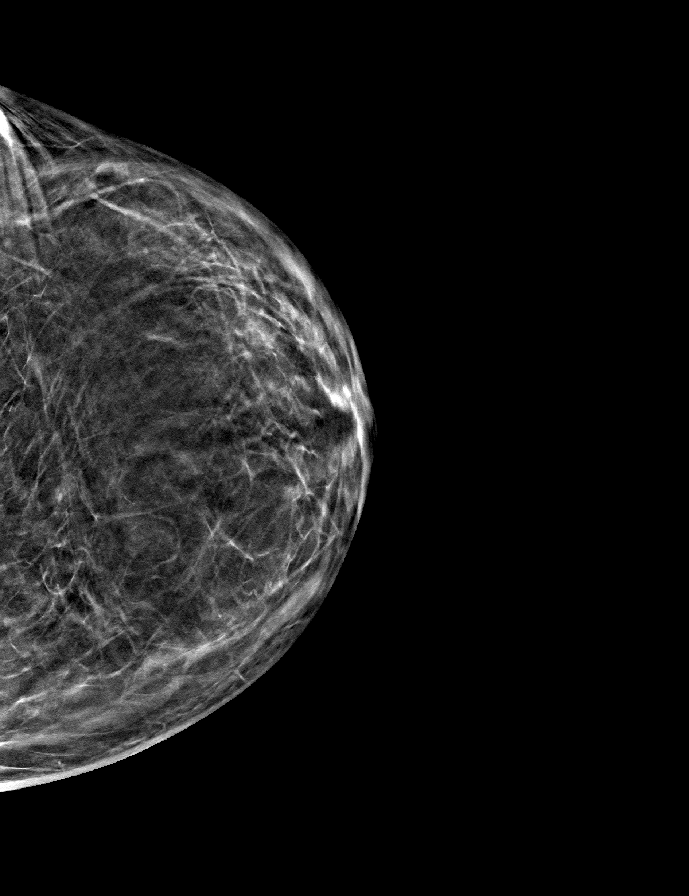

[R MLO tomo · tomo slice 35/69.0]
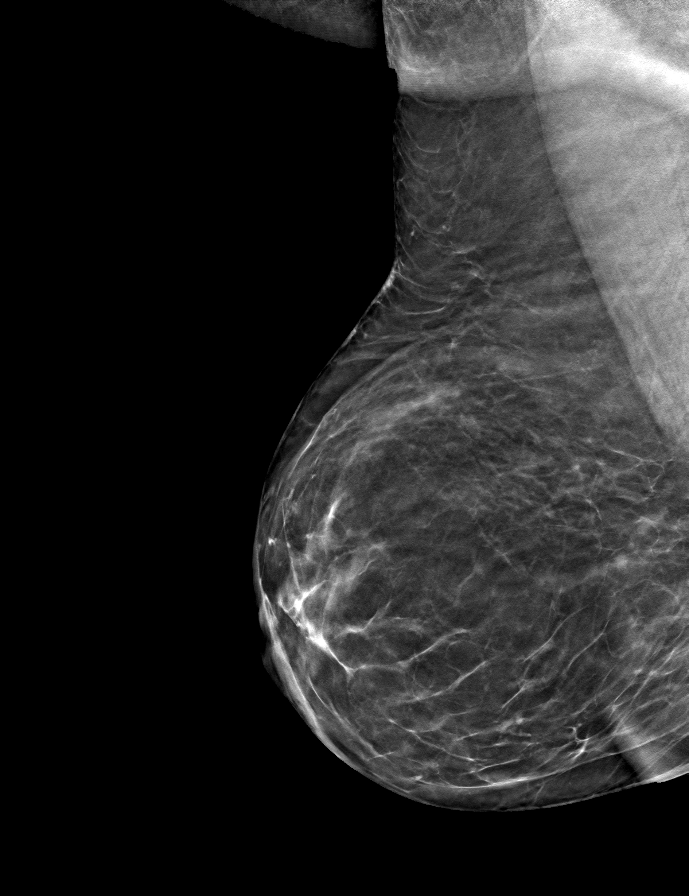

[9 of 24 positions shown; findings below may reference images not displayed]

ACR Breast Density Category b: There are scattered areas of
fibroglandular density.
FINDINGS: There are no findings suspicious for malignancy.
IMPRESSION: No mammographic evidence of malignancy. A result letter of this
screening mammogram will be mailed directly to the patient.

RECOMMENDATION:
Screening mammogram in one year. (Code:51-O-LD2)

BI-RADS CATEGORY  1: Negative.

## 2022-05-10 ENCOUNTER — Telehealth: Payer: Self-pay | Admitting: Internal Medicine

## 2022-05-10 NOTE — Telephone Encounter (Signed)
Contacted Nina E Binette to schedule their annual wellness visit. Patient declined to schedule AWV at this time.  [facility] Care Guide [facility] AWV TEAM Direct Dial: [phone]

## 2022-05-18 ENCOUNTER — Ambulatory Visit (HOSPITAL_BASED_OUTPATIENT_CLINIC_OR_DEPARTMENT_OTHER): Payer: Medicare HMO | Admitting: Orthopaedic Surgery

## 2022-05-18 ENCOUNTER — Ambulatory Visit (INDEPENDENT_AMBULATORY_CARE_PROVIDER_SITE_OTHER): Payer: Medicare HMO

## 2022-05-18 DIAGNOSIS — G8929 Other chronic pain: Secondary | ICD-10-CM

## 2022-05-18 DIAGNOSIS — M25511 Pain in right shoulder: Secondary | ICD-10-CM

## 2022-05-18 DIAGNOSIS — M19011 Primary osteoarthritis, right shoulder: Secondary | ICD-10-CM

## 2022-05-18 MED ORDER — IBUPROFEN 800 MG PO TABS: 800.0000 mg | ORAL_TABLET | Freq: Three times a day (TID) | ORAL | 0 refills | Status: AC

## 2022-05-18 MED ORDER — METHOCARBAMOL 500 MG PO TABS: 500.0000 mg | ORAL_TABLET | Freq: Four times a day (QID) | ORAL | 3 refills | Status: DC

## 2022-05-18 NOTE — Progress Notes (Signed)
Post Operative Evaluation    Procedure/Date of Surgery: Left shoulder rotator cuff repair with collagen patch augmentation and biceps tenodesis 11/20  Interval History:    Presents today status post left shoulder rotator cuff repair overall doing very well.  Overall Grace Taylor is doing quite well on the left side.  Grace Taylor is now full strength without any pain with regard to the left shoulder.  Grace Taylor is complaining of some catching on the right shoulder today although this is not necessarily painful  PMH/PSH/Family History/Social History/Meds/Allergies:    Past Medical History:  Diagnosis Date   Allergy    Anemia    Benign colon polyp 04/07/2017   Q 5 year recall   Chronic neck pain - post injury, worker's comp 2013, 2018 04/07/2017   Dr. Dion Saucier   Clostridium difficile infection    History of peptic ulcer 04/07/2017   Hyperlipidemia    IBS (irritable bowel syndrome) 04/07/2017   Colonoscopy by Leone Payor   Migraines    PTSD (post-traumatic stress disorder) 04/07/2017   Ulcer    age 62   Past Surgical History:  Procedure Laterality Date   CHOLECYSTECTOMY  2012   COLONOSCOPY  2014   DG GALL BLADDER     2012   ESOPHAGOGASTRODUODENOSCOPY  2014   FLEXIBLE SIGMOIDOSCOPY  03/2014   OVARIAN CYST REMOVAL     right shoulder surgery     SHOULDER ARTHROSCOPY WITH ROTATOR CUFF REPAIR AND OPEN BICEPS TENODESIS Left 11/22/2021   Procedure: LEFT SHOULDER ARTHROSCOPY WITH ROTATOR CUFF REPAIR AND BICEPS TENODESIS;  Surgeon: Huel Cote, MD;  Location: [facility] OR;  Service: Orthopedics;  Laterality: Left;   TMJ ARTHROSCOPY     Social History   Socioeconomic History   Marital status: Divorced    Spouse name: Not on file   Number of children: 0   Years of education: some college   Highest education level: Not on file  Occupational History   Occupation: former Naval architect   Occupation: disabled  Tobacco Use   Smoking status: Never   Smokeless tobacco: Never   Vaping Use   Vaping Use: Never used  Substance and Sexual Activity   Alcohol use: Not Currently   Drug use: No   Sexual activity: Yes    Birth control/protection: Condom  Other Topics Concern   Not on file  Social History Narrative   Worked at the [employer], on SSD or SSI - but no health insurance   Social Determinants of Health   Financial Resource Strain: Medium Risk (04/07/2017)   Overall Financial Resource Strain (CARDIA)    Difficulty of Paying Living Expenses: Somewhat hard  Food Insecurity: Food Insecurity Present (04/07/2017)   Hunger Vital Sign    Worried About Running Out of Food in the Last Year: Sometimes true    Ran Out of Food in the Last Year: Not on file  Transportation Needs: Not on file  Physical Activity: Not on file  Stress: Stress Concern Present (04/07/2017)   [employer] of Occupational Health - Occupational Stress Questionnaire    Feeling of Stress : Very much  Social Connections: Not on file   Family History  Problem Relation Age of Onset   Hypertension Mother    Alcohol abuse Mother    Arthritis Mother    Cancer Mother    Heart  disease Mother    Kidney disease Mother    Mental illness Mother    Hypertension Father    CAD Father 22       CABG    Hyperlipidemia Father    Heart disease Father    Early death Father    Heart attack Father    Hypertension Brother    Alcohol abuse Brother    Heart disease Brother    Breast cancer Maternal Aunt    Cancer Maternal Aunt        breast cancer   Hypertension Sister    Hyperlipidemia Sister    Arthritis Sister    Allergies  Allergen Reactions   Claritin [Loratadine]     Affects eyes    Codeine     Hyperactivity    Crestor [Rosuvastatin]     INEFFECTIVE AT LOWERING LDL   Lipitor [Atorvastatin]     INEFFECTIVE AT LOWERING LDL   Penicillins Hives   Simvastatin     ELEVATED LIVER ENZYMES   Sulfonamide Derivatives Nausea And Vomiting and Rash   Current Outpatient Medications  Medication  Sig Dispense Refill   ibuprofen (ADVIL) 800 MG tablet Take 1 tablet (800 mg total) by mouth every 8 (eight) hours for 10 days. Please take with food, please alternate with acetaminophen 30 tablet 0   methocarbamol (ROBAXIN) 500 MG tablet Take 1 tablet (500 mg total) by mouth 4 (four) times daily. 30 tablet 3   Ascorbic Acid (VITAMIN C) 1000 MG tablet Take 1,000 mg by mouth daily.     aspirin EC 325 MG tablet Take 1 tablet (325 mg total) by mouth daily. 30 tablet 0   Calcium-Magnesium-Zinc (CAL-MAG-ZINC PO) Take 1 tablet by mouth daily.     Carboxymethylcellulose Sodium (THERATEARS OP) Place 1 drop into both eyes daily as needed (irritation).     clonazePAM (KLONOPIN) 0.5 MG tablet Take 0.5-1 tablets by mouth See admin instructions. Take 0.5 mg in the morning and 1 mg in the evening  1   Evolocumab (REPATHA SURECLICK) 140 MG/ML SOAJ Inject 140 mg into the skin every 14 (fourteen) days. 2 mL 11   ezetimibe (ZETIA) 10 MG tablet Take 1 tablet (10 mg total) by mouth daily. 90 tablet 3   famotidine (PEPCID) 40 MG tablet TAKE 1 TABLET BY MOUTH EVERY DAY IN THE EVENING 90 tablet 1   Ferrous Sulfate (IRON PO) Take 25 mg by mouth daily.     fluticasone (FLONASE) 50 MCG/ACT nasal spray Place 2 sprays into both nostrils daily as needed for allergies or rhinitis.     ibuprofen (ADVIL) 800 MG tablet TAKE 1 TABLET EVERY 8 HOURS FOR 10 DAYS. PLEASE TAKE WITH FOOD, PLEASE ALTERNATE WITH ACETAMINOPHEN 30 tablet 0   magnesium gluconate (MAGONATE) 500 MG tablet Take 500 mg by mouth 2 (two) times daily.     METAMUCIL FIBER PO Take 4 capsules by mouth 2 (two) times daily.     Multiple Vitamins-Minerals (ADULT GUMMY PO) Take 2 capsules by mouth daily.     Multiple Vitamins-Minerals (HAIR SKIN NAILS) CAPS Take 1 tablet by mouth daily.     niacin (VITAMIN B3) 500 MG tablet Take 500 mg by mouth daily.     Omega-3 1000 MG CAPS Take 2,000 mg by mouth daily.     omeprazole (PRILOSEC) 40 MG capsule TAKE 1 CAPSULE BY MOUTH IN  THE MORNING AND AT BEDTIME. 60 capsule 5   Polyethyl Glycol-Propyl Glycol (SYSTANE OP) Place 1 drop into both eyes daily  as needed (irritation).     Probiotic Product (PROBIOTIC PO) Take 1 capsule by mouth daily.     vitamin B-12 (CYANOCOBALAMIN) 500 MCG tablet Take 500 mcg by mouth daily.     zolpidem (AMBIEN) 10 MG tablet Take 10 mg by mouth at bedtime.      No current facility-administered medications for this visit.   No results found.  Review of Systems:   A ROS was performed including pertinent positives and negatives as documented in the HPI.   Musculoskeletal Exam:    There were no vitals taken for this visit.  Left shoulder incisions are well-healed.  Active forward elevation bilaterally is to 160 degrees with external rotation at the side of 50 degrees.  Internal rotation is to L1 bilaterally.  Strength examination deferred today.  Remainder of neurosensory exam is intact  Tenderness over right glenohumeral joint.  There is some crepitus.  No pain.  Full active forward elevation to 170 degrees with external rotation at the side to 60 degrees and internal rotation to L1  Imaging:    3 views right shoulder; Mild degenerative findings  I personally reviewed and interpreted the radiographs.   Assessment:   6 months status post left shoulder rotator cuff repair overall doing well.  Overall Grace Taylor is doing quite well on the left side.  The right shoulder Grace Taylor just has some mild glenohumeral osteoarthritis.  This time Grace Taylor will use topical preparations with lidocaine as well as physical therapy exercises on the right as well.  I will plan to see her back as needed   Plan :    -Return to clinic in as needed     I personally saw and evaluated the patient, and participated in the management and treatment plan.  Huel Cote, MD Attending Physician, Orthopedic Surgery  This document was dictated using Dragon voice recognition software. A reasonable attempt at proof reading  has been made to minimize errors.

## 2022-06-06 ENCOUNTER — Encounter: Payer: Self-pay | Admitting: Internal Medicine

## 2022-06-09 ENCOUNTER — Ambulatory Visit (INDEPENDENT_AMBULATORY_CARE_PROVIDER_SITE_OTHER): Payer: Medicare HMO | Admitting: Family Medicine

## 2022-06-09 ENCOUNTER — Encounter: Payer: Self-pay | Admitting: Family Medicine

## 2022-06-09 VITALS — BP 126/72 | HR 80 | Temp 97.6°F | Ht 66.0 in | Wt 167.0 lb

## 2022-06-09 DIAGNOSIS — S30861A Insect bite (nonvenomous) of abdominal wall, initial encounter: Secondary | ICD-10-CM | POA: Diagnosis not present

## 2022-06-09 DIAGNOSIS — S80261A Insect bite (nonvenomous), right knee, initial encounter: Secondary | ICD-10-CM

## 2022-06-09 DIAGNOSIS — W57XXXA Bitten or stung by nonvenomous insect and other nonvenomous arthropods, initial encounter: Secondary | ICD-10-CM

## 2022-06-09 MED ORDER — TRIAMCINOLONE ACETONIDE 0.025 % EX OINT: 1.0000 | TOPICAL_OINTMENT | Freq: Two times a day (BID) | CUTANEOUS | 0 refills | Status: DC

## 2022-06-09 MED ORDER — MUPIROCIN 2 % EX OINT: 1.0000 | TOPICAL_OINTMENT | Freq: Two times a day (BID) | CUTANEOUS | 0 refills | Status: DC

## 2022-06-09 NOTE — Patient Instructions (Signed)
Use the steroid ointment on the bite areas.   You are having a localized reaction currently.   If you develop fever, severe headache, severe joint pain, vomiting or a rash on your wrist, hands or feet, you should be seen right away.   Take precautions to avoid insect and tick bites in the future.     Tick Bite Information, Adult  Ticks are insects that can bite. Most ticks live in bushes and grassy areas. They climb onto people and animals that go by. Then they bite. Some ticks carry germs that can make you sick. How can I prevent tick bites? Take these steps: Before you go outside: Wear long sleeves and long pants. Wear light-colored clothes. Tuck your pant legs into your socks. Use an insect repellent that has 20% or higher of the ingredients DEET, picaridin, or IR3535. Follow the instructions on the label. Put it on: Bare skin. Avoid your eyes and mouth areas. The tops of your boots. Your pant legs. The ends of your sleeves. If you use an insect repellent that has the ingredient permethrin, follow the instructions on the label. Do not put permethrin on the skin. Put it on: Clothing. Shoes. Outdoor gear. Tents. When you are outside Avoid walking through long grass. Stay in the middle of the trail. Do not touch the bushes. Check for ticks on your clothes, hair, and skin often while you are outside. Check again before you go inside. When you go indoors Check your clothes for ticks. Dry your clothes in a dryer on high heat for 10 minutes or more. If clothes are damp, more time may be needed. Wash your clothes right away if they need to be washed. Use hot water. Check your pets and outdoor gear. Shower right away. Check your body for ticks. Do a full body check using a mirror. Check your clothes, skin, head, neck, armpits, waist, groin, and joint areas. What is the right way to remove a tick? Remove the tick from your skin as soon as possible. Do not remove the tick with your  bare fingers. Do not try to remove a tick with heat, alcohol, petroleum jelly, or fingernail polish. To remove a tick that is crawling on your skin: Go outside and brush the tick off. Use tape or a lint roller. To remove a tick that is biting: Wash your hands. If you have gloves, put them on. Use tweezers, curved forceps, or a tick-removal tool to grasp the tick. Grasp the tick as close to your skin and as close to the tick's head as possible. Gently pull up until the tick lets go. Try to keep the tick's head attached to its body. Do not twist or jerk the tick. Do not squeeze or crush the tick. What should I do after taking out a tick? Clean the bite area and your hands with soap and water, rubbing alcohol, or an iodine wash. If you have an antiseptic cream or ointment, put a small amount on the bite area. Wash and disinfect any instruments that you used to remove the tick. How should I get rid of a live tick? To get rid of a live tick, use one of these methods: Place the tick in rubbing alcohol. Place it in a bag or container you can close tightly. Throw it away. Wrap it tightly in tape. Throw it away. Flush it down the toilet. Where to find more information Centers for Disease Control and Prevention: GyrateAtrophy.si [place]. Environmental Protection Agency: RelocationNetworking.fi Contact  a doctor if: You have a fever or chills. You have a red rash that makes a circle (bull's-eye rash) in the bite area. You have redness and swelling where the tick bit you. You have a headache or stiff neck. You have pain in a muscle, joint, or bone. You are more tired than normal. You have trouble walking or moving your legs. You have numbness in your legs. You have tender or swollen lymph glands. You have belly (abdominal) pain, vomiting, watery poop (diarrhea), or weight loss. Get help right away if: You cannot remove a tick. You cannot move (have paralysis) or feel weak. You are feeling worse  or have new symptoms. You find a tick that is biting you and filled with blood, especially if you are in an area where diseases from ticks are common. Summary Ticks may carry germs that can make you sick. To prevent tick bites wear long sleeves, long pants, and light colors. Use insect repellent. Follow the instructions on the label. If the tick is biting, remove it right away. Do not try to remove it with heat, alcohol, petroleum jelly, or fingernail polish. Contact a doctor if you have symptoms of a disease after being bitten by a tick. This information is not intended to replace advice given to you by your health care provider. Make sure you discuss any questions you have with your health care provider. Document Revised: 03/22/2021 Document Reviewed: 03/22/2021 Elsevier Patient Education  2024 [employer]    Insect Bite, Adult An insect bite can make your skin red, itchy, and swollen. Some insects can spread disease to people with a bite. However, most insect bites do not lead to disease, and most are not serious. What are the causes? Insects may bite for many reasons, including: Hunger. To defend themselves. Insects that bite include: Spiders. Mosquitoes. Flies. Ticks and fleas. Ants. Kissing bugs. Chiggers. What are the signs or symptoms? Symptoms often last for 2-4 days. However, itching can last up to 10 days. Symptoms include: Itching or pain in the bite area. Redness and swelling in the bite area. An open wound. In rare cases, a person may have a very bad allergic reaction (anaphylactic reaction) to a bite. Symptoms of an anaphylactic reaction may include: Feeling warm in the face (flushed). Your face may turn red. Itchy, red, swollen areas of skin (hives). Swelling of the eyes, lips, face, mouth, tongue, or throat. Trouble with breathing, talking, or swallowing. High-pitched whistling sounds, most often when breathing out (wheezing). Feeling dizzy or  light-headed. Fainting. Pain or cramps in your belly (abdomen). Vomiting. Watery poop (diarrhea). How is this treated? Most insect bites are not serious. Symptoms often go away on their own. When treatment is advised, it may include: Putting ice on the bite area. Putting a cream or lotion, like calamine lotion, on the bite area. This helps with itching. Using medicines called antihistamines. You may also need: A tetanus shot if you are not up to date. An antibiotic cream or medicine. This treatment is needed if the bite area gets infected. Follow these instructions at home: Bite area care  Do not scratch the bite area. It may help to cover the bite area with a bandage or close-fitting clothing. Keep the bite area clean and dry. Check the bite area every day for signs of infection. Check for: More redness, swelling, or pain. Fluid or blood. Warmth. Pus or a bad smell. Wash your hands often. Managing pain, itching, and swelling  You may put  any of these on the bite area as told by your doctor: A paste made of baking soda and water. Cortisone cream. Calamine lotion. If told, put ice on the bite area. To do this: Put ice in a plastic bag. Place a towel between your skin and the bag. Leave the ice on for 20 minutes, 2-3 times a day. If your skin turns bright red, take off the ice right away to prevent skin damage. The risk of skin damage is higher if you cannot feel pain, heat, or cold. General instructions Apply or take over-the-counter and prescription medicines only as told by your doctor. If you were prescribed antibiotics, take or apply them as told by your doctor. Do not stop using them even if you start to feel better. How is this prevented? To help you have a lower risk of insect bites: When you are outside, wear clothes that cover your arms and legs. Use insect repellent. The best insect repellents contain one of these: DEET. Picaridin. Oil of lemon eucalyptus  (OLE). [id number]. Consider spraying your clothing with a pesticide called permethrin. Permethrin helps prevent insect bites. It works for several weeks and for up to 5-6 clothing washes. Do not apply permethrin directly to the skin. If your home windows do not have screens, think about putting some in. If you will be sleeping in an area where there are mosquitoes, consider covering your sleeping area with a mosquito net. Contact a doctor if: You have redness, swelling, or pain in the bite area. You have fluid or blood coming from the bite area. The bite area feels warm to the touch. You have pus or a bad smell coming from the bite area. You have a fever. Get help right away if: You have joint pain. You have a rash. You feel weak or more tired than you normally do. You have neck pain or a headache. You have signs of an anaphylactic reaction. Signs may include: Swelling of your eyes, lips, face, mouth, tongue, or throat. Feeling warm in the face. Itchy, red, swollen areas of skin. Trouble with breathing, talking, or swallowing. Wheezing. Feeling dizzy or light-headed. Fainting. Pain or cramps in your belly. Vomiting or watery poop. These symptoms may be an emergency. Get help right away. Call 911. Do not wait to see if symptoms will go away. Do not drive yourself to the hospital. Summary An insect bite can make your skin red, itchy, and swollen. Treatment is usually not needed. Symptoms often go away on their own. Do not scratch the bite area. Keep it clean and dry. Use insect repellent to help prevent insect bites. Contact a doctor if you have signs of infection. This information is not intended to replace advice given to you by your health care provider. Make sure you discuss any questions you have with your health care provider. Document Revised: 03/16/2021 Document Reviewed: 03/16/2021 Elsevier Patient Education  2024 [employer].

## 2022-06-09 NOTE — Progress Notes (Signed)
Subjective:     Patient ID: Grace Taylor, female    DOB: Sep 09, 1960, 62 y.o.   MRN: [id number]  Chief Complaint  Patient presents with   Insect Bite    Bite on back of leg, not bothering her but has bump, happened "Sunday.  Itching bites on abdomen as well    HPI  Discussed the use of AI scribe software for clinical note transcription with the patient, who gave verbal consent to proceed.  History of Present Illness         States she was bitten by a tick on the back of her right leg 4 days ago. States the tick was on her leg for no more than 24 hours.  States the tick was possibly engorged.  She removed it with tweezer and applied rubbing alcohol.   She also reports having a bite from some insect on her right abdomen. Area is itchy.   C/o frontal headache all week, not severe.   No fever, neck pain, arthralgias, nausea, vomiting, or rash to any other body part.     Health Maintenance Due  Topic Date Due   PAP SMEAR-Modifier  08/11/2019   Medicare Annual Wellness (AWV)  05/30/2021   MAMMOGRAM  01/11/2022   Colonoscopy  10/08/2022    Past Medical History:  Diagnosis Date   Allergy    Anemia    Benign colon polyp 04/07/2017   Q 5 year recall   Chronic neck pain - post injury, worker's comp 2013, 2018 04/07/2017   Dr. Landau   Clostridium difficile infection    History of peptic ulcer 04/07/2017   Hyperlipidemia    IBS (irritable bowel syndrome) 04/07/2017   Colonoscopy by Gessner   Migraines    PTSD (post-traumatic stress disorder) 04/07/2017   Ulcer    age 21    Past Surgical History:  Procedure Laterality Date   CHOLECYSTECTOMY  2012   COLONOSCOPY  2014   DG GALL BLADDER     20" 12   ESOPHAGOGASTRODUODENOSCOPY  2014   FLEXIBLE SIGMOIDOSCOPY  03/2014   OVARIAN CYST REMOVAL     right shoulder surgery     SHOULDER ARTHROSCOPY WITH ROTATOR CUFF REPAIR AND OPEN BICEPS TENODESIS Left 11/22/2021   Procedure: LEFT SHOULDER ARTHROSCOPY WITH  ROTATOR CUFF REPAIR AND BICEPS TENODESIS;  Surgeon: Huel Cote, MD;  Location: [facility] OR;  Service: Orthopedics;  Laterality: Left;   TMJ ARTHROSCOPY      Family History  Problem Relation Age of Onset   Hypertension Mother    Alcohol abuse Mother    Arthritis Mother    Cancer Mother    Heart disease Mother    Kidney disease Mother    Mental illness Mother    Hypertension Father    CAD Father 27       CABG    Hyperlipidemia Father    Heart disease Father    Early death Father    Heart attack Father    Hypertension Brother    Alcohol abuse Brother    Heart disease Brother    Breast cancer Maternal Aunt    Cancer Maternal Aunt        breast cancer   Hypertension Sister    Hyperlipidemia Sister    Arthritis Sister     Social History   Socioeconomic History   Marital status: Divorced    Spouse name: Not on file   Number of children: 0   Years of education: some college   Highest  education level: Bachelor's degree (e.g., BA, AB, BS)  Occupational History   Occupation: former Naval architect   Occupation: disabled  Tobacco Use   Smoking status: Never   Smokeless tobacco: Never  Vaping Use   Vaping Use: Never used  Substance and Sexual Activity   Alcohol use: Not Currently   Drug use: No   Sexual activity: Yes    Birth control/protection: Condom  Other Topics Concern   Not on file  Social History Narrative   Worked at the [employer], on SSD or SSI - but no health insurance   Social Determinants of Health   Financial Resource Strain: Medium Risk (06/07/2022)   Overall Financial Resource Strain (CARDIA)    Difficulty of Paying Living Expenses: Somewhat hard  Food Insecurity: No Food Insecurity (06/07/2022)   Hunger Vital Sign    Worried About Running Out of Food in the Last Year: Never true    Ran Out of Food in the Last Year: Never true  Transportation Needs: No Transportation Needs (06/07/2022)   PRAPARE - Administrator, Civil Service (Medical): No     Lack of Transportation (Non-Medical): No  Physical Activity: Sufficiently Active (06/07/2022)   Exercise Vital Sign    Days of Exercise per Week: 3 days    Minutes of Exercise per Session: 140 min  Stress: Stress Concern Present (06/07/2022)   [employer] of Occupational Health - Occupational Stress Questionnaire    Feeling of Stress : To some extent  Social Connections: Moderately Integrated (06/07/2022)   Social Connection and Isolation Panel [NHANES]    Frequency of Communication with Friends and Family: More than three times a week    Frequency of Social Gatherings with Friends and Family: More than three times a week    Attends Religious Services: More than 4 times per year    Active Member of [employer] or Organizations: Yes    Attends Engineer, structural: More than 4 times per year    Marital Status: Divorced  Catering manager Violence: Not on file    Outpatient Medications Prior to Visit  Medication Sig Dispense Refill   Ascorbic Acid (VITAMIN C) 1000 MG tablet Take 1,000 mg by mouth daily.     aspirin EC 325 MG tablet Take 1 tablet (325 mg total) by mouth daily. 30 tablet 0   Calcium-Magnesium-Zinc (CAL-MAG-ZINC PO) Take 1 tablet by mouth daily.     Carboxymethylcellulose Sodium (THERATEARS OP) Place 1 drop into both eyes daily as needed (irritation).     clonazePAM (KLONOPIN) 0.5 MG tablet Take 0.5-1 tablets by mouth See admin instructions. Take 0.5 mg in the morning and 1 mg in the evening  1   Evolocumab (REPATHA SURECLICK) 140 MG/ML SOAJ Inject 140 mg into the skin every 14 (fourteen) days. 2 mL 11   ezetimibe (ZETIA) 10 MG tablet Take 1 tablet (10 mg total) by mouth daily. 90 tablet 3   famotidine (PEPCID) 40 MG tablet TAKE 1 TABLET BY MOUTH EVERY DAY IN THE EVENING 90 tablet 1   Ferrous Sulfate (IRON PO) Take 25 mg by mouth daily.     fluticasone (FLONASE) 50 MCG/ACT nasal spray Place 2 sprays into both nostrils daily as needed for allergies or rhinitis.      ibuprofen (ADVIL) 800 MG tablet TAKE 1 TABLET EVERY 8 HOURS FOR 10 DAYS. PLEASE TAKE WITH FOOD, PLEASE ALTERNATE WITH ACETAMINOPHEN 30 tablet 0   magnesium gluconate (MAGONATE) 500 MG tablet Take 500 mg by mouth  2 (two) times daily.     METAMUCIL FIBER PO Take 4 capsules by mouth 2 (two) times daily.     methocarbamol (ROBAXIN) 500 MG tablet Take 1 tablet (500 mg total) by mouth 4 (four) times daily. 30 tablet 3   Multiple Vitamins-Minerals (ADULT GUMMY PO) Take 2 capsules by mouth daily.     Multiple Vitamins-Minerals (HAIR SKIN NAILS) CAPS Take 1 tablet by mouth daily.     niacin (VITAMIN B3) 500 MG tablet Take 500 mg by mouth daily.     Omega-3 1000 MG CAPS Take 2,000 mg by mouth daily.     omeprazole (PRILOSEC) 40 MG capsule TAKE 1 CAPSULE BY MOUTH IN THE MORNING AND AT BEDTIME. 60 capsule 5   Polyethyl Glycol-Propyl Glycol (SYSTANE OP) Place 1 drop into both eyes daily as needed (irritation).     Probiotic Product (PROBIOTIC PO) Take 1 capsule by mouth daily.     vitamin B-12 (CYANOCOBALAMIN) 500 MCG tablet Take 500 mcg by mouth daily.     zolpidem (AMBIEN) 10 MG tablet Take 10 mg by mouth at bedtime.      No facility-administered medications prior to visit.    Allergies  Allergen Reactions   Claritin [Loratadine]     Affects eyes    Codeine     Hyperactivity    Crestor [Rosuvastatin]     INEFFECTIVE AT LOWERING LDL   Lipitor [Atorvastatin]     INEFFECTIVE AT LOWERING LDL   Penicillins Hives   Simvastatin     ELEVATED LIVER ENZYMES   Sulfonamide Derivatives Nausea And Vomiting and Rash    Review of Systems  Constitutional:  Negative for chills and fever.  Eyes:  Negative for photophobia and pain.  Respiratory:  Negative for shortness of breath.   Gastrointestinal:  Negative for abdominal pain, constipation, diarrhea, nausea and vomiting.  Musculoskeletal:  Negative for joint pain, myalgias and neck pain.  Skin:  Positive for itching and rash.  Neurological:  Positive for  headaches. Negative for dizziness, sensory change, focal weakness and weakness.       Objective:    Physical Exam Constitutional:      General: She is not in acute distress.    Appearance: She is not ill-appearing.  HENT:     Mouth/Throat:     Mouth: Mucous membranes are moist.     Pharynx: Oropharynx is clear.  Eyes:     Extraocular Movements: Extraocular movements intact.     Conjunctiva/sclera: Conjunctivae normal.  Cardiovascular:     Rate and Rhythm: Normal rate.  Pulmonary:     Effort: Pulmonary effort is normal.  Musculoskeletal:     Cervical back: Normal range of motion and neck supple. No tenderness.  Skin:    General: Skin is warm and dry.     Comments: Right anterior abdomen with a 0.5 cm x 1 cm  red, flat, pruritic area. Right posterior knee with a raised nodule, and a central crusting area, pruritic. No surrounding target lesion. No sign of secondary bacterial infection.  No rash on palms or soles of feet.   Neurological:     General: No focal deficit present.     Mental Status: She is alert and oriented to person, place, and time.     Cranial Nerves: No cranial nerve deficit.     Sensory: No sensory deficit.     Motor: No weakness.     Coordination: Coordination normal.  Psychiatric:        Mood and  Affect: Mood normal.        Behavior: Behavior normal.        Thought Content: Thought content normal.      BP 126/72 (BP Location: Left Arm, Patient Position: Sitting, Cuff Size: Large)   Pulse 80   Temp 97.6 F (36.4 C) (Temporal)   Ht 5\' 6"  (1.676 m)   Wt 167 lb (75.8 kg)   SpO2 98%   BMI 26.95 kg/m  Wt Readings from Last 3 Encounters:  06/09/22 167 lb (75.8 kg)  12/20/21 170 lb (77.1 kg)  11/22/21 165 lb 8 oz (75.1 kg)        Assessment & Plan:   Problem List Items Addressed This Visit   None Visit Diagnoses     Tick bite of right knee, initial encounter    -  Primary   Relevant Medications   triamcinolone (KENALOG) 0.025 % ointment    mupirocin ointment (BACTROBAN) 2 %   Insect bite (nonvenomous) of abdominal wall, initial encounter       Relevant Medications   triamcinolone (KENALOG) 0.025 % ointment      Discussed that she appears to be having a localized reaction at this point from both the insect and tick bite. Use triamcinolone topical.  Discussed CDC guidelines for treating for tickborne illness.  Counseling on red flag symptoms and when to seek immediate medical attention.  Recommend preventing tick and insect bites in the future.   I am having Yomira E. Shorts start on triamcinolone and mupirocin ointment. I am also having her maintain her fluticasone, zolpidem, clonazePAM, Omega-3, ezetimibe, aspirin EC, Probiotic Product (PROBIOTIC PO), magnesium gluconate, vitamin C, Multiple Vitamins-Minerals (ADULT GUMMY PO), vitamin B-12, Calcium-Magnesium-Zinc (CAL-MAG-ZINC PO), Ferrous Sulfate (IRON PO), niacin, Hair Skin Nails, METAMUCIL FIBER PO, Polyethyl Glycol-Propyl Glycol (SYSTANE OP), Carboxymethylcellulose Sodium (THERATEARS OP), ibuprofen, famotidine, omeprazole, Repatha SureClick, and methocarbamol.  Meds ordered this encounter  Medications   triamcinolone (KENALOG) 0.025 % ointment    Sig: Apply 1 Application topically 2 (two) times daily.    Dispense:  30 g    Refill:  0    Order Specific Question:   Supervising Provider    Answer:   Hillard Danker A [[id number]]   mupirocin ointment (BACTROBAN) 2 %    Sig: Apply 1 Application topically 2 (two) times daily.    Dispense:  22 g    Refill:  0    Order Specific Question:   Supervising Provider    Answer:   Hillard Danker A [[id number]]

## 2022-06-14 ENCOUNTER — Other Ambulatory Visit (HOSPITAL_COMMUNITY): Payer: Self-pay

## 2022-06-22 ENCOUNTER — Other Ambulatory Visit: Payer: Self-pay | Admitting: Family Medicine

## 2022-06-22 ENCOUNTER — Other Ambulatory Visit (HOSPITAL_BASED_OUTPATIENT_CLINIC_OR_DEPARTMENT_OTHER): Payer: Self-pay | Admitting: Orthopaedic Surgery

## 2022-06-22 DIAGNOSIS — H04123 Dry eye syndrome of bilateral lacrimal glands: Secondary | ICD-10-CM | POA: Diagnosis not present

## 2022-06-22 DIAGNOSIS — W57XXXA Bitten or stung by nonvenomous insect and other nonvenomous arthropods, initial encounter: Secondary | ICD-10-CM

## 2022-06-22 DIAGNOSIS — S30861A Insect bite (nonvenomous) of abdominal wall, initial encounter: Secondary | ICD-10-CM

## 2022-06-22 DIAGNOSIS — H5213 Myopia, bilateral: Secondary | ICD-10-CM | POA: Diagnosis not present

## 2022-06-22 DIAGNOSIS — H2513 Age-related nuclear cataract, bilateral: Secondary | ICD-10-CM | POA: Diagnosis not present

## 2022-06-27 DIAGNOSIS — F4323 Adjustment disorder with mixed anxiety and depressed mood: Secondary | ICD-10-CM | POA: Diagnosis not present

## 2022-07-15 ENCOUNTER — Other Ambulatory Visit: Payer: Self-pay | Admitting: Family Medicine

## 2022-07-15 DIAGNOSIS — S80261A Insect bite (nonvenomous), right knee, initial encounter: Secondary | ICD-10-CM

## 2022-07-15 DIAGNOSIS — S30861A Insect bite (nonvenomous) of abdominal wall, initial encounter: Secondary | ICD-10-CM

## 2022-07-15 DIAGNOSIS — W57XXXA Bitten or stung by nonvenomous insect and other nonvenomous arthropods, initial encounter: Secondary | ICD-10-CM

## 2022-07-19 ENCOUNTER — Telehealth: Payer: Self-pay | Admitting: Cardiology

## 2022-07-19 NOTE — Telephone Encounter (Signed)
Patient states she wants to take Praluent.  She has long story of reasons but basically states she did not like the side effects orf the Repatha.  She states Praluent was approved and then denied.   She did pick up Repatha in January but did not take. She states multiple times she isnt taking it.  I stated I understand and she keeps repeating she will not take it due to family Hx of death and Side effects of this medication.  I advised understanding and I understand she wants Praluent and I will send this request over.  She then goes on about how expensive this is too and I advised that we will see if any patient assistance but if not is she willing to pay out of pocket as she repeats the expense multiple times.  She states "I don't know" and then starts to discuss Repatha again. I stopped patient at this time and advised will send a message for new RX for Praluent and she will get a call .  Call ended. Please advise

## 2022-07-19 NOTE — Telephone Encounter (Signed)
Pt c/o medication issue:  1. Name of Medication:  Praluent  Evolocumab (REPATHA SURECLICK) 140 MG/ML SOAJ   2. How are you currently taking this medication (dosage and times per day)? Has not started  3. Are you having a reaction (difficulty breathing--STAT)? no  4. What is your medication issue? Patient states she was prescribed the praluent by Dr. Antoine Poche and then was prescribed the Repatha by an APP. She says the pharmacist said there was no side effects to the repatha, but she says when she looked it up, it said it can cause hypertension and diabetes. She says [facility] okay[facility] and then they denied. She says the medications are only a $5 difference in cost. She states she does not want to take the repatha and wants to instead take the praluent. She also says she was supposed to come in for lab work this month, but is not sure when.

## 2022-07-21 NOTE — Telephone Encounter (Signed)
Spoke to patient she simply does not want to try Repatha as there is risk of elevation of BP and BG. Her family hx is positive for hypertension and T2DM.   Will apply for therapy exception.

## 2022-07-22 ENCOUNTER — Other Ambulatory Visit (HOSPITAL_COMMUNITY): Payer: Self-pay

## 2022-07-22 NOTE — Telephone Encounter (Signed)
What strength of Praluent are we submitting for?

## 2022-07-22 NOTE — Telephone Encounter (Signed)
Tried to submit on CMM for Praluent 150 MG/ML inj. Looks like someone has already done a PA for this and it was denied.    I did not see any documentation on the chart that a PA was done for Praluent but I did find the denial letter on chart media.  At this point appeal is required.   Please see full denial on chart media for appeal information.

## 2022-07-26 ENCOUNTER — Other Ambulatory Visit: Payer: Self-pay

## 2022-07-26 ENCOUNTER — Ambulatory Visit: Payer: Medicare HMO | Admitting: Internal Medicine

## 2022-07-26 ENCOUNTER — Encounter: Payer: Self-pay | Admitting: Internal Medicine

## 2022-07-26 VITALS — BP 106/76 | HR 82 | Temp 97.9°F | Resp 16 | Ht 66.0 in | Wt 175.1 lb

## 2022-07-26 DIAGNOSIS — H04123 Dry eye syndrome of bilateral lacrimal glands: Secondary | ICD-10-CM

## 2022-07-26 DIAGNOSIS — L237 Allergic contact dermatitis due to plants, except food: Secondary | ICD-10-CM

## 2022-07-26 DIAGNOSIS — J3089 Other allergic rhinitis: Secondary | ICD-10-CM

## 2022-07-26 MED ORDER — FLUTICASONE PROPIONATE 50 MCG/ACT NA SUSP: 2.0000 | Freq: Every day | NASAL | 5 refills | Status: DC | PRN

## 2022-07-26 MED ORDER — MOMETASONE FUROATE 0.1 % EX OINT: TOPICAL_OINTMENT | CUTANEOUS | 1 refills | Status: AC

## 2022-07-26 NOTE — Patient Instructions (Addendum)
Poison Ivy Dermatitis  - Use over the counter calamine lotion for relief.  - Apply prescribed topical steroid (mometasone 0.1% below neck)to flared areas  Taper off the topical steroids as the skin improves. Do not use topical steroid for more than 10-14 days at a time.  - Discussed no need for systemic steroids at this time due to limited nature of the rash and to avoid side effects with frequent oral prednisone.    Allergic Rhinitis: - Use nasal saline rinses before nose sprays such as with Neilmed Sinus Rinse.  Use distilled water.   - Use Flonase 1-2 sprays each nostril daily. Aim upward and outward.  Marvella E Beyl Follow up with Dr Lucie Leather around October 2024.

## 2022-07-26 NOTE — Progress Notes (Signed)
   FOLLOW UP Date of Service/Encounter:  07/26/22   Subjective:  Grace Taylor (DOB: Jan 06, 1960) is a 62 y.o. female who [facility] for follow up for an acute visit.   History obtained from: chart review and patient. Last seen by Dr. Lucie Leather 11/02/2021 for perennial allergic rhinitis, LPR, dry eye syndrome. On Omeprazole, Pepcid, Flonase.  Held off on oral anti histamine due to dry eyes.  Reports having an outbreak of poison ivy. Using a wash daily to help prevent spread.  Has a patch on L upper thigh and small areas on wrist and arms.  They are itchy and flared.  Using some leftover Mometasone cream that she has from prior which does help.  She is wondering if she needs oral prednisone or steroid shot.    Allergies are doing okay.  Denies much congestion, drainage, runny nose as long as she takes her Flonase.  Avoids anti histamines due to dry eye and other eye conditions, unclear if she has glaucoma.   Also reports being seen for tick bite 06/2022 by PCP. She removed the tick at home with tweezers.  Denies any outbreaks of rash afterwards.    Past Medical History: Past Medical History:  Diagnosis Date   Allergy    Anemia    Benign colon polyp 04/07/2017   Q 5 year recall   Chronic neck pain - post injury, worker's comp 2013, 2018 04/07/2017   Dr. Dion Saucier   Clostridium difficile infection    History of peptic ulcer 04/07/2017   Hyperlipidemia    IBS (irritable bowel syndrome) 04/07/2017   Colonoscopy by Leone Payor   Migraines    PTSD (post-traumatic stress disorder) 04/07/2017   Ulcer    age 20    Objective:  BP 106/76 (BP Location: Left Arm, Patient Position: Sitting, Cuff Size: Large)   Pulse 82   Temp 97.9 F (36.6 C) (Temporal)   Resp 16   Ht 5\' 6"  (1.676 m)   Wt 175 lb 1.6 oz (79.4 kg)   SpO2 98%   BMI 28.26 kg/m  Body mass index is 28.26 kg/m. Physical Exam: GEN: alert, well developed HEENT: clear conjunctiva,  nose with mild inferior turbinate hypertrophy, pink nasal mucosa, no rhinorrhea,no cobblestoning HEART: regular rate and rhythm, no murmur LUNGS: clear to auscultation bilaterally, no coughing, unlabored respiration SKIN: erythematous papular rash on L upper thigh, small areas on bl wrist.  Assessment:   1. Allergic contact dermatitis due to plants, except food   2. Perennial allergic rhinitis   3. Dry eye syndrome of both eyes     Plan/Recommendations:  Poison Ivy Dermatitis  - Use over the counter calamine lotion for relief.  - Apply prescribed topical steroid (mometasone 0.1% below neck)to flared areas  Taper off the topical steroids as the skin improves. Do not use topical steroid for more than 10-14 days at a time.  - Discussed no need for systemic steroids at this time due to limited nature of the rash and to avoid side effects with frequent oral prednisone.    Allergic Rhinitis: - Use nasal saline rinses before nose sprays such as with Neilmed Sinus Rinse.  Use distilled water.   - Use Flonase 1-2 sprays each nostril daily. Aim upward and outward.  Dry Eyes Syndrome - Avoiding all anti histamines.   Routine follow up in 10/2022.   Alesia Morin, MD [facility]

## 2022-07-31 ENCOUNTER — Other Ambulatory Visit (HOSPITAL_BASED_OUTPATIENT_CLINIC_OR_DEPARTMENT_OTHER): Payer: Self-pay | Admitting: Orthopaedic Surgery

## 2022-08-13 ENCOUNTER — Other Ambulatory Visit: Payer: Self-pay | Admitting: Cardiology

## 2022-08-31 NOTE — Telephone Encounter (Signed)
Attempted to resubmit for Praluent to confirm if appeal is still needed for request or if enough time has past now to try again for an approval.   Request is pending.   Key: [id number]

## 2022-09-01 ENCOUNTER — Telehealth: Payer: Self-pay | Admitting: Cardiology

## 2022-09-01 ENCOUNTER — Telehealth: Payer: Self-pay

## 2022-09-01 ENCOUNTER — Ambulatory Visit: Payer: Medicare HMO | Admitting: Cardiology

## 2022-09-01 ENCOUNTER — Other Ambulatory Visit: Payer: Self-pay

## 2022-09-01 DIAGNOSIS — E785 Hyperlipidemia, unspecified: Secondary | ICD-10-CM

## 2022-09-01 DIAGNOSIS — R931 Abnormal findings on diagnostic imaging of heart and coronary circulation: Secondary | ICD-10-CM | POA: Diagnosis not present

## 2022-09-01 NOTE — Telephone Encounter (Signed)
Patient came in for appointment  that was cancelled by provider. Went to do lab work. Wanting to speak to nurse regarding medications. Did not want to wait till next visit.

## 2022-09-01 NOTE — Telephone Encounter (Signed)
Patient came into the office for questions about her Repatha. Pt states she can not take this medication due to all the side effects it has on her musculoskeletal system. The pain is too much for her. Pt states she has extensive damage to her neck and spine. [employer] denied the medication. Pt would like to know where to go from here. Please advise.

## 2022-09-01 NOTE — Telephone Encounter (Signed)
Please see documentation entered.

## 2022-09-02 ENCOUNTER — Other Ambulatory Visit (HOSPITAL_COMMUNITY): Payer: Self-pay

## 2022-09-02 ENCOUNTER — Encounter: Payer: Self-pay | Admitting: Cardiology

## 2022-09-02 DIAGNOSIS — E785 Hyperlipidemia, unspecified: Secondary | ICD-10-CM

## 2022-09-02 LAB — LIPID PANEL
Chol/HDL Ratio: 4.4 ratio (ref 0.0–4.4)
Cholesterol, Total: 276 mg/dL — ABNORMAL HIGH (ref 100–199)
HDL: 63 mg/dL (ref >39)
LDL Chol Calc (NIH): 191 mg/dL — ABNORMAL HIGH (ref 0–99)
Triglycerides: 125 mg/dL (ref 0–149)
VLDL Cholesterol Cal: 22 mg/dL (ref 5–40)

## 2022-09-07 ENCOUNTER — Other Ambulatory Visit (HOSPITAL_COMMUNITY): Payer: Self-pay

## 2022-09-07 NOTE — Telephone Encounter (Signed)
Plan requested additional information, faxed info to plan on 09/02/22.   Status is still pending.

## 2022-09-07 NOTE — Telephone Encounter (Signed)
Still pending response

## 2022-09-08 ENCOUNTER — Other Ambulatory Visit (HOSPITAL_COMMUNITY): Payer: Self-pay

## 2022-09-08 MED ORDER — PRALUENT 75 MG/ML ~~LOC~~ SOAJ: 75.0000 mg | SUBCUTANEOUS | 2 refills | Status: DC

## 2022-09-08 NOTE — Telephone Encounter (Signed)
Please see other encounter.

## 2022-09-08 NOTE — Addendum Note (Signed)
Addended by: Tylene Fantasia on: 09/08/2022 04:44 PM   Modules accepted: Orders

## 2022-09-08 NOTE — Telephone Encounter (Addendum)
Call to inform about approval N/A. Does not let me leave VM. Sent MyChart see other encounter from 08/30.

## 2022-09-08 NOTE — Telephone Encounter (Signed)
Heard a beep but no message. LM to call office

## 2022-09-08 NOTE — Telephone Encounter (Signed)
Patient is returning call.  °

## 2022-09-08 NOTE — Telephone Encounter (Signed)
Pharmacy Patient Advocate Encounter  Received notification from [facility] that Prior Authorization for PRALUENT has been APPROVED from 01/03/22 to 01/03/23. Ran test claim, Copay is $100. This test claim was processed through [facility]- copay amounts may vary at other pharmacies due to pharmacy/plan contracts, or as the patient moves through the different stages of their insurance plan.

## 2022-09-12 ENCOUNTER — Encounter: Payer: Self-pay | Admitting: Internal Medicine

## 2022-09-12 MED ORDER — PRALUENT 75 MG/ML ~~LOC~~ SOAJ: 75.0000 mg | SUBCUTANEOUS | 3 refills | Status: DC

## 2022-09-12 NOTE — Addendum Note (Signed)
Addended by: Tylene Fantasia on: 09/12/2022 11:34 AM   Modules accepted: Orders

## 2022-09-14 MED ORDER — PRALUENT 150 MG/ML ~~LOC~~ SOAJ
150.0000 mg | SUBCUTANEOUS | 3 refills | Status: DC
  Filled 2023-03-14: qty 6, 84d supply, fill #0
  Filled 2023-04-19: qty 2, 28d supply, fill #0

## 2022-09-14 NOTE — Addendum Note (Signed)
Addended by: Tylene Fantasia on: 09/14/2022 11:26 AM   Modules accepted: Orders

## 2022-09-14 NOTE — Telephone Encounter (Signed)
Spoke to patient, she was on 150 mg Praluent so will change prescription to 150 mg every 14 days and will send prescription for 3 months with 3 refills.    [facility] to [facility]) to cancel Praluent 75 mg prescription and fill 150 mg Praluent for 3 months

## 2022-09-24 ENCOUNTER — Other Ambulatory Visit (HOSPITAL_BASED_OUTPATIENT_CLINIC_OR_DEPARTMENT_OTHER): Payer: Self-pay | Admitting: Orthopaedic Surgery

## 2022-10-04 ENCOUNTER — Encounter: Payer: Self-pay | Admitting: Allergy and Immunology

## 2022-10-04 ENCOUNTER — Ambulatory Visit: Payer: Medicare HMO | Admitting: Allergy and Immunology

## 2022-10-04 ENCOUNTER — Other Ambulatory Visit: Payer: Self-pay

## 2022-10-04 VITALS — BP 128/80 | HR 98 | Temp 97.7°F | Resp 16 | Ht 66.0 in | Wt 179.8 lb

## 2022-10-04 DIAGNOSIS — J3089 Other allergic rhinitis: Secondary | ICD-10-CM | POA: Diagnosis not present

## 2022-10-04 DIAGNOSIS — J3489 Other specified disorders of nose and nasal sinuses: Secondary | ICD-10-CM | POA: Diagnosis not present

## 2022-10-04 DIAGNOSIS — H04123 Dry eye syndrome of bilateral lacrimal glands: Secondary | ICD-10-CM | POA: Diagnosis not present

## 2022-10-04 DIAGNOSIS — K219 Gastro-esophageal reflux disease without esophagitis: Secondary | ICD-10-CM

## 2022-10-04 MED ORDER — FLUTICASONE PROPIONATE 50 MCG/ACT NA SUSP: NASAL | 5 refills | Status: DC

## 2022-10-04 MED ORDER — FAMOTIDINE 40 MG PO TABS: ORAL_TABLET | ORAL | 1 refills | Status: DC

## 2022-10-04 NOTE — Progress Notes (Unsigned)
[place] - [place] - [place] - [place] - [place]   Follow-up Note  Referring Provider: Myrlene Broker, * Primary Provider: Myrlene Broker, MD Date of Office Visit: 10/04/2022  Subjective:   Grace Taylor (DOB: Dec 25, 1960) is a 62 y.o. female who [facility] in re-evaluation of the following:  HPI: Grace Taylor returns to this clinic in evaluation of LPR, allergic rhinitis, septal perforation, dry eye syndrome.  I last saw in this clinic 02 November 2021.  For the most part she has done pretty well since I last seen her in this clinic with intermittent use of Flonase and it does not sound as though she is required a antibiotic to treat an episode of sinusitis.  She does not use any anal oral antihistamines because of a dry eye issue.  Her reflux appears to be okay although occasionally she does have some stomach upset and pain.  She is not using any therapy for reflux at this point in time because from what I can understand some of her treatment may have been causing problems with constipation but apparently she has fixed her constipation by taking magnesium.  She does not receive the flu vaccine.  Allergies as of 10/04/2022       Reactions   Claritin [loratadine]    Affects eyes    Codeine    Hyperactivity    Crestor [rosuvastatin]    INEFFECTIVE AT LOWERING LDL   Lipitor [atorvastatin]    INEFFECTIVE AT LOWERING LDL   Penicillins Hives   Simvastatin    ELEVATED LIVER ENZYMES   Sulfonamide Derivatives Nausea And Vomiting, Rash        Medication List    ADULT GUMMY PO Take 2 capsules by mouth daily.   Hair Skin Nails Caps Take 1 tablet by mouth daily.   aspirin EC 325 MG tablet Take 1 tablet (325 mg total) by mouth daily.   CAL-MAG-ZINC PO Take 1 tablet by mouth daily.   clonazePAM 0.5 MG tablet Commonly known as: KLONOPIN Take 0.5-1 tablets by mouth See admin instructions. Take 0.5 mg in  the morning and 1 mg in the evening   ezetimibe 10 MG tablet Commonly known as: ZETIA TAKE 1 TABLET BY MOUTH EVERY DAY   famotidine 40 MG tablet Commonly known as: PEPCID TAKE 1 TABLET BY MOUTH EVERY DAY IN THE EVENING   fluticasone 50 MCG/ACT nasal spray Commonly known as: FLONASE Place 2 sprays into both nostrils daily as needed for allergies or rhinitis.   ibuprofen 800 MG tablet Commonly known as: ADVIL TAKE 1 TABLET EVERY 8 HOURS FOR 10 DAYS. PLEASE TAKE WITH FOOD, PLEASE ALTERNATE WITH ACETAMINOPHEN   IRON PO Take 25 mg by mouth daily.   magnesium gluconate 500 MG tablet Commonly known as: MAGONATE Take 500 mg by mouth 2 (two) times daily.   METAMUCIL FIBER PO Take 4 capsules by mouth 2 (two) times daily.   methocarbamol 500 MG tablet Commonly known as: ROBAXIN Take 1 tablet (500 mg total) by mouth 4 (four) times daily.   mometasone 0.1 % ointment Commonly known as: ELOCON Apply twice daily for poison ivy flare up, maximum use 14 days.   mupirocin ointment 2 % Commonly known as: BACTROBAN Apply 1 Application topically 2 (two) times daily.   niacin 500 MG tablet Commonly known as: VITAMIN B3 Take 500 mg by mouth daily.   Omega-3 1000 MG Caps Take 2,000 mg by mouth daily.  omeprazole 40 MG capsule Commonly known as: PRILOSEC TAKE 1 CAPSULE BY MOUTH IN THE MORNING AND AT BEDTIME.   Praluent 150 MG/ML Soaj Generic drug: Alirocumab Inject 1 mL (150 mg total) into the skin every 14 (fourteen) days.   PROBIOTIC PO Take 1 capsule by mouth daily.   SYSTANE OP Place 1 drop into both eyes daily as needed (irritation).   THERATEARS OP Place 1 drop into both eyes daily as needed (irritation).   triamcinolone 0.025 % ointment Commonly known as: KENALOG Apply 1 Application topically 2 (two) times daily.   vitamin B-12 500 MCG tablet Commonly known as: CYANOCOBALAMIN Take 500 mcg by mouth daily.   vitamin C 1000 MG tablet Take 1,000 mg by mouth daily.    zolpidem 10 MG tablet Commonly known as: AMBIEN Take 10 mg by mouth at bedtime.    Past Medical History:  Diagnosis Date   Allergy    Anemia    Benign colon polyp 04/07/2017   Q 5 year recall   Chronic neck pain - post injury, worker's comp 2013, 2018 04/07/2017   Dr. Dion Saucier   Clostridium difficile infection    History of peptic ulcer 04/07/2017   Hyperlipidemia    IBS (irritable bowel syndrome) 04/07/2017   Colonoscopy by Leone Payor   Migraines    PTSD (post-traumatic stress disorder) 04/07/2017   Ulcer    age 89    Past Surgical History:  Procedure Laterality Date   CHOLECYSTECTOMY  2012   COLONOSCOPY  2014   DG GALL BLADDER     2012   ESOPHAGOGASTRODUODENOSCOPY  2014   FLEXIBLE SIGMOIDOSCOPY  03/2014   OVARIAN CYST REMOVAL     right shoulder surgery     SHOULDER ARTHROSCOPY WITH ROTATOR CUFF REPAIR AND OPEN BICEPS TENODESIS Left 11/22/2021   Procedure: LEFT SHOULDER ARTHROSCOPY WITH ROTATOR CUFF REPAIR AND BICEPS TENODESIS;  Surgeon: Huel Cote, MD;  Location: [facility] OR;  Service: Orthopedics;  Laterality: Left;   TMJ ARTHROSCOPY      Review of systems negative except as noted in HPI / PMHx or noted below:  Review of Systems  Constitutional: Negative.   HENT: Negative.    Eyes: Negative.   Respiratory: Negative.    Cardiovascular: Negative.   Gastrointestinal: Negative.   Genitourinary: Negative.   Musculoskeletal: Negative.   Skin: Negative.   Neurological: Negative.   Endo/Heme/Allergies: Negative.   Psychiatric/Behavioral: Negative.       Objective:   Vitals:   10/04/22 1142  BP: 128/80  Pulse: 98  Temp: 97.7 F (36.5 C)  SpO2: 98%      Weight: 179 lb 12.8 oz (81.6 kg)   Physical Exam Constitutional:      Appearance: She is not diaphoretic.  HENT:     Head: Normocephalic.     Right Ear: Tympanic membrane, ear canal and external ear normal.     Left Ear: Tympanic membrane, ear canal and external ear normal.     Nose: Mucosal edema  (septal perforation) present. No rhinorrhea.     Mouth/Throat:     Pharynx: Uvula midline. No oropharyngeal exudate.  Eyes:     Conjunctiva/sclera: Conjunctivae normal.  Neck:     Thyroid: No thyromegaly.     Trachea: Trachea normal. No tracheal tenderness or tracheal deviation.  Cardiovascular:     Rate and Rhythm: Normal rate and regular rhythm.     Heart sounds: Normal heart sounds, S1 normal and S2 normal. No murmur heard. Pulmonary:     Effort:  No respiratory distress.     Breath sounds: Normal breath sounds. No stridor. No wheezing or rales.  Lymphadenopathy:     Head:     Right side of head: No tonsillar adenopathy.     Left side of head: No tonsillar adenopathy.     Cervical: No cervical adenopathy.  Skin:    Findings: No erythema or rash.     Nails: There is no clubbing.  Neurological:     Mental Status: She is alert.     Diagnostics: none  Assessment and Plan:   1. Perennial allergic rhinitis   2. Nasal septal perforation   3. LPRD (laryngopharyngeal reflux disease)   4. Dry eye syndrome of both eyes    1.  Continue to Treat and prevent reflux:   A. Famotidine 40 mg -1 tablet 3-7 times per week  2.  Continue to treat and prevent inflammation of airway:  A. Nasal fluticasone - 1-2 sprays each nostril 3-7 times per week  3.  Influenza = Tamiflu, Covid = Paxlovid  4.  Return to clinic in 12 months or earlier if problem.   Grace Taylor has the option of using famotidine to address her reflux and LPR and she has the option of using some nasal fluticasone to address the inflammation of her upper airway.  And I have given her the name of Tamiflu and Paxlovid to use in case she is infected with influenza or COVID in the future if she is not going to obtain any vaccinations.  Assuming she does well with this plan I will see her back in this clinic in 12 months or earlier if there is a problem.  Laurette Schimke, MD Allergy / Immunology [facility]

## 2022-10-04 NOTE — Patient Instructions (Addendum)
  1.  Continue to Treat and prevent reflux:   A. Famotidine 40 mg -1 tablet 3-7 times per week  2.  Continue to treat and prevent inflammation of airway:  A. Nasal fluticasone - 1-2 sprays each nostril 3-7 times per week  3.  Influenza = Tamiflu, Covid = Paxlovid  4.  Return to clinic in 12 months or earlier if problem.

## 2022-10-05 ENCOUNTER — Encounter: Payer: Self-pay | Admitting: Allergy and Immunology

## 2022-10-06 ENCOUNTER — Other Ambulatory Visit: Payer: Self-pay | Admitting: Nurse Practitioner

## 2022-10-10 ENCOUNTER — Ambulatory Visit (AMBULATORY_SURGERY_CENTER): Payer: Medicare HMO | Admitting: *Deleted

## 2022-10-10 VITALS — Ht 66.0 in | Wt 175.0 lb

## 2022-10-10 DIAGNOSIS — Z860100 Personal history of colon polyps, unspecified: Secondary | ICD-10-CM

## 2022-10-10 MED ORDER — NA SULFATE-K SULFATE-MG SULF 17.5-3.13-1.6 GM/177ML PO SOLN
1.0000 | Freq: Once | ORAL | 0 refills | Status: DC
Start: 2022-10-10 — End: 2022-10-10

## 2022-10-10 NOTE — Addendum Note (Signed)
Addended by: Danton Sewer on: 10/10/2022 12:52 PM   Modules accepted: Orders

## 2022-10-10 NOTE — Progress Notes (Addendum)
Pt's name and DOB verified at the beginning of the pre-visit wit 2 identifiers  Pt denies any difficulty with ambulating,sitting, laying down or rolling side to side  Gave both [facility] main # and MD on call # prior to instructions.   No egg or soy allergy known to patient   No issues known to pt with past sedation with any surgeries or procedures  Pt denies having issues being intubated  Pt has no issues moving head neck or swallowing  No FH of Malignant Hyperthermia  Pt is not on diet pills or shots  Pt is not on home 02   Pt is not on blood thinners   Pt denies issues with constipation   Pt is not on dialysis  Pt denise any abnormal heart rhythms   Pt states she will have her yearly this month or Nov no testing  Pt encouraged to use to use Singlecare or Goodrx to reduce cost   Patient's chart reviewed by Cathlyn Parsons CNRA prior to pre-visit and patient appropriate for the [facility].  Pre-visit completed and red dot placed by patient's name on their procedure day (on provider's schedule).  .  Visit by phone  Pt states weight is 175 lb  Instructed pt why it is important to and  to call if they have any changes in health or new medications. Directed them to the # given and on instructions.     Instructions reviewed with pt and pt states understanding. Instructed to review again prior to procedure. Pt states they will.   Instructions sent by mail with coupon and by my chart

## 2022-10-25 DIAGNOSIS — F4323 Adjustment disorder with mixed anxiety and depressed mood: Secondary | ICD-10-CM | POA: Diagnosis not present

## 2022-10-26 NOTE — Progress Notes (Unsigned)
  Cardiology Office Note:   Date:  10/27/2022  ID:  Grace Taylor, DOB 1960/08/11, MRN [id number] PCP: Myrlene Broker, MD  [facility] Providers Cardiologist:  Rollene Rotunda, MD {  History of Present Illness:   Grace Taylor is a 62 y.o. female who presented in 2018 for evaluation of strong family history, dizziness, episodes of shortness of breath and some chest discomfort.   She had a negative POET (Plain Old Exercise Treadmill ) in 2018.  She had a calcium score of 35 which was 78th percentile. She had a negative perfusion study in 2022.     Since I last saw her she has done well.  She has not been doing much exercising but she has been remodeling homes.  She stays busy.  The patient denies any new symptoms such as chest discomfort, neck or arm discomfort. There has been no new shortness of breath, PND or orthopnea. There have been no reported palpitations, presyncope or syncope.  Her biggest complaint has been difficulty getting her Praluent.  ROS: As stated in the HPI and negative for all other systems.  Studies Reviewed:    EKG:   EKG Interpretation Date/Time:  Thursday October 27 2022 11:41:21 EDT Ventricular Rate:  74 PR Interval:  142 QRS Duration:  78 QT Interval:  370 QTC Calculation: 410 R Axis:   55  Text Interpretation: Normal sinus rhythm Nonspecific T wave abnormality When compared with ECG of 05-Apr-2009 03:39, No significant change was found Confirmed by Rollene Rotunda ([id number]) on 10/27/2022 1:06:40 PM    Risk Assessment/Calculations:              Physical Exam:   VS:  BP 118/74   Pulse 74   Ht 5\' 6"  (1.676 m)   Wt 181 lb 9.6 oz (82.4 kg)   SpO2 96%   BMI 29.31 kg/m    Wt Readings from Last 3 Encounters:  10/27/22 181 lb 9.6 oz (82.4 kg)  10/10/22 175 lb (79.4 kg)  10/04/22 179 lb 12.8 oz (81.6 kg)     GEN: Well nourished, well developed in no acute distress NECK: No JVD; No carotid bruits CARDIAC: RRR, no murmurs,  rubs, gallops RESPIRATORY:  Clear to auscultation without rales, wheezing or rhonchi  ABDOMEN: Soft, non-tender, non-distended EXTREMITIES:  No edema; No deformity   ASSESSMENT AND PLAN:   ELEVATED CORONARY CALCIUM: She has no symptoms.  We are going to continue to participate in aggressive risk reduction.  No further testing at this point.  Not changing therapies.  DYSLIPIDEMIA:    Previous LDL was most recently 191 but she has been on Praluent since then.  I am going to check a fasting lipid profile.  She has trouble getting this but has not been able to tolerate statins and actually says she does not tolerate Repatha.  If she fails this I will send her back to the [facility].      Follow up with me in one year.   Signed, Rollene Rotunda, MD

## 2022-10-27 ENCOUNTER — Ambulatory Visit: Payer: Medicare HMO | Attending: Cardiology | Admitting: Cardiology

## 2022-10-27 ENCOUNTER — Encounter: Payer: Self-pay | Admitting: Cardiology

## 2022-10-27 VITALS — BP 118/74 | HR 74 | Ht 66.0 in | Wt 181.6 lb

## 2022-10-27 DIAGNOSIS — R931 Abnormal findings on diagnostic imaging of heart and coronary circulation: Secondary | ICD-10-CM

## 2022-10-27 DIAGNOSIS — I493 Ventricular premature depolarization: Secondary | ICD-10-CM | POA: Diagnosis not present

## 2022-10-27 DIAGNOSIS — E785 Hyperlipidemia, unspecified: Secondary | ICD-10-CM | POA: Diagnosis not present

## 2022-10-27 NOTE — Patient Instructions (Signed)
Lab Work: FASTING LIPID PANEL. If you have labs (blood work) drawn today and your tests are completely normal, you will receive your results only by: MyChart Message (if you have MyChart) OR A paper copy in the mail If you have any lab test that is abnormal or we need to change your treatment, we will call you to review the results.     Follow-Up: At [facility], you and your health needs are our priority.  As part of our continuing mission to provide you with exceptional heart care, we have created designated Provider Care Teams.  These Care Teams include your primary Cardiologist (physician) and Advanced Practice Providers (APPs -  Physician Assistants and Nurse Practitioners) who all work together to provide you with the care you need, when you need it.  We recommend signing up for the patient portal called "MyChart".  Sign up information is provided on this After Visit Summary.  MyChart is used to connect with patients for Virtual Visits (Telemedicine).  Patients are able to view lab/test results, encounter notes, upcoming appointments, etc.  Non-urgent messages can be sent to your provider as well.   To learn more about what you can do with MyChart, go to [email or web address].    Your next appointment:   12 month(s)  Provider:   Rollene Rotunda, MD

## 2022-11-02 ENCOUNTER — Encounter: Payer: Self-pay | Admitting: Internal Medicine

## 2022-11-13 NOTE — Progress Notes (Deleted)
[facility] History and Physical   Primary Care Physician:  Myrlene Broker, MD   Reason for Procedure:  History of adenomatous colon polyp  Plan:    Colonoscopy     HPI: Grace Taylor is a 62 y.o. female status post removal of a diminutive adenoma in 2017.  She presents for a surveillance colonoscopy.   Past Medical History:  Diagnosis Date   Allergy    Anemia    Benign colon polyp 04/07/2017   Q 5 year recall   Blood transfusion without reported diagnosis    Chronic neck pain - post injury, worker's comp 2013, 2018 04/07/2017   Dr. Dion Saucier   Clostridium difficile infection    Glaucoma    History of peptic ulcer 04/07/2017   Hyperlipidemia    IBS (irritable bowel syndrome) 04/07/2017   Colonoscopy by Leone Payor   Migraines    PTSD (post-traumatic stress disorder) 04/07/2017   Ulcer    age 45    Past Surgical History:  Procedure Laterality Date   CHOLECYSTECTOMY  2012   COLONOSCOPY  2014   DG GALL BLADDER     2012   ESOPHAGOGASTRODUODENOSCOPY  2014   FLEXIBLE SIGMOIDOSCOPY  03/2014   OVARIAN CYST REMOVAL     right shoulder surgery     SHOULDER ARTHROSCOPY WITH ROTATOR CUFF REPAIR AND OPEN BICEPS TENODESIS Left 11/22/2021   Procedure: LEFT SHOULDER ARTHROSCOPY WITH ROTATOR CUFF REPAIR AND BICEPS TENODESIS;  Surgeon: Huel Cote, MD;  Location: [facility] OR;  Service: Orthopedics;  Laterality: Left;   TMJ ARTHROSCOPY      Prior to Admission medications   Medication Sig Start Date End Date Taking? Authorizing Provider  Alirocumab (PRALUENT) 150 MG/ML SOAJ Inject 1 mL (150 mg total) into the skin every 14 (fourteen) days. 09/14/22   Rollene Rotunda, MD  Ascorbic Acid (VITAMIN C) 1000 MG tablet Take 1,000 mg by mouth daily. Patient not taking: Reported on 10/10/2022    [provider]  aspirin EC 325 MG tablet Take 1 tablet (325 mg total) by mouth daily. Patient not taking: Reported on 10/10/2022 10/27/21   Huel Cote, MD   buPROPion (WELLBUTRIN XL) 150 MG 24 hr tablet  05/29/19   [provider]  Calcium-Magnesium-Zinc (CAL-MAG-ZINC PO) Take 1 tablet by mouth daily.    [provider]  Carboxymethylcellulose Sodium (THERATEARS OP) Place 1 drop into both eyes daily as needed (irritation).    [provider]  clonazePAM (KLONOPIN) 0.5 MG tablet Take 0.5-1 tablets by mouth See admin instructions. Take 0.5 mg in the morning and 1 mg in the evening 09/19/16   [provider]  ezetimibe (ZETIA) 10 MG tablet TAKE 1 TABLET BY MOUTH EVERY DAY 10/06/22   Lewayne Bunting, MD  famotidine (PEPCID) 40 MG tablet 1 tablet 3-7 times per week Patient not taking: Reported on 10/10/2022 10/04/22   Jessica Priest, MD  Ferrous Sulfate (IRON PO) Take 25 mg by mouth daily.    [provider]  fluticasone (FLONASE) 50 MCG/ACT nasal spray 1-2 sprays each nostril 3-7 times per week 10/04/22   Kozlow, Alvira Philips, MD  ibuprofen (ADVIL) 800 MG tablet TAKE 1 TABLET EVERY 8 HOURS FOR 10 DAYS. PLEASE TAKE WITH FOOD, PLEASE ALTERNATE WITH ACETAMINOPHEN 09/25/22   Huel Cote, MD  magnesium gluconate (MAGONATE) 500 MG tablet Take 500 mg by mouth 2 (two) times daily.    [provider]  METAMUCIL FIBER PO Take 4 capsules by mouth 2 (two) times daily.  [provider]  methocarbamol (ROBAXIN) 500 MG tablet Take 1 tablet (500 mg total) by mouth 4 (four) times daily. 05/18/22   Huel Cote, MD  mometasone (ELOCON) 0.1 % ointment Apply twice daily for poison ivy flare up, maximum use 14 days. Patient not taking: Reported on 10/10/2022 07/26/22   Birder Robson, MD  Multiple Vitamins-Minerals (ADULT GUMMY PO) Take 2 capsules by mouth daily.    [provider]  Multiple Vitamins-Minerals (HAIR SKIN NAILS) CAPS Take 1 tablet by mouth daily.    [provider]  mupirocin ointment (BACTROBAN) 2 % Apply 1 Application topically 2 (two) times daily. Patient not taking: Reported on  10/10/2022 06/09/22   Hetty Blend L, NP-C  niacin (VITAMIN B3) 500 MG tablet Take 500 mg by mouth daily. Patient not taking: Reported on 10/10/2022    [provider]  Omega-3 1000 MG CAPS Take 2,000 mg by mouth daily. Patient not taking: Reported on 10/10/2022    [provider]  omeprazole (PRILOSEC) 40 MG capsule TAKE 1 CAPSULE BY MOUTH IN THE MORNING AND AT BEDTIME. 03/31/22   Kozlow, Alvira Philips, MD  Polyethyl Glycol-Propyl Glycol (SYSTANE OP) Place 1 drop into both eyes daily as needed (irritation).    [provider]  Probiotic Product (PROBIOTIC PO) Take 1 capsule by mouth daily.    [provider]  triamcinolone (KENALOG) 0.025 % ointment Apply 1 Application topically 2 (two) times daily. Patient not taking: Reported on 10/10/2022 06/09/22   Avanell Shackleton, NP-C  vitamin B-12 (CYANOCOBALAMIN) 500 MCG tablet Take 500 mcg by mouth daily.    [provider]  zolpidem (AMBIEN) 10 MG tablet Take 10 mg by mouth at bedtime.  09/03/15   [provider]    Current Outpatient Medications  Medication Sig Dispense Refill   Alirocumab (PRALUENT) 150 MG/ML SOAJ Inject 1 mL (150 mg total) into the skin every 14 (fourteen) days. 6 mL 3   Ascorbic Acid (VITAMIN C) 1000 MG tablet Take 1,000 mg by mouth daily. (Patient not taking: Reported on 10/10/2022)     aspirin EC 325 MG tablet Take 1 tablet (325 mg total) by mouth daily. (Patient not taking: Reported on 10/10/2022) 30 tablet 0   buPROPion (WELLBUTRIN XL) 150 MG 24 hr tablet      Calcium-Magnesium-Zinc (CAL-MAG-ZINC PO) Take 1 tablet by mouth daily.     Carboxymethylcellulose Sodium (THERATEARS OP) Place 1 drop into both eyes daily as needed (irritation).     clonazePAM (KLONOPIN) 0.5 MG tablet Take 0.5-1 tablets by mouth See admin instructions. Take 0.5 mg in the morning and 1 mg in the evening  1   ezetimibe (ZETIA) 10 MG tablet TAKE 1 TABLET BY MOUTH EVERY DAY 60 tablet 0   famotidine (PEPCID) 40 MG  tablet 1 tablet 3-7 times per week (Patient not taking: Reported on 10/10/2022) 90 tablet 1   Ferrous Sulfate (IRON PO) Take 25 mg by mouth daily.     fluticasone (FLONASE) 50 MCG/ACT nasal spray 1-2 sprays each nostril 3-7 times per week 16 g 5   ibuprofen (ADVIL) 800 MG tablet TAKE 1 TABLET EVERY 8 HOURS FOR 10 DAYS. PLEASE TAKE WITH FOOD, PLEASE ALTERNATE WITH ACETAMINOPHEN 30 tablet 0   magnesium gluconate (MAGONATE) 500 MG tablet Take 500 mg by mouth 2 (two) times daily.     METAMUCIL FIBER PO Take 4 capsules by mouth 2 (two) times daily.     methocarbamol (ROBAXIN) 500 MG tablet Take 1 tablet (  500 mg total) by mouth 4 (four) times daily. 30 tablet 3   mometasone (ELOCON) 0.1 % ointment Apply twice daily for poison ivy flare up, maximum use 14 days. (Patient not taking: Reported on 10/10/2022) 45 g 1   Multiple Vitamins-Minerals (ADULT GUMMY PO) Take 2 capsules by mouth daily.     Multiple Vitamins-Minerals (HAIR SKIN NAILS) CAPS Take 1 tablet by mouth daily.     mupirocin ointment (BACTROBAN) 2 % Apply 1 Application topically 2 (two) times daily. (Patient not taking: Reported on 10/10/2022) 22 g 0   niacin (VITAMIN B3) 500 MG tablet Take 500 mg by mouth daily. (Patient not taking: Reported on 10/10/2022)     Omega-3 1000 MG CAPS Take 2,000 mg by mouth daily. (Patient not taking: Reported on 10/10/2022)     omeprazole (PRILOSEC) 40 MG capsule TAKE 1 CAPSULE BY MOUTH IN THE MORNING AND AT BEDTIME. 60 capsule 5   Polyethyl Glycol-Propyl Glycol (SYSTANE OP) Place 1 drop into both eyes daily as needed (irritation).     Probiotic Product (PROBIOTIC PO) Take 1 capsule by mouth daily.     triamcinolone (KENALOG) 0.025 % ointment Apply 1 Application topically 2 (two) times daily. (Patient not taking: Reported on 10/10/2022) 30 g 0   vitamin B-12 (CYANOCOBALAMIN) 500 MCG tablet Take 500 mcg by mouth daily.     zolpidem (AMBIEN) 10 MG tablet Take 10 mg by mouth at bedtime.      No current  facility-administered medications for this visit.    Allergies as of 11/14/2022 - Review Complete 10/27/2022  Allergen Reaction Noted   Claritin [loratadine]  11/11/2021   Codeine  08/26/2009   Crestor [rosuvastatin]  07/21/2020   Lipitor [atorvastatin]  07/21/2020   Penicillins Hives 08/26/2009   Simvastatin  07/21/2020   Sulfonamide derivatives Nausea And Vomiting and Rash 08/26/2009    Family History  Problem Relation Age of Onset   Hypertension Mother    Alcohol abuse Mother    Arthritis Mother    Cancer Mother    Heart disease Mother    Kidney disease Mother    Mental illness Mother    Hypertension Father    CAD Father 82       CABG    Hyperlipidemia Father    Heart disease Father    Early death Father    Heart attack Father    Hypertension Sister    Hyperlipidemia Sister    Arthritis Sister    Hypertension Brother    Alcohol abuse Brother    Heart disease Brother    Breast cancer Maternal Aunt    Cancer Maternal Aunt        breast cancer   Colon polyps Neg Hx    Colon cancer Neg Hx    Esophageal cancer Neg Hx    Stomach cancer Neg Hx    Rectal cancer Neg Hx     Social History   Socioeconomic History   Marital status: Divorced    Spouse name: Not on file   Number of children: 0   Years of education: some college   Highest education level: Bachelor's degree (e.g., BA, AB, BS)  Occupational History   Occupation: former Naval architect   Occupation: disabled  Tobacco Use   Smoking status: Never    Passive exposure: Never   Smokeless tobacco: Never  Vaping Use   Vaping status: Never Used  Substance and Sexual Activity   Alcohol use: Not Currently   Drug use: No  Sexual activity: Yes    Birth control/protection: Condom, Post-menopausal  Other Topics Concern   Not on file  Social History Narrative   Worked at the [employer], on SSD or SSI - but no health insurance   Social Determinants of Health   Financial Resource Strain: Medium Risk (06/07/2022)    Overall Financial Resource Strain (CARDIA)    Difficulty of Paying Living Expenses: Somewhat hard  Food Insecurity: No Food Insecurity (06/07/2022)   Hunger Vital Sign    Worried About Running Out of Food in the Last Year: Never true    Ran Out of Food in the Last Year: Never true  Transportation Needs: No Transportation Needs (06/07/2022)   PRAPARE - Administrator, Civil Service (Medical): No    Lack of Transportation (Non-Medical): No  Physical Activity: Sufficiently Active (06/07/2022)   Exercise Vital Sign    Days of Exercise per Week: 3 days    Minutes of Exercise per Session: 140 min  Stress: Stress Concern Present (06/07/2022)   [employer] of Occupational Health - Occupational Stress Questionnaire    Feeling of Stress : To some extent  Social Connections: Moderately Integrated (06/07/2022)   Social Connection and Isolation Panel [NHANES]    Frequency of Communication with Friends and Family: More than three times a week    Frequency of Social Gatherings with Friends and Family: More than three times a week    Attends Religious Services: More than 4 times per year    Active Member of [employer] or Organizations: Yes    Attends Banker Meetings: More than 4 times per year    Marital Status: Divorced  Intimate Partner Violence: Unknown (04/05/2021)   Received from [employer], [facility]   HITS    Physically Hurt: Not on file    Insult or Talk Down To: Not on file    Threaten Physical Harm: Not on file    Scream or Curse: Not on file    Review of Systems: Positive for *** All other review of systems negative except as mentioned in the HPI.  Physical Exam: Vital signs There were no vitals taken for this visit.  General:   Alert,  Well-developed, well-nourished, pleasant and cooperative in NAD Lungs:  Clear throughout to auscultation.   Heart:  Regular rate and rhythm; no murmurs, clicks, rubs,  or gallops. Abdomen:  Soft, nontender and  nondistended. Normal bowel sounds.   Neuro/Psych:  Alert and cooperative. Normal mood and affect. A and O x 3   @Selia Wareing  Sena Slate, MD, [facility] [phone] (pager) 11/13/2022 2:13 PM@

## 2022-11-14 ENCOUNTER — Telehealth: Payer: Self-pay | Admitting: Internal Medicine

## 2022-11-14 ENCOUNTER — Encounter: Payer: Medicare HMO | Admitting: Internal Medicine

## 2022-11-14 NOTE — Telephone Encounter (Signed)
Noted  

## 2022-11-14 NOTE — Telephone Encounter (Signed)
Go

## 2022-11-14 NOTE — Telephone Encounter (Signed)
Good Morning Dr. Leone Payor,  I called this patient at 10:15am to see if she was coming for her procedure. She stated that a family member was in the hospital and she need to help them and could not come for her procedure.  She also stated she will call to reschedule.   I will NO SHOW her.- [address]

## 2022-11-21 ENCOUNTER — Telehealth: Payer: Self-pay | Admitting: Pharmacist

## 2022-11-21 DIAGNOSIS — E785 Hyperlipidemia, unspecified: Secondary | ICD-10-CM

## 2022-11-21 NOTE — Telephone Encounter (Signed)
Praluent restarted Sept 2024, f/u lipid lab is due call patient to made aware. Patient will go to [facility] office tomorrow (11/21/2022)

## 2022-11-22 ENCOUNTER — Other Ambulatory Visit (HOSPITAL_BASED_OUTPATIENT_CLINIC_OR_DEPARTMENT_OTHER): Payer: Self-pay | Admitting: Orthopaedic Surgery

## 2022-11-22 DIAGNOSIS — E785 Hyperlipidemia, unspecified: Secondary | ICD-10-CM | POA: Diagnosis not present

## 2022-11-23 LAB — LIPID PANEL
Chol/HDL Ratio: 2.9 ratio (ref 0.0–4.4)
Cholesterol, Total: 182 mg/dL (ref 100–199)
HDL: 62 mg/dL (ref >39)
LDL Chol Calc (NIH): 106 mg/dL — ABNORMAL HIGH (ref 0–99)
Triglycerides: 75 mg/dL (ref 0–149)
VLDL Cholesterol Cal: 14 mg/dL (ref 5–40)

## 2022-11-24 ENCOUNTER — Telehealth: Payer: Self-pay | Admitting: Pharmacist

## 2022-11-24 NOTE — Telephone Encounter (Signed)
Lipid lab discussed, appointment with PharmD scheduled at [facility] location for Dec 31 at 1:30

## 2022-12-16 ENCOUNTER — Other Ambulatory Visit: Payer: Self-pay | Admitting: Cardiology

## 2022-12-26 ENCOUNTER — Telehealth: Payer: Self-pay | Admitting: Allergy and Immunology

## 2022-12-26 NOTE — Telephone Encounter (Signed)
Patient called and is requesting an ENT Referral to be placed as she been having pressure in her ears for over a week. She feels as that she might have some wax build up as she can't hear very well. She is requesting it to be in [place].

## 2023-01-03 ENCOUNTER — Ambulatory Visit: Payer: Medicare HMO

## 2023-01-03 NOTE — Progress Notes (Deleted)
 Office Visit    Patient Name: Grace Taylor Date of Encounter: 01/03/2023  Primary Care Provider:  Rollene Taylor LABOR, MD Primary Cardiologist:  Grace Schilling, MD  Chief Complaint    Hyperlipidemia - familial  Significant Past Medical History   CAD CAC =  35  (78th percentile)                 Allergies  Allergen Reactions   Claritin [Loratadine]     Affects eyes    Codeine     Hyperactivity    Crestor [Rosuvastatin]     INEFFECTIVE AT LOWERING LDL   Lipitor [Atorvastatin ]     INEFFECTIVE AT LOWERING LDL   Penicillins Hives   Simvastatin     ELEVATED LIVER ENZYMES   Sulfonamide Derivatives Nausea And Vomiting and Rash    History of Present Illness    Grace Taylor is a 62 y.o. female patient of Dr Taylor, in the office today to discuss options for cholesterol management.   LDL was 235 in 05/2020  Insurance Carrier: [employer]  LDL Cholesterol goal:  LDL <70  Current Medications:   ezetimibe  10 mg, alirocumab  150 mg  Previously tried:  atorvastatin , rosuvastatin -  didn't lower LDL  Simvastatin - elevated LFT's  Family Hx:     Social Hx: Tobacco: Alcohol :      Diet:      Exercise:    Accessory Clinical Findings   Lab Results  Component Value Date   CHOL 182 11/22/2022   HDL 62 11/22/2022   LDLCALC 106 (H) 11/22/2022   TRIG 75 11/22/2022   CHOLHDL 2.9 11/22/2022    No results found for: LIPOA  Lab Results  Component Value Date   ALT 37 (H) 10/24/2018   AST 31 10/24/2018   ALKPHOS 120 08/10/2016   BILITOT 0.5 10/24/2018   Lab Results  Component Value Date   CREATININE 0.73 06/16/2020   BUN 9 06/16/2020   NA 141 06/16/2020   K 4.8 06/16/2020   CL 104 06/16/2020   CO2 23 06/16/2020   Lab Results  Component Value Date   HGBA1C 5.2 06/16/2020    Home Medications    Current Outpatient Medications  Medication Sig Dispense Refill   Alirocumab  (PRALUENT ) 150 MG/ML SOAJ Inject 1 mL (150 mg  total) into the skin every 14 (fourteen) days. 6 mL 3   Ascorbic Acid (VITAMIN C) 1000 MG tablet Take 1,000 mg by mouth daily. (Patient not taking: Reported on 10/10/2022)     aspirin  EC 325 MG tablet Take 1 tablet (325 mg total) by mouth daily. (Patient not taking: Reported on 10/10/2022) 30 tablet 0   buPROPion (WELLBUTRIN XL) 150 MG 24 hr tablet      Calcium -Magnesium-Zinc (CAL-MAG-ZINC PO) Take 1 tablet by mouth daily.     Carboxymethylcellulose Sodium (THERATEARS OP) Place 1 drop into both eyes daily as needed (irritation).     clonazePAM (KLONOPIN) 0.5 MG tablet Take 0.5-1 tablets by mouth See admin instructions. Take 0.5 mg in the morning and 1 mg in the evening  1   ezetimibe  (ZETIA ) 10 MG tablet TAKE 1 TABLET BY MOUTH EVERY DAY 90 tablet 3   famotidine  (PEPCID ) 40 MG tablet 1 tablet 3-7 times per week (Patient not taking: Reported on 10/10/2022) 90 tablet 1   Ferrous Sulfate (IRON PO) Take 25 mg by mouth daily.     fluticasone  (FLONASE ) 50 MCG/ACT nasal spray 1-2 sprays each nostril 3-7 times per week 16  g 5   ibuprofen  (ADVIL ) 800 MG tablet TAKE 1 TABLET EVERY 8 HOURS FOR 10 DAYS. PLEASE TAKE WITH FOOD, PLEASE ALTERNATE WITH ACETAMINOPHEN  30 tablet 0   magnesium gluconate (MAGONATE) 500 MG tablet Take 500 mg by mouth 2 (two) times daily.     METAMUCIL FIBER PO Take 4 capsules by mouth 2 (two) times daily.     methocarbamol  (ROBAXIN ) 500 MG tablet Take 1 tablet (500 mg total) by mouth 4 (four) times daily. 30 tablet 3   mometasone  (ELOCON ) 0.1 % ointment Apply twice daily for poison ivy flare up, maximum use 14 days. (Patient not taking: Reported on 10/10/2022) 45 g 1   Multiple Vitamins-Minerals (ADULT GUMMY PO) Take 2 capsules by mouth daily.     Multiple Vitamins-Minerals (HAIR SKIN NAILS) CAPS Take 1 tablet by mouth daily.     mupirocin  ointment (BACTROBAN ) 2 % Apply 1 Application topically 2 (two) times daily. (Patient not taking: Reported on 10/10/2022) 22 g 0   niacin (VITAMIN B3) 500  MG tablet Take 500 mg by mouth daily. (Patient not taking: Reported on 10/10/2022)     Omega-3 1000 MG CAPS Take 2,000 mg by mouth daily. (Patient not taking: Reported on 10/10/2022)     omeprazole  (PRILOSEC) 40 MG capsule TAKE 1 CAPSULE BY MOUTH IN THE MORNING AND AT BEDTIME. 60 capsule 5   Polyethyl Glycol-Propyl Glycol (SYSTANE OP) Place 1 drop into both eyes daily as needed (irritation).     Probiotic Product (PROBIOTIC PO) Take 1 capsule by mouth daily.     triamcinolone  (KENALOG ) 0.025 % ointment Apply 1 Application topically 2 (two) times daily. (Patient not taking: Reported on 10/10/2022) 30 g 0   vitamin B-12 (CYANOCOBALAMIN ) 500 MCG tablet Take 500 mcg by mouth daily.     zolpidem (AMBIEN) 10 MG tablet Take 10 mg by mouth at bedtime.      No current facility-administered medications for this visit.     Assessment & Plan    No problem-specific Assessment & Plan notes found for this encounter.   Dennisha Mouser, PharmD CPP [facility] [address]  [place], [place] [address] [phone]  01/03/2023, 9:58 AM

## 2023-01-16 ENCOUNTER — Telehealth (INDEPENDENT_AMBULATORY_CARE_PROVIDER_SITE_OTHER): Payer: Self-pay | Admitting: Otolaryngology

## 2023-01-16 NOTE — Telephone Encounter (Signed)
 Confirmed appt & location [id number]

## 2023-01-17 ENCOUNTER — Ambulatory Visit (INDEPENDENT_AMBULATORY_CARE_PROVIDER_SITE_OTHER): Payer: Medicare HMO

## 2023-01-17 ENCOUNTER — Encounter (INDEPENDENT_AMBULATORY_CARE_PROVIDER_SITE_OTHER): Payer: Self-pay

## 2023-01-17 VITALS — BP 120/81 | HR 87 | Ht 66.0 in | Wt 170.0 lb

## 2023-01-17 DIAGNOSIS — H9011 Conductive hearing loss, unilateral, right ear, with unrestricted hearing on the contralateral side: Secondary | ICD-10-CM

## 2023-01-17 DIAGNOSIS — H6121 Impacted cerumen, right ear: Secondary | ICD-10-CM

## 2023-01-18 DIAGNOSIS — H6121 Impacted cerumen, right ear: Secondary | ICD-10-CM | POA: Insufficient documentation

## 2023-01-18 DIAGNOSIS — H9011 Conductive hearing loss, unilateral, right ear, with unrestricted hearing on the contralateral side: Secondary | ICD-10-CM | POA: Insufficient documentation

## 2023-01-18 NOTE — Progress Notes (Signed)
 Patient ID: Grace Taylor, female   DOB: 08-30-1960, 63 y.o.   MRN: [id number]  CC: Right ear hearing loss, right ear pressure  HPI:  Grace Taylor is a 63 y.o. female who presents today complaining of decreased right ear hearing for the past month.  She has also noted increasing right ear pressure.  The patient has no recent history of otitis media or otitis externa.  She also denies any recent upper respiratory infection.  She has a history of environmental allergies.  Currently she is seeing Dr. Tiana.  However, she is not using any allergy medications due to her glaucoma and retina issues.  She has no previous ENT surgery.  Past Medical History:  Diagnosis Date   Allergy    Anemia    Benign colon polyp 04/07/2017   Q 5 year recall   Blood transfusion without reported diagnosis    Chronic neck pain - post injury, worker's comp 2013, 2018 04/07/2017   Dr. Josefina   Clostridium difficile infection    Glaucoma    History of peptic ulcer 04/07/2017   Hyperlipidemia    IBS (irritable bowel syndrome) 04/07/2017   Colonoscopy by [facility]ram   Migraines    PTSD (post-traumatic stress disorder) 04/07/2017   Ulcer    age 89    Past Surgical History:  Procedure Laterality Date   CHOLECYSTECTOMY  2012   COLONOSCOPY  2014   DG GALL BLADDER     2012   ESOPHAGOGASTRODUODENOSCOPY  2014   FLEXIBLE SIGMOIDOSCOPY  03/2014   OVARIAN CYST REMOVAL     right shoulder surgery     SHOULDER ARTHROSCOPY WITH ROTATOR CUFF REPAIR AND OPEN BICEPS TENODESIS Left 11/22/2021   Procedure: LEFT SHOULDER ARTHROSCOPY WITH ROTATOR CUFF REPAIR AND BICEPS TENODESIS;  Surgeon: Genelle Standing, MD;  Location: [facility] OR;  Service: Orthopedics;  Laterality: Left;   TMJ ARTHROSCOPY      Family History  Problem Relation Age of Onset   Hypertension Mother    Alcohol  abuse Mother    Arthritis Mother    Cancer Mother    Heart disease Mother    Kidney disease Mother    Mental illness Mother     Hypertension Father    CAD Father 27       CABG    Hyperlipidemia Father    Heart disease Father    Early death Father    Heart attack Father    Hypertension Sister    Hyperlipidemia Sister    Arthritis Sister    Hypertension Brother    Alcohol  abuse Brother    Heart disease Brother    Breast cancer Maternal Aunt    Cancer Maternal Aunt        breast cancer   Colon polyps Neg Hx    Colon cancer Neg Hx    Esophageal cancer Neg Hx    Stomach cancer Neg Hx    Rectal cancer Neg Hx     Social History:  reports that she has never smoked. She has never been exposed to tobacco smoke. She has never used smokeless tobacco. She reports that she does not currently use alcohol . She reports that she does not use drugs.  Allergies:  Allergies  Allergen Reactions   Claritin [Loratadine]     Affects eyes    Codeine     Hyperactivity    Crestor [Rosuvastatin]     INEFFECTIVE AT LOWERING LDL   Lipitor [Atorvastatin ]     INEFFECTIVE AT LOWERING LDL  Penicillins Hives   Simvastatin     ELEVATED LIVER ENZYMES   Sulfonamide Derivatives Nausea And Vomiting and Rash    Prior to Admission medications   Medication Sig Start Date End Date Taking? Authorizing Provider  Alirocumab  (PRALUENT ) 150 MG/ML SOAJ Inject 1 mL (150 mg total) into the skin every 14 (fourteen) days. 09/14/22   Lavona Agent, MD  Ascorbic Acid (VITAMIN C) 1000 MG tablet Take 1,000 mg by mouth daily. Patient not taking: Reported on 10/10/2022    [provider]  aspirin  EC 325 MG tablet Take 1 tablet (325 mg total) by mouth daily. Patient not taking: Reported on 10/10/2022 10/27/21   Genelle Standing, MD  buPROPion (WELLBUTRIN XL) 150 MG 24 hr tablet  05/29/19   [provider]  Calcium -Magnesium-Zinc (CAL-MAG-ZINC PO) Take 1 tablet by mouth daily.    [provider]  Carboxymethylcellulose Sodium (THERATEARS OP) Place 1 drop into both eyes daily as needed (irritation).    [provider]   clonazePAM (KLONOPIN) 0.5 MG tablet Take 0.5-1 tablets by mouth See admin instructions. Take 0.5 mg in the morning and 1 mg in the evening 09/19/16   [provider]  ezetimibe  (ZETIA ) 10 MG tablet TAKE 1 TABLET BY MOUTH EVERY DAY 12/16/22   Lavona Agent, MD  famotidine  (PEPCID ) 40 MG tablet 1 tablet 3-7 times per week Patient not taking: Reported on 10/10/2022 10/04/22   Kozlow, Eric J, MD  Ferrous Sulfate (IRON PO) Take 25 mg by mouth daily.    [provider]  fluticasone  (FLONASE ) 50 MCG/ACT nasal spray 1-2 sprays each nostril 3-7 times per week 10/04/22   Kozlow, Camellia PARAS, MD  ibuprofen  (ADVIL ) 800 MG tablet TAKE 1 TABLET EVERY 8 HOURS FOR 10 DAYS. PLEASE TAKE WITH FOOD, PLEASE ALTERNATE WITH ACETAMINOPHEN  11/23/22   Genelle Standing, MD  magnesium gluconate (MAGONATE) 500 MG tablet Take 500 mg by mouth 2 (two) times daily.    [provider]  METAMUCIL FIBER PO Take 4 capsules by mouth 2 (two) times daily.    [provider]  methocarbamol  (ROBAXIN ) 500 MG tablet Take 1 tablet (500 mg total) by mouth 4 (four) times daily. 05/18/22   Genelle Standing, MD  mometasone  (ELOCON ) 0.1 % ointment Apply twice daily for poison ivy flare up, maximum use 14 days. Patient not taking: Reported on 10/10/2022 07/26/22   Tobie Arleta SQUIBB, MD  Multiple Vitamins-Minerals (ADULT GUMMY PO) Take 2 capsules by mouth daily.    [provider]  Multiple Vitamins-Minerals (HAIR SKIN NAILS) CAPS Take 1 tablet by mouth daily.    [provider]  mupirocin  ointment (BACTROBAN ) 2 % Apply 1 Application topically 2 (two) times daily. Patient not taking: Reported on 10/10/2022 06/09/22   Lendia Nordmann L, NP-C  niacin (VITAMIN B3) 500 MG tablet Take 500 mg by mouth daily. Patient not taking: Reported on 10/10/2022    [provider]  Omega-3 1000 MG CAPS Take 2,000 mg by mouth daily. Patient not taking: Reported on 10/10/2022    [provider]  omeprazole   (PRILOSEC) 40 MG capsule TAKE 1 CAPSULE BY MOUTH IN THE MORNING AND AT BEDTIME. 03/31/22   Kozlow, Camellia PARAS, MD  Polyethyl Glycol-Propyl Glycol (SYSTANE OP) Place 1 drop into both eyes daily as needed (irritation).    [provider]  Probiotic Product (PROBIOTIC PO) Take 1 capsule by mouth daily.    [provider]  triamcinolone  (KENALOG ) 0.025 % ointment Apply 1 Application topically 2 (two) times  daily. Patient not taking: Reported on 10/10/2022 06/09/22   Lendia Nordmann L, NP-C  vitamin B-12 (CYANOCOBALAMIN ) 500 MCG tablet Take 500 mcg by mouth daily.    [provider]  zolpidem (AMBIEN) 10 MG tablet Take 10 mg by mouth at bedtime.  09/03/15   [provider]    Blood pressure 120/81, pulse 87, height 5' 6 (1.676 m), weight 170 lb (77.1 kg). Exam: General: Communicates without difficulty, well nourished, no acute distress. Head: Normocephalic, no evidence injury, no tenderness, facial buttresses intact without stepoff. Face/sinus: No tenderness to palpation and percussion. Facial movement is normal and symmetric. Eyes: PERRL, EOMI. No scleral icterus, conjunctivae clear. Neuro: CN II exam reveals vision grossly intact.  No nystagmus at any point of gaze. Ears: Auricles well formed without lesions.  Right ear cerumen impaction.  The left ear canal and tympanic membrane are normal.  Nose: External evaluation reveals normal support and skin without lesions.  Dorsum is intact.  Anterior rhinoscopy reveals congested mucosa over anterior aspect of inferior turbinates and intact septum.  No purulence noted. Oral:  Oral cavity and oropharynx are intact, symmetric, without erythema or edema.  Mucosa is moist without lesions. Neck: Full range of motion without pain.  There is no significant lymphadenopathy.  No masses palpable.  Thyroid  bed within normal limits to palpation.  Parotid glands and submandibular glands equal bilaterally without mass.  Trachea is midline. Neuro:  CN  2-12 grossly intact.   Procedure: Right ear cerumen disimpaction Anesthesia: None Description: Under the operating microscope, the cerumen is carefully removed with a combination of cerumen currette, alligator forceps, and suction catheters.  After the cerumen is removed, the TMs are noted to be normal.  No mass, erythema, or lesions. The patient tolerated the procedure well.    Assessment: 1.  Right ear cerumen impaction, causing transient right ear conductive hearing loss. 2.  After the cerumen disimpaction procedure, the right tympanic membrane and middle ear space are noted to be normal.  The patient reports significant improvement in her hearing. 3.  The rest of her ENT exam is normal.  Plan: 1.  Otomicroscopy with right ear cerumen disimpaction. 2.  The physical exam findings are reviewed with the patient. 3.  The patient is instructed not to use Q-tips to clean the ear canals. 4.  The patient is encouraged to call with any questions or concerns.  Raine Blodgett W Garrus Gauthreaux 01/18/2023, 8:26 AM

## 2023-01-19 DIAGNOSIS — F4323 Adjustment disorder with mixed anxiety and depressed mood: Secondary | ICD-10-CM | POA: Diagnosis not present

## 2023-02-13 ENCOUNTER — Ambulatory Visit: Payer: Medicare HMO | Attending: Cardiology | Admitting: Pharmacist Clinician (PhC)/ Clinical Pharmacy Specialist

## 2023-02-13 DIAGNOSIS — E785 Hyperlipidemia, unspecified: Secondary | ICD-10-CM

## 2023-02-13 NOTE — Patient Instructions (Signed)
 Your Results:             Your most recent labs Goal  Total Cholesterol 182 < 200  Triglycerides 75 < 150  HDL (happy/good cholesterol) 62 > 40  LDL (lousy/bad cholesterol 106 < 70   Medication changes:  Continue with your Praluent  150 mg every 2 weeks.   Lab orders:  Go to the lab in the next week or two to get your cholesterol checked.     Thank you for choosing [facility]

## 2023-02-13 NOTE — Progress Notes (Signed)
Office Visit    Patient Name: Grace Taylor Date of Encounter: 02/14/2023  Primary Care Provider:  Myrlene Broker, MD Primary Cardiologist:  Rollene Rotunda, MD  Chief Complaint    Hyperlipidemia   Significant Past Medical History   CAD CAC = 35 (78th percentile)                 Allergies  Allergen Reactions   Claritin [Loratadine]     Affects eyes    Codeine     Hyperactivity    Crestor [Rosuvastatin]     INEFFECTIVE AT LOWERING LDL   Lipitor [Atorvastatin]     INEFFECTIVE AT LOWERING LDL   Penicillins Hives   Simvastatin     ELEVATED LIVER ENZYMES   Sulfonamide Derivatives Nausea And Vomiting and Rash    History of Present Illness    Grace Taylor is a 63 y.o. female patient of Dr Antoine Poche, in the office today to discuss options for cholesterol management.  She is taking Praluent 150 mg regularly.  Was intolerant to statins and refused to consider Repatha, as she felt it has too many side effects.  She was previously also on ezetimibe, believes it caused her to be dizzy.  LDL dropped from 191 to 106 on Praluent.  Insurance Carrier:  [employer]  LDL Cholesterol goal:  LDL < 70  Current Medications:  alirocumab 150 mg q14d  Previously tried:  rosuvastatin, atorvastatin, simvastatin  Family Hx: both parents with heart disease, siblings with hypertension  Social Hx: Tobacco: no Alcohol: no  Accessory Clinical Findings   Lab Results  Component Value Date   CHOL 182 11/22/2022   HDL 62 11/22/2022   LDLCALC 106 (H) 11/22/2022   TRIG 75 11/22/2022   CHOLHDL 2.9 11/22/2022    No results found for: "LIPOA"  Lab Results  Component Value Date   ALT 37 (H) 10/24/2018   AST 31 10/24/2018   ALKPHOS 120 08/10/2016   BILITOT 0.5 10/24/2018   Lab Results  Component Value Date   CREATININE 0.73 06/16/2020   BUN 9 06/16/2020   NA 141 06/16/2020   K 4.8 06/16/2020   CL 104 06/16/2020   CO2 23 06/16/2020   Lab Results   Component Value Date   HGBA1C 5.2 06/16/2020    Home Medications    Current Outpatient Medications  Medication Sig Dispense Refill   Alirocumab (PRALUENT) 150 MG/ML SOAJ Inject 1 mL (150 mg total) into the skin every 14 (fourteen) days. 6 mL 3   Ascorbic Acid (VITAMIN C) 1000 MG tablet Take 1,000 mg by mouth daily. (Patient not taking: Reported on 10/10/2022)     aspirin EC 325 MG tablet Take 1 tablet (325 mg total) by mouth daily. (Patient not taking: Reported on 10/10/2022) 30 tablet 0   buPROPion (WELLBUTRIN XL) 150 MG 24 hr tablet      Calcium-Magnesium-Zinc (CAL-MAG-ZINC PO) Take 1 tablet by mouth daily.     Carboxymethylcellulose Sodium (THERATEARS OP) Place 1 drop into both eyes daily as needed (irritation).     clonazePAM (KLONOPIN) 0.5 MG tablet Take 0.5-1 tablets by mouth See admin instructions. Take 0.5 mg in the morning and 1 mg in the evening  1   ezetimibe (ZETIA) 10 MG tablet Take 1 tablet (10 mg total) by mouth daily. 90 tablet 3   famotidine (PEPCID) 40 MG tablet 1 tablet 3-7 times per week (Patient not taking: Reported on 10/10/2022) 90 tablet 1   Ferrous Sulfate (IRON PO)  Take 25 mg by mouth daily.     fluticasone (FLONASE) 50 MCG/ACT nasal spray 1-2 sprays each nostril 3-7 times per week 16 g 5   ibuprofen (ADVIL) 800 MG tablet TAKE 1 TABLET EVERY 8 HOURS FOR 10 DAYS. PLEASE TAKE WITH FOOD, PLEASE ALTERNATE WITH ACETAMINOPHEN 30 tablet 0   magnesium gluconate (MAGONATE) 500 MG tablet Take 500 mg by mouth 2 (two) times daily.     METAMUCIL FIBER PO Take 4 capsules by mouth 2 (two) times daily.     methocarbamol (ROBAXIN) 500 MG tablet Take 1 tablet (500 mg total) by mouth 4 (four) times daily. 30 tablet 3   mometasone (ELOCON) 0.1 % ointment Apply twice daily for poison ivy flare up, maximum use 14 days. (Patient not taking: Reported on 10/10/2022) 45 g 1   Multiple Vitamins-Minerals (ADULT GUMMY PO) Take 2 capsules by mouth daily.     Multiple Vitamins-Minerals (HAIR SKIN  NAILS) CAPS Take 1 tablet by mouth daily.     mupirocin ointment (BACTROBAN) 2 % Apply 1 Application topically 2 (two) times daily. (Patient not taking: Reported on 10/10/2022) 22 g 0   niacin (VITAMIN B3) 500 MG tablet Take 500 mg by mouth daily. (Patient not taking: Reported on 10/10/2022)     Omega-3 1000 MG CAPS Take 2,000 mg by mouth daily. (Patient not taking: Reported on 10/10/2022)     omeprazole (PRILOSEC) 40 MG capsule TAKE 1 CAPSULE BY MOUTH IN THE MORNING AND AT BEDTIME. 60 capsule 5   Polyethyl Glycol-Propyl Glycol (SYSTANE OP) Place 1 drop into both eyes daily as needed (irritation).     Probiotic Product (PROBIOTIC PO) Take 1 capsule by mouth daily.     triamcinolone (KENALOG) 0.025 % ointment Apply 1 Application topically 2 (two) times daily. (Patient not taking: Reported on 10/10/2022) 30 g 0   vitamin B-12 (CYANOCOBALAMIN) 500 MCG tablet Take 500 mcg by mouth daily.     zolpidem (AMBIEN) 10 MG tablet Take 10 mg by mouth at bedtime.      No current facility-administered medications for this visit.     Assessment & Plan    Dyslipidemia Assessment: Patient with ASCVD not at LDL goal of < 70 Most recent LDL 106 on 11/199/24 Has been compliant with alirocumab 150 mg q14d Not able to tolerate statins secondary to myalgias Ezetimibe caused dizziness, patient discontinued several months ago Reviewed options for further LDL lowering - bempedoic acid  Plan: Patient will continue with alirocumab A few hours after visit, patient returned to lobby and asked to refill ezetimibe.  Repeat labs after:  3 months Lipid Liver function   Phillips Hay, PharmD CPP [facility] [address]  [place], [place] [address] [phone]  02/14/2023, 7:31 AM

## 2023-02-14 ENCOUNTER — Encounter: Payer: Self-pay | Admitting: Pharmacist Clinician (PhC)/ Clinical Pharmacy Specialist

## 2023-02-14 MED ORDER — EZETIMIBE 10 MG PO TABS: 10.0000 mg | ORAL_TABLET | Freq: Every day | ORAL | 3 refills | Status: AC

## 2023-02-14 NOTE — Assessment & Plan Note (Signed)
Assessment: Patient with ASCVD not at LDL goal of < 70 Most recent LDL 106 on 11/199/24 Has been compliant with alirocumab 150 mg q14d Not able to tolerate statins secondary to myalgias Ezetimibe caused dizziness, patient discontinued several months ago Reviewed options for further LDL lowering - bempedoic acid  Plan: Patient will continue with alirocumab A few hours after visit, patient returned to lobby and asked to refill ezetimibe.  Repeat labs after:  3 months Lipid Liver function

## 2023-03-02 DIAGNOSIS — E785 Hyperlipidemia, unspecified: Secondary | ICD-10-CM | POA: Diagnosis not present

## 2023-03-02 LAB — LIPID PANEL
Chol/HDL Ratio: 3.2 ratio (ref 0.0–4.4)
Cholesterol, Total: 218 mg/dL — ABNORMAL HIGH (ref 100–199)
HDL: 68 mg/dL (ref >39)
LDL Chol Calc (NIH): 134 mg/dL — ABNORMAL HIGH (ref 0–99)
Triglycerides: 90 mg/dL (ref 0–149)
VLDL Cholesterol Cal: 16 mg/dL (ref 5–40)

## 2023-03-11 ENCOUNTER — Other Ambulatory Visit: Payer: Self-pay | Admitting: Cardiology

## 2023-03-13 ENCOUNTER — Other Ambulatory Visit: Payer: Self-pay | Admitting: Cardiology

## 2023-03-14 ENCOUNTER — Other Ambulatory Visit: Payer: Self-pay | Admitting: Cardiology

## 2023-03-14 ENCOUNTER — Other Ambulatory Visit (HOSPITAL_BASED_OUTPATIENT_CLINIC_OR_DEPARTMENT_OTHER): Payer: Self-pay

## 2023-03-14 MED ORDER — PREVNAR 20 0.5 ML IM SUSY
0.5000 mL | PREFILLED_SYRINGE | Freq: Once | INTRAMUSCULAR | 0 refills | Status: DC
  Filled 2023-03-14: qty 0.5, 1d supply, fill #0

## 2023-03-22 ENCOUNTER — Other Ambulatory Visit: Payer: Self-pay

## 2023-03-24 ENCOUNTER — Other Ambulatory Visit (HOSPITAL_BASED_OUTPATIENT_CLINIC_OR_DEPARTMENT_OTHER): Payer: Self-pay | Admitting: Orthopaedic Surgery

## 2023-03-24 ENCOUNTER — Other Ambulatory Visit: Payer: Self-pay | Admitting: Allergy and Immunology

## 2023-03-25 ENCOUNTER — Other Ambulatory Visit (HOSPITAL_BASED_OUTPATIENT_CLINIC_OR_DEPARTMENT_OTHER): Payer: Self-pay

## 2023-03-28 DIAGNOSIS — M5430 Sciatica, unspecified side: Secondary | ICD-10-CM | POA: Diagnosis not present

## 2023-03-28 DIAGNOSIS — M51362 Other intervertebral disc degeneration, lumbar region with discogenic back pain and lower extremity pain: Secondary | ICD-10-CM | POA: Diagnosis not present

## 2023-03-30 DIAGNOSIS — M51362 Other intervertebral disc degeneration, lumbar region with discogenic back pain and lower extremity pain: Secondary | ICD-10-CM | POA: Diagnosis not present

## 2023-03-30 DIAGNOSIS — M5430 Sciatica, unspecified side: Secondary | ICD-10-CM | POA: Diagnosis not present

## 2023-03-31 ENCOUNTER — Ambulatory Visit: Admitting: Podiatry

## 2023-03-31 DIAGNOSIS — M7672 Peroneal tendinitis, left leg: Secondary | ICD-10-CM

## 2023-03-31 NOTE — Progress Notes (Signed)
 Subjective:  Patient ID: Grace Taylor, female    DOB: 01-25-1960,  MRN: [id number]  Chief Complaint  Patient presents with   Nail Problem    Nail fungus    63 y.o. female presents with the above complaint.  Patient presents with left lateral foot pain came out of nowhere all of a sudden she wanted discuss treatment options for hurts with ambulation is with pressure she denies any other acute complaints.  She wanted discuss treatment options for it denies any other acute complaints hurts with ambulation worse with pressure pain scale 7 out of 10   Review of Systems: Negative except as noted in the HPI. Denies N/V/F/Ch.  Past Medical History:  Diagnosis Date   Allergy    Anemia    Benign colon polyp 04/07/2017   Q 5 year recall   Blood transfusion without reported diagnosis    Chronic neck pain - post injury, worker's comp 2013, 2018 04/07/2017   Dr. Dion Saucier   Clostridium difficile infection    Glaucoma    History of peptic ulcer 04/07/2017   Hyperlipidemia    IBS (irritable bowel syndrome) 04/07/2017   Colonoscopy by Leone Payor   Migraines    PTSD (post-traumatic stress disorder) 04/07/2017   Ulcer    age 39    Current Outpatient Medications:    Alirocumab (PRALUENT) 150 MG/ML SOAJ, Inject 1 mL (150 mg total) into the skin every 14 (fourteen) days., Disp: 6 mL, Rfl: 3   Ascorbic Acid (VITAMIN C) 1000 MG tablet, Take 1,000 mg by mouth daily. (Patient not taking: Reported on 10/10/2022), Disp: , Rfl:    aspirin EC 325 MG tablet, Take 1 tablet (325 mg total) by mouth daily. (Patient not taking: Reported on 10/10/2022), Disp: 30 tablet, Rfl: 0   buPROPion (WELLBUTRIN XL) 150 MG 24 hr tablet, , Disp: , Rfl:    Calcium-Magnesium-Zinc (CAL-MAG-ZINC PO), Take 1 tablet by mouth daily., Disp: , Rfl:    Carboxymethylcellulose Sodium (THERATEARS OP), Place 1 drop into both eyes daily as needed (irritation)., Disp: , Rfl:    clonazePAM (KLONOPIN) 0.5 MG tablet, Take 0.5-1 tablets by  mouth See admin instructions. Take 0.5 mg in the morning and 1 mg in the evening, Disp: , Rfl: 1   ezetimibe (ZETIA) 10 MG tablet, Take 1 tablet (10 mg total) by mouth daily., Disp: 90 tablet, Rfl: 3   famotidine (PEPCID) 40 MG tablet, 1 tablet 3-7 times per week (Patient not taking: Reported on 10/10/2022), Disp: 90 tablet, Rfl: 1   Ferrous Sulfate (IRON PO), Take 25 mg by mouth daily., Disp: , Rfl:    fluticasone (FLONASE) 50 MCG/ACT nasal spray, 1-2 SPRAYS EACH NOSTRIL 3-7 TIMES PER WEEK, Disp: 48 mL, Rfl: 2   ibuprofen (ADVIL) 800 MG tablet, TAKE 1 TABLET EVERY 8 HOURS FOR 10 DAYS. PLEASE TAKE WITH FOOD, PLEASE ALTERNATE WITH ACETAMINOPHEN, Disp: 30 tablet, Rfl: 0   magnesium gluconate (MAGONATE) 500 MG tablet, Take 500 mg by mouth 2 (two) times daily., Disp: , Rfl:    METAMUCIL FIBER PO, Take 4 capsules by mouth 2 (two) times daily., Disp: , Rfl:    methocarbamol (ROBAXIN) 500 MG tablet, TAKE 1 TABLET BY MOUTH 4 TIMES DAILY., Disp: 30 tablet, Rfl: 3   mometasone (ELOCON) 0.1 % ointment, Apply twice daily for poison ivy flare up, maximum use 14 days. (Patient not taking: Reported on 10/10/2022), Disp: 45 g, Rfl: 1   Multiple Vitamins-Minerals (ADULT GUMMY PO), Take 2 capsules by mouth daily., Disp: ,  Rfl:    Multiple Vitamins-Minerals (HAIR SKIN NAILS) CAPS, Take 1 tablet by mouth daily., Disp: , Rfl:    mupirocin ointment (BACTROBAN) 2 %, Apply 1 Application topically 2 (two) times daily. (Patient not taking: Reported on 10/10/2022), Disp: 22 g, Rfl: 0   niacin (VITAMIN B3) 500 MG tablet, Take 500 mg by mouth daily. (Patient not taking: Reported on 10/10/2022), Disp: , Rfl:    Omega-3 1000 MG CAPS, Take 2,000 mg by mouth daily. (Patient not taking: Reported on 10/10/2022), Disp: , Rfl:    omeprazole (PRILOSEC) 40 MG capsule, TAKE 1 CAPSULE BY MOUTH IN THE MORNING AND AT BEDTIME., Disp: 60 capsule, Rfl: 5   Polyethyl Glycol-Propyl Glycol (SYSTANE OP), Place 1 drop into both eyes daily as needed  (irritation)., Disp: , Rfl:    Probiotic Product (PROBIOTIC PO), Take 1 capsule by mouth daily., Disp: , Rfl:    triamcinolone (KENALOG) 0.025 % ointment, Apply 1 Application topically 2 (two) times daily. (Patient not taking: Reported on 10/10/2022), Disp: 30 g, Rfl: 0   vitamin B-12 (CYANOCOBALAMIN) 500 MCG tablet, Take 500 mcg by mouth daily., Disp: , Rfl:    zolpidem (AMBIEN) 10 MG tablet, Take 10 mg by mouth at bedtime. , Disp: , Rfl:   Social History   Tobacco Use  Smoking Status Never   Passive exposure: Never  Smokeless Tobacco Never    Allergies  Allergen Reactions   Claritin [Loratadine]     Affects eyes    Codeine     Hyperactivity    Crestor [Rosuvastatin]     INEFFECTIVE AT LOWERING LDL   Lipitor [Atorvastatin]     INEFFECTIVE AT LOWERING LDL   Penicillins Hives   Simvastatin     ELEVATED LIVER ENZYMES   Sulfonamide Derivatives Nausea And Vomiting and Rash   Objective:  There were no vitals filed for this visit. There is no height or weight on file to calculate BMI. Constitutional Well developed. Well nourished.  Vascular Dorsalis pedis pulses palpable bilaterally. Posterior tibial pulses palpable bilaterally. Capillary refill normal to all digits.  No cyanosis or clubbing noted. Pedal hair growth normal.  Neurologic Normal speech. Oriented to person, place, and time. Epicritic sensation to light touch grossly present bilaterally.  Dermatologic Nails well groomed and normal in appearance. No open wounds. No skin lesions.  Orthopedic: Pain on palpation along the course of the peroneal tendon pain with resisted dorsiflexion inversion of the foot no pain with plantarflexion inversion of foot no pain at Achilles tendon posterior tibial tendon   Radiographs: None Assessment:   1. Peroneal tendinitis, left    Plan:  Patient was evaluated and treated and all questions answered.  Left peroneal tendinitis -All questions and concerns were discussed with the  patient extensive detail given the amount of pain that she is experiencing she will benefit from cam boot immobilization Cam boot was dispensed. -If there is no improvement we discussed steroid injection as well  No follow-ups on file.

## 2023-04-03 ENCOUNTER — Emergency Department (HOSPITAL_BASED_OUTPATIENT_CLINIC_OR_DEPARTMENT_OTHER)
Admission: EM | Admit: 2023-04-03 | Discharge: 2023-04-03 | Disposition: A | Discharge status: Home / Self Care | Attending: Emergency Medicine | Admitting: Emergency Medicine

## 2023-04-03 ENCOUNTER — Other Ambulatory Visit: Payer: Self-pay

## 2023-04-03 ENCOUNTER — Emergency Department (HOSPITAL_BASED_OUTPATIENT_CLINIC_OR_DEPARTMENT_OTHER)

## 2023-04-03 ENCOUNTER — Encounter (HOSPITAL_BASED_OUTPATIENT_CLINIC_OR_DEPARTMENT_OTHER): Payer: Self-pay | Admitting: Emergency Medicine

## 2023-04-03 DIAGNOSIS — M25572 Pain in left ankle and joints of left foot: Secondary | ICD-10-CM | POA: Insufficient documentation

## 2023-04-03 DIAGNOSIS — M79605 Pain in left leg: Secondary | ICD-10-CM

## 2023-04-03 DIAGNOSIS — M79672 Pain in left foot: Secondary | ICD-10-CM | POA: Insufficient documentation

## 2023-04-03 DIAGNOSIS — X509XXA Other and unspecified overexertion or strenuous movements or postures, initial encounter: Secondary | ICD-10-CM | POA: Diagnosis not present

## 2023-04-03 DIAGNOSIS — Y9301 Activity, walking, marching and hiking: Secondary | ICD-10-CM | POA: Insufficient documentation

## 2023-04-03 DIAGNOSIS — M79662 Pain in left lower leg: Secondary | ICD-10-CM | POA: Insufficient documentation

## 2023-04-03 MED ORDER — DICLOFENAC SODIUM 1 % EX GEL: 2.0000 g | Freq: Four times a day (QID) | CUTANEOUS | 0 refills | Status: AC

## 2023-04-03 NOTE — ED Provider Notes (Signed)
 Emergency Department Provider Note   I have reviewed the triage vital signs and the nursing notes.   HISTORY  Chief Complaint Foot Pain   HPI Grace Taylor is a 63 y.o. female who presents to the emergency department for evaluation of left ankle and leg pain.  Symptoms have been ongoing for the past week.  Patient states that she was walking to a friend's house and her ankle rolled causing moderate to severe pain in her left lower leg and calf area.  Some of the pain radiates to the left lateral foot.  No numbness or weakness.  She was seen at [facility] and placed in a cam walker boot but states her pain continues.  She has plans to follow-up with orthopedics.  Pain is worse with bearing weight.   Past Medical History:  Diagnosis Date   Allergy    Anemia    Benign colon polyp 04/07/2017   Q 5 year recall   Blood transfusion without reported diagnosis    Chronic neck pain - post injury, worker's comp 2013, 2018 04/07/2017   Dr. Dion Saucier   Clostridium difficile infection    Glaucoma    History of peptic ulcer 04/07/2017   Hyperlipidemia    IBS (irritable bowel syndrome) 04/07/2017   Colonoscopy by Leone Payor   Migraines    PTSD (post-traumatic stress disorder) 04/07/2017   Ulcer    age 49    Review of Systems  Constitutional: No fever/chills Cardiovascular: Denies chest pain. Respiratory: Denies shortness of breath. Gastrointestinal: No abdominal pain. Musculoskeletal: Left foot pain.  Skin: Negative for rash. Neurological: Negative for numbness.  ____________________________________________   PHYSICAL EXAM:  VITAL SIGNS: ED Triage Vitals [04/03/23 1108]  Encounter Vitals Group     BP (!) 169/82     Pulse Rate 100     Resp 16     Temp 98.3 F (36.8 C)     Temp src      SpO2 95 %   Constitutional: Alert and oriented. Well appearing and in no acute distress. Eyes: Conjunctivae are normal.  Head: Atraumatic. Nose: No  congestion/rhinnorhea. Mouth/Throat: Mucous membranes are moist.  Neck: No stridor.   Cardiovascular: Normal rate, regular rhythm. Good peripheral circulation. Grossly normal heart sounds.   Respiratory: Normal respiratory effort.  No retractions. Lungs CTAB. Gastrointestinal: Soft and nontender. No distention.  Musculoskeletal: Mild tenderness palpation over the left lateral ankle without effusion or bruising.  Intact Achilles tendon clinically.  Minimal tenderness over the fifth metatarsal. No skin changes.  Neurologic:  Normal speech and language.  Skin:  Skin is warm, dry and intact. No rash noted.  ____________________________________________  RADIOLOGY  US Venous Img Lower Unilateral Left Result Date: 04/03/2023 CLINICAL DATA:  Left leg pain for a week EXAM: Left LOWER EXTREMITY VENOUS DOPPLER ULTRASOUND TECHNIQUE: Gray-scale sonography with compression, as well as color and duplex ultrasound, were performed to evaluate the deep venous system(s) from the level of the common femoral vein through the popliteal and proximal calf veins. COMPARISON:  None Available. FINDINGS: VENOUS Normal compressibility of the common femoral, superficial femoral, and popliteal veins, as well as the visualized calf veins. Visualized portions of profunda femoral vein and great saphenous vein unremarkable. No filling defects to suggest DVT on grayscale or color Doppler imaging. Doppler waveforms show normal direction of venous flow, normal respiratory plasticity and response to augmentation. Limited views of the contralateral common femoral vein are unremarkable. OTHER None. Limitations: none IMPRESSION: No evidence of left  lower extremity DVT. Electronically Signed   By: Karen Kays M.D.   On: 04/03/2023 12:39    ____________________________________________   PROCEDURES  Procedure(s) performed:   Procedures  None  ____________________________________________   INITIAL IMPRESSION / ASSESSMENT AND PLAN  / ED COURSE  Pertinent labs & imaging results that were available during my care of the patient were reviewed by me and considered in my medical decision making (see chart for details).   This patient is Presenting for Evaluation of XR, which does require a range of treatment options, and is a complaint that involves a moderate risk of morbidity and mortality.  The Differential Diagnoses include fracture, sprain, contusion, DVT, etc.  Radiologic Tests Ordered, included XR. I independently interpreted the images and agree with radiology interpretation.   Medical Decision Making: Summary:  The patient presents emergency department with left foot and ankle pain.  Seem to occur with mild trauma.  Patient arrives in cam walker.  Intact pulses and sensation in the extremity.  No proximal fibular tenderness.  Plan for x-ray and DVT ultrasound.  Reevaluation with update and discussion with patient. XR and DVT US negative. Plan for supportive care at home and outpatient ortho follow up.   Patient's presentation is most consistent with acute, uncomplicated illness.   Disposition: discharge  ____________________________________________  FINAL CLINICAL IMPRESSION(S) / ED DIAGNOSES  Final diagnoses:  Left leg pain     NEW OUTPATIENT MEDICATIONS STARTED DURING THIS VISIT:  Discharge Medication List as of 04/03/2023  1:45 PM     START taking these medications   Details  diclofenac Sodium (VOLTAREN) 1 % GEL Apply 2 g topically 4 (four) times daily., Starting Mon 04/03/2023, Normal        Note:  This document was prepared using Dragon voice recognition software and may include unintentional dictation errors.  Alona Bene, MD, [facility]    [place], Arlyss Repress, MD 04/06/23 1110

## 2023-04-03 NOTE — Discharge Instructions (Signed)
 Your x-rays did not show any broken bones in your ultrasound did not show any blood clots.  I suspect you have sprained your ankle and will need to continue using the boot provided at [facility].  I would advise following up with them or seeing the orthopedics listed, or another of your choice.  You may continue over-the-counter pain medications.

## 2023-04-03 NOTE — ED Triage Notes (Signed)
 Left foot pain x 1 week. Unsure of injury  Seen by ortho on Friday given a boot Reports some pain with the boot Difficulty/pain bearing weight

## 2023-04-04 ENCOUNTER — Telehealth: Payer: Self-pay | Admitting: Cardiology

## 2023-04-04 NOTE — Telephone Encounter (Signed)
 Left VM for patient to discuss her preference for paper billing. I spoke with Education/training team and they stated that as long as the billing address was current, the patient would receive bills to that address. Asked for patient to call back with updated billing address if not correct and advised to look into her Mychart to ensure she has not selected paperless billing as an option.

## 2023-04-05 ENCOUNTER — Other Ambulatory Visit (HOSPITAL_BASED_OUTPATIENT_CLINIC_OR_DEPARTMENT_OTHER): Payer: Self-pay

## 2023-04-06 ENCOUNTER — Other Ambulatory Visit (HOSPITAL_BASED_OUTPATIENT_CLINIC_OR_DEPARTMENT_OTHER): Payer: Self-pay

## 2023-04-06 ENCOUNTER — Encounter (HOSPITAL_BASED_OUTPATIENT_CLINIC_OR_DEPARTMENT_OTHER): Payer: Self-pay | Admitting: Student

## 2023-04-06 ENCOUNTER — Ambulatory Visit (HOSPITAL_BASED_OUTPATIENT_CLINIC_OR_DEPARTMENT_OTHER): Admitting: Student

## 2023-04-06 DIAGNOSIS — M7672 Peroneal tendinitis, left leg: Secondary | ICD-10-CM

## 2023-04-06 DIAGNOSIS — S93402A Sprain of unspecified ligament of left ankle, initial encounter: Secondary | ICD-10-CM | POA: Diagnosis not present

## 2023-04-06 NOTE — Progress Notes (Signed)
 Chief Complaint: Left ankle and foot pain    Discussed the use of AI scribe software for clinical note transcription with the patient, who gave verbal consent to proceed.  History of Present Illness The patient, with a history of sciatica, presents with severe foot pain and swelling following a twisting injury that occurred approximately two weeks ago. The patient describes the pain as worsening each day, with difficulty in weight-bearing and walking. The patient has been using a boot for support but reports minimal relief. The patient also mentions having difficulty lifting her leg due to the pain. The patient has been managing the pain with ibuprofen and has been on antibiotics following recent dental procedures. The patient expresses frustration with the lack of improvement and the impact on her mobility.   Surgical History:   None  PMH/PSH/Family History/Social History/Meds/Allergies:    Past Medical History:  Diagnosis Date   Allergy    Anemia    Benign colon polyp 04/07/2017   Q 5 year recall   Blood transfusion without reported diagnosis    Chronic neck pain - post injury, worker's comp 2013, 2018 04/07/2017   Dr. Dion Saucier   Clostridium difficile infection    Glaucoma    History of peptic ulcer 04/07/2017   Hyperlipidemia    IBS (irritable bowel syndrome) 04/07/2017   Colonoscopy by Leone Payor   Migraines    PTSD (post-traumatic stress disorder) 04/07/2017   Ulcer    age 63   Past Surgical History:  Procedure Laterality Date   CHOLECYSTECTOMY  2012   COLONOSCOPY  2014   DG GALL BLADDER     2012   ESOPHAGOGASTRODUODENOSCOPY  2014   FLEXIBLE SIGMOIDOSCOPY  03/2014   OVARIAN CYST REMOVAL     right shoulder surgery     SHOULDER ARTHROSCOPY WITH ROTATOR CUFF REPAIR AND OPEN BICEPS TENODESIS Left 11/22/2021   Procedure: LEFT SHOULDER ARTHROSCOPY WITH ROTATOR CUFF REPAIR AND BICEPS TENODESIS;  Surgeon: Huel Cote, MD;  Location: [facility] OR;   Service: Orthopedics;  Laterality: Left;   TMJ ARTHROSCOPY     Social History   Socioeconomic History   Marital status: Divorced    Spouse name: Not on file   Number of children: 0   Years of education: some college   Highest education level: Bachelor's degree (e.g., BA, AB, BS)  Occupational History   Occupation: former Naval architect   Occupation: disabled  Tobacco Use   Smoking status: Never    Passive exposure: Never   Smokeless tobacco: Never  Vaping Use   Vaping status: Never Used  Substance and Sexual Activity   Alcohol use: Not Currently   Drug use: No   Sexual activity: Yes    Birth control/protection: Condom, Post-menopausal  Other Topics Concern   Not on file  Social History Narrative   Worked at the [employer], on SSD or SSI - but no health insurance   Social Drivers of Health   Financial Resource Strain: Medium Risk (06/07/2022)   Overall Financial Resource Strain (CARDIA)    Difficulty of Paying Living Expenses: Somewhat hard  Food Insecurity: No Food Insecurity (06/07/2022)   Hunger Vital Sign    Worried About Running Out of Food in the Last Year: Never true    Ran Out of Food in the Last Year: Never true  Transportation  Needs: No Transportation Needs (06/07/2022)   PRAPARE - Administrator, Civil Service (Medical): No    Lack of Transportation (Non-Medical): No  Physical Activity: Sufficiently Active (06/07/2022)   Exercise Vital Sign    Days of Exercise per Week: 3 days    Minutes of Exercise per Session: 140 min  Stress: Stress Concern Present (06/07/2022)   [employer] of Occupational Health - Occupational Stress Questionnaire    Feeling of Stress : To some extent  Social Connections: Moderately Integrated (06/07/2022)   Social Connection and Isolation Panel [NHANES]    Frequency of Communication with Friends and Family: More than three times a week    Frequency of Social Gatherings with Friends and Family: More than three times a week     Attends Religious Services: More than 4 times per year    Active Member of [employer] or Organizations: Yes    Attends Engineer, structural: More than 4 times per year    Marital Status: Divorced   Family History  Problem Relation Age of Onset   Hypertension Mother    Alcohol abuse Mother    Arthritis Mother    Cancer Mother    Heart disease Mother    Kidney disease Mother    Mental illness Mother    Hypertension Father    CAD Father 61       CABG    Hyperlipidemia Father    Heart disease Father    Early death Father    Heart attack Father    Hypertension Sister    Hyperlipidemia Sister    Arthritis Sister    Hypertension Brother    Alcohol abuse Brother    Heart disease Brother    Breast cancer Maternal Aunt    Cancer Maternal Aunt        breast cancer   Colon polyps Neg Hx    Colon cancer Neg Hx    Esophageal cancer Neg Hx    Stomach cancer Neg Hx    Rectal cancer Neg Hx    Allergies  Allergen Reactions   Claritin [Loratadine]     Affects eyes    Codeine     Hyperactivity    Crestor [Rosuvastatin]     INEFFECTIVE AT LOWERING LDL   Lipitor [Atorvastatin]     INEFFECTIVE AT LOWERING LDL   Penicillins Hives   Simvastatin     ELEVATED LIVER ENZYMES   Sulfonamide Derivatives Nausea And Vomiting and Rash   Current Outpatient Medications  Medication Sig Dispense Refill   Alirocumab (PRALUENT) 150 MG/ML SOAJ Inject 1 mL (150 mg total) into the skin every 14 (fourteen) days. 6 mL 3   Ascorbic Acid (VITAMIN C) 1000 MG tablet Take 1,000 mg by mouth daily. (Patient not taking: Reported on 10/10/2022)     aspirin EC 325 MG tablet Take 1 tablet (325 mg total) by mouth daily. (Patient not taking: Reported on 10/10/2022) 30 tablet 0   buPROPion (WELLBUTRIN XL) 150 MG 24 hr tablet      Calcium-Magnesium-Zinc (CAL-MAG-ZINC PO) Take 1 tablet by mouth daily.     Carboxymethylcellulose Sodium (THERATEARS OP) Place 1 drop into both eyes daily as needed (irritation).      clonazePAM (KLONOPIN) 0.5 MG tablet Take 0.5-1 tablets by mouth See admin instructions. Take 0.5 mg in the morning and 1 mg in the evening  1   diclofenac Sodium (VOLTAREN) 1 % GEL Apply 2 g topically 4 (four) times daily. 100 g 0  ezetimibe (ZETIA) 10 MG tablet Take 1 tablet (10 mg total) by mouth daily. 90 tablet 3   famotidine (PEPCID) 40 MG tablet 1 tablet 3-7 times per week (Patient not taking: Reported on 10/10/2022) 90 tablet 1   Ferrous Sulfate (IRON PO) Take 25 mg by mouth daily.     fluticasone (FLONASE) 50 MCG/ACT nasal spray 1-2 SPRAYS EACH NOSTRIL 3-7 TIMES PER WEEK 48 mL 2   ibuprofen (ADVIL) 800 MG tablet TAKE 1 TABLET EVERY 8 HOURS FOR 10 DAYS. PLEASE TAKE WITH FOOD, PLEASE ALTERNATE WITH ACETAMINOPHEN 30 tablet 0   magnesium gluconate (MAGONATE) 500 MG tablet Take 500 mg by mouth 2 (two) times daily.     METAMUCIL FIBER PO Take 4 capsules by mouth 2 (two) times daily.     methocarbamol (ROBAXIN) 500 MG tablet TAKE 1 TABLET BY MOUTH 4 TIMES DAILY. 30 tablet 3   mometasone (ELOCON) 0.1 % ointment Apply twice daily for poison ivy flare up, maximum use 14 days. (Patient not taking: Reported on 10/10/2022) 45 g 1   Multiple Vitamins-Minerals (ADULT GUMMY PO) Take 2 capsules by mouth daily.     Multiple Vitamins-Minerals (HAIR SKIN NAILS) CAPS Take 1 tablet by mouth daily.     mupirocin ointment (BACTROBAN) 2 % Apply 1 Application topically 2 (two) times daily. (Patient not taking: Reported on 10/10/2022) 22 g 0   niacin (VITAMIN B3) 500 MG tablet Take 500 mg by mouth daily. (Patient not taking: Reported on 10/10/2022)     Omega-3 1000 MG CAPS Take 2,000 mg by mouth daily. (Patient not taking: Reported on 10/10/2022)     omeprazole (PRILOSEC) 40 MG capsule TAKE 1 CAPSULE BY MOUTH IN THE MORNING AND AT BEDTIME. 60 capsule 5   Polyethyl Glycol-Propyl Glycol (SYSTANE OP) Place 1 drop into both eyes daily as needed (irritation).     Probiotic Product (PROBIOTIC PO) Take 1 capsule by mouth daily.      triamcinolone (KENALOG) 0.025 % ointment Apply 1 Application topically 2 (two) times daily. (Patient not taking: Reported on 10/10/2022) 30 g 0   vitamin B-12 (CYANOCOBALAMIN) 500 MCG tablet Take 500 mcg by mouth daily.     zolpidem (AMBIEN) 10 MG tablet Take 10 mg by mouth at bedtime.      No current facility-administered medications for this visit.   No results found.  Review of Systems:   A ROS was performed including pertinent positives and negatives as documented in the HPI.  Physical Exam :   Constitutional: NAD and appears stated age Neurological: Alert and oriented Psych: Appropriate affect and cooperative There were no vitals taken for this visit.   Comprehensive Musculoskeletal Exam:    Left ankle exam demonstrates no obvious deformity or significant soft tissue edema.  Tenderness with palpation mainly along the peroneal tendons extending into the foot at their insertions.  Some tenderness with palpation over the lateral ankle ligaments.  Negative anterior drawer and Thompson test.  Dorsalis pedis pulse 2+.  Neurosensory exam intact.  Imaging:   Xray review from 04/03/2023 (left ankle 3 views, left foot 3 views): No evidence of acute bony abnormality   I personally reviewed and interpreted the radiographs.      Assessment & Plan Left ankle pain She likely experienced a low to mid grade lateral ankle sprain with recent inversion injury.  No appreciated laxity within the ankle.  On exam today she does appear to have significant tenderness along the peroneal tendons and was diagnosed with peroneal tendinitis at  recent podiatry visit.  May consider a peroneal tendon tear and given her failure to improve with worsening symptoms, will plan to obtain an MRI of the left ankle for further evaluation of the peroneal tendons and ankle ligaments.  Considered course of oral steroid, however will defer this today due to recent dental procedure.  Plan to have her continue ibuprofen 800  mg as well as RICE therapy and follow-up on MRI once completed.    I personally saw and evaluated the patient, and participated in the management and treatment plan.  Hazle Nordmann, PA-C Orthopedics

## 2023-04-07 ENCOUNTER — Telehealth (HOSPITAL_BASED_OUTPATIENT_CLINIC_OR_DEPARTMENT_OTHER): Payer: Self-pay | Admitting: Student

## 2023-04-07 ENCOUNTER — Other Ambulatory Visit (HOSPITAL_BASED_OUTPATIENT_CLINIC_OR_DEPARTMENT_OTHER): Payer: Self-pay

## 2023-04-07 NOTE — Telephone Encounter (Signed)
 Patient wants meds sent to [facility] [address]

## 2023-04-07 NOTE — Telephone Encounter (Signed)
 Left message following up on medications.  Had discussed at visit to continue current pain medications status post dental procedure, but would like her to let me know if there have been any changes.

## 2023-04-07 NOTE — Telephone Encounter (Signed)
**Note De-identified  [place] Obfuscation** Please advise 

## 2023-04-08 ENCOUNTER — Encounter (HOSPITAL_BASED_OUTPATIENT_CLINIC_OR_DEPARTMENT_OTHER): Payer: Self-pay

## 2023-04-08 ENCOUNTER — Other Ambulatory Visit (HOSPITAL_BASED_OUTPATIENT_CLINIC_OR_DEPARTMENT_OTHER): Payer: Self-pay

## 2023-04-10 ENCOUNTER — Telehealth (HOSPITAL_BASED_OUTPATIENT_CLINIC_OR_DEPARTMENT_OTHER): Payer: Self-pay | Admitting: Orthopaedic Surgery

## 2023-04-10 ENCOUNTER — Other Ambulatory Visit (HOSPITAL_BASED_OUTPATIENT_CLINIC_OR_DEPARTMENT_OTHER): Payer: Self-pay | Admitting: Student

## 2023-04-10 ENCOUNTER — Telehealth (HOSPITAL_BASED_OUTPATIENT_CLINIC_OR_DEPARTMENT_OTHER): Payer: Self-pay | Admitting: Student

## 2023-04-10 MED ORDER — METHOCARBAMOL 500 MG PO TABS: 500.0000 mg | ORAL_TABLET | Freq: Four times a day (QID) | ORAL | 3 refills | Status: AC

## 2023-04-10 NOTE — Telephone Encounter (Signed)
 Patient states that when she was her last week that her foot was not really addressed enough and wants to know can she see some one else.

## 2023-04-10 NOTE — Telephone Encounter (Signed)
 Patient states that her meds have not been called into [facility] pharmacy

## 2023-04-10 NOTE — Telephone Encounter (Signed)
 Sent Robaxin

## 2023-04-11 ENCOUNTER — Other Ambulatory Visit

## 2023-04-16 ENCOUNTER — Other Ambulatory Visit: Payer: Self-pay | Admitting: Cardiology

## 2023-04-16 ENCOUNTER — Other Ambulatory Visit (HOSPITAL_BASED_OUTPATIENT_CLINIC_OR_DEPARTMENT_OTHER): Payer: Self-pay | Admitting: Orthopaedic Surgery

## 2023-04-16 DIAGNOSIS — R931 Abnormal findings on diagnostic imaging of heart and coronary circulation: Secondary | ICD-10-CM

## 2023-04-16 DIAGNOSIS — E785 Hyperlipidemia, unspecified: Secondary | ICD-10-CM

## 2023-04-19 ENCOUNTER — Telehealth: Payer: Self-pay | Admitting: Pharmacist

## 2023-04-19 ENCOUNTER — Other Ambulatory Visit: Payer: Self-pay | Admitting: Cardiology

## 2023-04-19 ENCOUNTER — Telehealth: Payer: Self-pay | Admitting: Pharmacy Technician

## 2023-04-19 ENCOUNTER — Other Ambulatory Visit (HOSPITAL_COMMUNITY): Payer: Self-pay

## 2023-04-19 ENCOUNTER — Encounter: Payer: Self-pay | Admitting: Pharmacy Technician

## 2023-04-19 ENCOUNTER — Other Ambulatory Visit (HOSPITAL_BASED_OUTPATIENT_CLINIC_OR_DEPARTMENT_OTHER): Payer: Self-pay

## 2023-04-19 DIAGNOSIS — R931 Abnormal findings on diagnostic imaging of heart and coronary circulation: Secondary | ICD-10-CM

## 2023-04-19 DIAGNOSIS — F4323 Adjustment disorder with mixed anxiety and depressed mood: Secondary | ICD-10-CM | POA: Diagnosis not present

## 2023-04-19 DIAGNOSIS — E785 Hyperlipidemia, unspecified: Secondary | ICD-10-CM

## 2023-04-19 NOTE — Telephone Encounter (Signed)
 Please complete PA for Praluent 150

## 2023-04-19 NOTE — Telephone Encounter (Signed)
 Pharmacy Patient Advocate Encounter   Received notification from Pt Calls Messages that prior authorization for PRALUENT is required/requested.   Insurance verification completed.   The patient is insured through [employer] .   Per test claim: PA required; PA submitted to above mentioned insurance via CoverMyMeds Key/confirmation #/EOC [facility] Status is pending

## 2023-04-19 NOTE — Telephone Encounter (Signed)
 Sent more information requested on case:[id number]

## 2023-04-19 NOTE — Telephone Encounter (Signed)
 Pharmacy Patient Advocate Encounter  Received notification from [employer] that Prior Authorization for PRALUENT has been APPROVED from 01/04/23 to 01/03/24. Spoke to pharmacy to process.Copay is $184.00 FOR 30 DAYS.    PA #/Case ID/Reference #: [id number]

## 2023-04-23 ENCOUNTER — Other Ambulatory Visit (HOSPITAL_BASED_OUTPATIENT_CLINIC_OR_DEPARTMENT_OTHER): Payer: Self-pay | Admitting: Student

## 2023-04-28 ENCOUNTER — Ambulatory Visit: Admitting: Podiatry

## 2023-04-28 ENCOUNTER — Ambulatory Visit (HOSPITAL_BASED_OUTPATIENT_CLINIC_OR_DEPARTMENT_OTHER): Admitting: Orthopaedic Surgery

## 2023-04-28 ENCOUNTER — Ambulatory Visit (HOSPITAL_BASED_OUTPATIENT_CLINIC_OR_DEPARTMENT_OTHER)

## 2023-04-28 ENCOUNTER — Other Ambulatory Visit: Payer: Self-pay | Admitting: Allergy and Immunology

## 2023-04-28 DIAGNOSIS — M79662 Pain in left lower leg: Secondary | ICD-10-CM | POA: Diagnosis not present

## 2023-04-28 DIAGNOSIS — M25562 Pain in left knee: Secondary | ICD-10-CM

## 2023-04-28 DIAGNOSIS — M1712 Unilateral primary osteoarthritis, left knee: Secondary | ICD-10-CM | POA: Diagnosis not present

## 2023-04-28 DIAGNOSIS — M7662 Achilles tendinitis, left leg: Secondary | ICD-10-CM | POA: Diagnosis not present

## 2023-04-28 DIAGNOSIS — G8929 Other chronic pain: Secondary | ICD-10-CM

## 2023-04-28 DIAGNOSIS — M25462 Effusion, left knee: Secondary | ICD-10-CM | POA: Diagnosis not present

## 2023-04-28 NOTE — Progress Notes (Signed)
 Post Operative Evaluation    Procedure/Date of Surgery: Left shoulder rotator cuff repair with collagen patch augmentation and biceps tenodesis 11/20  Interval History:    Presents today for follow-up predominantly of her left lower extremity.  She has been having persistent pain about the posterior Achilles and left gastroc.  She did suffer fairly significant ankle sprain for which she is subsequently required a cane.  This has been making her entire left side flareup and she is now having pain in her left SI joint and left hip as well.  PMH/PSH/Family History/Social History/Meds/Allergies:    Past Medical History:  Diagnosis Date   Allergy    Anemia    Benign colon polyp 04/07/2017   Q 5 year recall   Blood transfusion without reported diagnosis    Chronic neck pain - post injury, worker's comp 2013, 2018 04/07/2017   Dr. Agatha Horsfall   Clostridium difficile infection    Glaucoma    History of peptic ulcer 04/07/2017   Hyperlipidemia    IBS (irritable bowel syndrome) 04/07/2017   Colonoscopy by Willy Harvest   Migraines    PTSD (post-traumatic stress disorder) 04/07/2017   Ulcer    age 38   Past Surgical History:  Procedure Laterality Date   CHOLECYSTECTOMY  2012   COLONOSCOPY  2014   DG GALL BLADDER     2012   ESOPHAGOGASTRODUODENOSCOPY  2014   FLEXIBLE SIGMOIDOSCOPY  03/2014   OVARIAN CYST REMOVAL     right shoulder surgery     SHOULDER ARTHROSCOPY WITH ROTATOR CUFF REPAIR AND OPEN BICEPS TENODESIS Left 11/22/2021   Procedure: LEFT SHOULDER ARTHROSCOPY WITH ROTATOR CUFF REPAIR AND BICEPS TENODESIS;  Surgeon: Wilhelmenia Harada, MD;  Location: [facility] OR;  Service: Orthopedics;  Laterality: Left;   TMJ ARTHROSCOPY     Social History   Socioeconomic History   Marital status: Divorced    Spouse name: Not on file   Number of children: 0   Years of education: some college   Highest education level: Bachelor's degree (e.g., BA, AB, BS)   Occupational History   Occupation: former Naval architect   Occupation: disabled  Tobacco Use   Smoking status: Never    Passive exposure: Never   Smokeless tobacco: Never  Vaping Use   Vaping status: Never Used  Substance and Sexual Activity   Alcohol  use: Not Currently   Drug use: No   Sexual activity: Yes    Birth control/protection: Condom, Post-menopausal  Other Topics Concern   Not on file  Social History Narrative   Worked at the [employer], on SSD or SSI - but no health insurance   Social Drivers of Health   Financial Resource Strain: Medium Risk (06/07/2022)   Overall Financial Resource Strain (CARDIA)    Difficulty of Paying Living Expenses: Somewhat hard  Food Insecurity: No Food Insecurity (06/07/2022)   Hunger Vital Sign    Worried About Running Out of Food in the Last Year: Never true    Ran Out of Food in the Last Year: Never true  Transportation Needs: No Transportation Needs (06/07/2022)   PRAPARE - Administrator, Civil Service (Medical): No    Lack of Transportation (Non-Medical): No  Physical Activity: Sufficiently Active (06/07/2022)   Exercise Vital Sign    Days of Exercise per Week: 3  days    Minutes of Exercise per Session: 140 min  Stress: Stress Concern Present (06/07/2022)   [employer] of Occupational Health - Occupational Stress Questionnaire    Feeling of Stress : To some extent  Social Connections: Moderately Integrated (06/07/2022)   Social Connection and Isolation Panel [NHANES]    Frequency of Communication with Friends and Family: More than three times a week    Frequency of Social Gatherings with Friends and Family: More than three times a week    Attends Religious Services: More than 4 times per year    Active Member of [employer] or Organizations: Yes    Attends Engineer, structural: More than 4 times per year    Marital Status: Divorced   Family History  Problem Relation Age of Onset   Hypertension Mother    Alcohol   abuse Mother    Arthritis Mother    Cancer Mother    Heart disease Mother    Kidney disease Mother    Mental illness Mother    Hypertension Father    CAD Father 81       CABG    Hyperlipidemia Father    Heart disease Father    Early death Father    Heart attack Father    Hypertension Sister    Hyperlipidemia Sister    Arthritis Sister    Hypertension Brother    Alcohol  abuse Brother    Heart disease Brother    Breast cancer Maternal Aunt    Cancer Maternal Aunt        breast cancer   Colon polyps Neg Hx    Colon cancer Neg Hx    Esophageal cancer Neg Hx    Stomach cancer Neg Hx    Rectal cancer Neg Hx    Allergies  Allergen Reactions   Claritin [Loratadine]     Affects eyes    Codeine     Hyperactivity    Crestor [Rosuvastatin]     INEFFECTIVE AT LOWERING LDL   Lipitor [Atorvastatin ]     INEFFECTIVE AT LOWERING LDL   Penicillins Hives   Simvastatin     ELEVATED LIVER ENZYMES   Sulfonamide Derivatives Nausea And Vomiting and Rash   Current Outpatient Medications  Medication Sig Dispense Refill   Alirocumab  (PRALUENT ) 150 MG/ML SOAJ Inject 1 mL (150 mg total) into the skin every 14 (fourteen) days. 6 mL 3   Ascorbic Acid (VITAMIN C) 1000 MG tablet Take 1,000 mg by mouth daily. (Patient not taking: Reported on 10/10/2022)     aspirin  EC 325 MG tablet Take 1 tablet (325 mg total) by mouth daily. (Patient not taking: Reported on 10/10/2022) 30 tablet 0   buPROPion (WELLBUTRIN XL) 150 MG 24 hr tablet      Calcium -Magnesium-Zinc (CAL-MAG-ZINC PO) Take 1 tablet by mouth daily.     Carboxymethylcellulose Sodium (THERATEARS OP) Place 1 drop into both eyes daily as needed (irritation).     clonazePAM (KLONOPIN) 0.5 MG tablet Take 0.5-1 tablets by mouth See admin instructions. Take 0.5 mg in the morning and 1 mg in the evening  1   diclofenac  Sodium (VOLTAREN ) 1 % GEL Apply 2 g topically 4 (four) times daily. 100 g 0   ezetimibe  (ZETIA ) 10 MG tablet Take 1 tablet (10 mg total)  by mouth daily. 90 tablet 3   famotidine  (PEPCID ) 40 MG tablet TAKE 1 TABLET 3-7 TIMES PER WEEK 90 tablet 1   Ferrous Sulfate (IRON PO) Take 25 mg by mouth  daily.     fluticasone  (FLONASE ) 50 MCG/ACT nasal spray 1-2 SPRAYS EACH NOSTRIL 3-7 TIMES PER WEEK 48 mL 2   ibuprofen  (ADVIL ) 800 MG tablet TAKE 1 TABLET EVERY 8 HOURS FOR 10 DAYS. PLEASE TAKE WITH FOOD, PLEASE ALTERNATE WITH ACETAMINOPHEN  30 tablet 0   magnesium gluconate (MAGONATE) 500 MG tablet Take 500 mg by mouth 2 (two) times daily.     METAMUCIL FIBER PO Take 4 capsules by mouth 2 (two) times daily.     methocarbamol  (ROBAXIN ) 500 MG tablet Take 1 tablet (500 mg total) by mouth 4 (four) times daily. 30 tablet 3   mometasone  (ELOCON ) 0.1 % ointment Apply twice daily for poison ivy flare up, maximum use 14 days. (Patient not taking: Reported on 10/10/2022) 45 g 1   Multiple Vitamins-Minerals (ADULT GUMMY PO) Take 2 capsules by mouth daily.     Multiple Vitamins-Minerals (HAIR SKIN NAILS) CAPS Take 1 tablet by mouth daily.     mupirocin  ointment (BACTROBAN ) 2 % Apply 1 Application topically 2 (two) times daily. (Patient not taking: Reported on 10/10/2022) 22 g 0   niacin (VITAMIN B3) 500 MG tablet Take 500 mg by mouth daily. (Patient not taking: Reported on 10/10/2022)     Omega-3 1000 MG CAPS Take 2,000 mg by mouth daily. (Patient not taking: Reported on 10/10/2022)     omeprazole  (PRILOSEC) 40 MG capsule TAKE 1 CAPSULE BY MOUTH IN THE MORNING AND AT BEDTIME. 60 capsule 5   Polyethyl Glycol-Propyl Glycol (SYSTANE OP) Place 1 drop into both eyes daily as needed (irritation).     PRALUENT  150 MG/ML SOAJ INJECT 1 ML (150 MG TOTAL) INTO THE SKIN EVERY 14 (FOURTEEN) DAYS. 6 mL 1   Probiotic Product (PROBIOTIC PO) Take 1 capsule by mouth daily.     triamcinolone  (KENALOG ) 0.025 % ointment Apply 1 Application topically 2 (two) times daily. (Patient not taking: Reported on 10/10/2022) 30 g 0   vitamin B-12 (CYANOCOBALAMIN ) 500 MCG tablet Take 500 mcg  by mouth daily.     zolpidem (AMBIEN) 10 MG tablet Take 10 mg by mouth at bedtime.      No current facility-administered medications for this visit.   No results found.  Review of Systems:   A ROS was performed including pertinent positives and negatives as documented in the HPI.   Musculoskeletal Exam:    There were no vitals taken for this visit.  Left shoulder incisions are well-healed.  Active forward elevation bilaterally is to 160 degrees with external rotation at the side of 50 degrees.  Internal rotation is to L1 bilaterally.  Strength examination deferred today.  Remainder of neurosensory exam is intact  Tenderness about the left ATFL with positive talar tilt and anterior drawer test.  There is pain about the Achilles as well as gastroc laterally  Imaging:    3 views right shoulder; Mild degenerative findings  I personally reviewed and interpreted the radiographs.   Assessment:   63 year old female with evidence of left severe ankle sprain after an inversion injury.  At this time given the fact she is persistently painful I would like to obtain an MRI to assess the underlying chondral status as well as the ATFL complex.  We also plan to send her for Dr. Vaughn Georges for shockwave therapy of the gastroc and Achilles if she is still having persistent pain in this region.  I will plan to see her following MRI of the left ankle   Plan :    -Return  to clinic following MRI left ankle     I personally saw and evaluated the patient, and participated in the management and treatment plan.  Wilhelmenia Harada, MD Attending Physician, Orthopedic Surgery  This document was dictated using Dragon voice recognition software. A reasonable attempt at proof reading has been made to minimize errors.

## 2023-05-01 ENCOUNTER — Ambulatory Visit
Admission: RE | Admit: 2023-05-01 | Discharge: 2023-05-01 | Disposition: A | Discharge status: Home / Self Care | Source: Ambulatory Visit | Attending: Student | Admitting: Student

## 2023-05-01 DIAGNOSIS — R600 Localized edema: Secondary | ICD-10-CM | POA: Diagnosis not present

## 2023-05-01 DIAGNOSIS — M7672 Peroneal tendinitis, left leg: Secondary | ICD-10-CM

## 2023-05-01 DIAGNOSIS — M25572 Pain in left ankle and joints of left foot: Secondary | ICD-10-CM | POA: Diagnosis not present

## 2023-05-02 ENCOUNTER — Encounter (HOSPITAL_BASED_OUTPATIENT_CLINIC_OR_DEPARTMENT_OTHER): Payer: Self-pay | Admitting: Orthopaedic Surgery

## 2023-05-06 ENCOUNTER — Other Ambulatory Visit

## 2023-05-09 ENCOUNTER — Other Ambulatory Visit (HOSPITAL_BASED_OUTPATIENT_CLINIC_OR_DEPARTMENT_OTHER): Payer: Self-pay | Admitting: Student

## 2023-05-11 ENCOUNTER — Ambulatory Visit: Admitting: Sports Medicine

## 2023-05-11 ENCOUNTER — Encounter: Payer: Self-pay | Admitting: Sports Medicine

## 2023-05-11 DIAGNOSIS — G8929 Other chronic pain: Secondary | ICD-10-CM | POA: Diagnosis not present

## 2023-05-11 DIAGNOSIS — S93402D Sprain of unspecified ligament of left ankle, subsequent encounter: Secondary | ICD-10-CM

## 2023-05-11 DIAGNOSIS — M7662 Achilles tendinitis, left leg: Secondary | ICD-10-CM | POA: Diagnosis not present

## 2023-05-11 DIAGNOSIS — M533 Sacrococcygeal disorders, not elsewhere classified: Secondary | ICD-10-CM | POA: Diagnosis not present

## 2023-05-11 DIAGNOSIS — M7918 Myalgia, other site: Secondary | ICD-10-CM | POA: Diagnosis not present

## 2023-05-11 DIAGNOSIS — M79605 Pain in left leg: Secondary | ICD-10-CM

## 2023-05-11 MED ORDER — METHYLPREDNISOLONE 4 MG PO TBPK: ORAL_TABLET | ORAL | 0 refills | Status: DC

## 2023-05-11 NOTE — Progress Notes (Signed)
 Patient says that she rolled her ankle on a curb over 1 month ago. She says that she is having pain on the top of the foot and lateral ankle. She also says that when she wears shoes, she has pain in the achilles. She is walking with a cane. She says that she has been sitting at home with her foot up, but would like to be outside as she likes to go for walks in the mornings. She has been taking Tylenol  every 8 hours, but says that it does not give her much relief; she says that she needs medicine for inflammation and for pain.

## 2023-05-11 NOTE — Progress Notes (Signed)
 Grace Taylor - 63 y.o. female MRN [id number]  Date of birth: Jan 06, 1960  Office Visit Note: Visit Date: 05/11/2023 PCP: Adelia Homestead, MD Referred by: Wilhelmenia Harada, MD  Subjective: Chief Complaint  Patient presents with   Left Ankle - Pain   HPI: Grace Taylor is a pleasant 63 y.o. female who presents today for evaluation of left leg, ankle, achilles pain.  Over 1 month ago she had a fairly significant ankle inversion injury that she rolled on a curb.  Since then she has had continued ankle pain on the lateral side.  This has developed with tendinitis in the Achilles and pain up in the calf musculature as well.  She is walking with a cane.  Her altered gait has flared up her SI joint pain on the left side which she has a chronic history of from previous injury.  She states the entire leg feels very restricted and feels she would benefit from massage or deep tissue treatment.  She also is interested in aquatic physical therapy as she discussed with Dr. Hermina Loosen in the past.  She is using ibuprofen  and Tylenol  -this helps some but feels like she needs additional anti-inflammatory medication.  Pertinent ROS were reviewed with the patient and found to be negative unless otherwise specified above in HPI.   Assessment & Plan: Visit Diagnoses:  1. Pain in left leg   2. Sprain of left ankle, unspecified ligament, subsequent encounter   3. Achilles tendinitis, left leg   4. Sacroiliac joint dysfunction of left side    Plan: Impression is persistent left ankle pain after an inversion injury over 1 month ago.  Did review previous x-rays which showed no acute fracture.  She continues with quite significant pain over the lateral ankle.  She did have an MRI ordered by Dr. Hermina Loosen and team but this has not yet been reviewed/images returned.  She does have some reciprocal Achilles tendinitis and some myofascial restriction about the calf musculature.  We did proceed with  extracorporeal shockwave treatment for these 2 locations today, she did notice some improvement directly after treatment.  Given her injury and her gait change, she has had a flareup of her chronic left SI joint pain.  For this and her tendinitis, we will start her on a 6-day Medrol  Dosepak.  She is interested in getting the leg and ankle moving but safely, referral sent to aquatic-based physical therapy at [facility].  She also believe that soft tissue or deep tissue massage treatment would be helpful, I did print her out information on our massage therapy at [facility] at [facility] for her to call to schedule an appointment.  I would like to see her back in about 2 weeks to reevaluate the leg and consider additional shockwave treatment for the posterior chain.  Will send a message to her ordering provider for the MRI once this complete so they can review with her.  Follow-up: Return in about 2 weeks (around 05/25/2023) for L-leg  Meds & Orders:  Meds ordered this encounter  Medications   methylPREDNISolone  (MEDROL  DOSEPAK) 4 MG TBPK tablet    Sig: Take per packet instruction. Taper dosing.    Dispense:  1 each    Refill:  0   No orders of the defined types were placed in this encounter.    Procedures: Procedure: ECSWT Indications:  Achilles tendinitis, Calf muscle myofascial pain   Procedure Details Consent: Risks of procedure as well as the alternatives and risks of each  were explained to the patient.  Verbal consent for procedure obtained. Time Out: Verified patient identification, verified procedure, site was marked, verified correct patient position. The area was cleaned with alcohol  swab.     The Left achilles and calf musculature was targeted for Extracorporeal shockwave therapy.    Preset: Tendinitis Power Level: 100 mJ Frequency: 12 Impulse/cycles: 2750 Head size: Regular   Patient tolerated procedure well without immediate complications.         Clinical History: No specialty  comments available.  She reports that she has never smoked. She has never been exposed to tobacco smoke. She has never used smokeless tobacco. No results for input(s): "HGBA1C", "LABURIC" in the last 8760 hours.  Objective:    Physical Exam  Gen: Well-appearing, in no acute distress; non-toxic CV: Well-perfused. Warm.  Resp: Breathing unlabored on room air; no wheezing. Psych: Fluid speech in conversation; appropriate affect; normal thought process  Ortho Exam - LLE: There is tenderness over the Achilles and the calf musculature.  Patient does walk with the assistance of a cane.  - Left foot/ankle: Positive TTP over the lateral malleolus location and in the lateral gutter of the ankle.  There is no appreciable laxity with inversion/eversion stress testing although pain does not limit testing.  No effusion about the ankle.  Imaging:  Independent review and interpretation of three-view left ankle x-ray from 04/03/23 was performed by myself today.  X-rays demonstrate no significant arthritic change.  There is preservation of longitudinal arch.  There is no acute fracture noted.  Possibly very small early Haglund deformity.  Narrative & Impression  CLINICAL DATA:  One-week history of left foot pain. No known injury.   EXAM: LEFT FOOT - COMPLETE 3 VIEW; LEFT ANKLE COMPLETE - 3 VIEW   COMPARISON:  None Available.   FINDINGS: There are no findings of fracture or dislocation. No joint effusion. Ankle mortise is intact. Soft tissues are unremarkable.   IMPRESSION: No acute fracture or dislocation.     Electronically Signed   By: Limin  Xu M.D.   On: 04/03/2023 13:22    Past Medical/Family/Surgical/Social History: Medications & Allergies reviewed per EMR, new medications updated. Patient Active Problem List   Diagnosis Date Noted   Conductive hearing loss in right ear 01/18/2023   Impacted cerumen of right ear 01/18/2023   Insomnia 12/21/2021   Other spondylosis with  radiculopathy, cervical region 04/21/2021   Foraminal stenosis of cervical region 04/21/2021   Elevated coronary artery calcium  score 06/10/2020   Candida laryngitis 10/22/2019   Hoarseness 10/22/2019   Laryngospasms 10/22/2019   Globus sensation 10/22/2019   Lumbar spinal stenosis 07/05/2019   GAD (generalized anxiety disorder) 06/20/2019   Left-sided Bell's palsy 04/04/2019   Closed nondisplaced fracture of distal phalanx of right ring finger 04/03/2019   Lumbosacral disc herniation 12/14/2018   Cervical spinal stenosis 12/14/2018   Nontraumatic incomplete tear of left rotator cuff 06/01/2018   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 Agatha Horsfall   Clostridium difficile infection    Glaucoma    History of peptic ulcer 04/07/2017   Hyperlipidemia    IBS (irritable bowel syndrome) 04/07/2017   Colonoscopy by Willy Harvest   Migraines    PTSD (post-traumatic stress disorder) 04/07/2017   Ulcer    age 39   Family History  Problem Relation Age of Onset   Hypertension Mother    Alcohol  abuse Mother    Arthritis Mother    Cancer Mother    Heart disease Mother    Kidney disease Mother    Mental illness Mother    Hypertension Father    CAD Father 96       CABG    Hyperlipidemia Father    Heart disease Father    Early death Father    Heart attack Father    Hypertension Sister    Hyperlipidemia  Sister    Arthritis Sister    Hypertension Brother    Alcohol  abuse Brother    Heart disease Brother    Breast cancer Maternal Aunt    Cancer Maternal Aunt        breast cancer   Colon polyps Neg Hx    Colon cancer Neg Hx    Esophageal cancer Neg Hx    Stomach cancer Neg Hx    Rectal cancer Neg Hx    Past Surgical History:  Procedure Laterality Date   CHOLECYSTECTOMY  2012   COLONOSCOPY  2014   DG GALL BLADDER     2012   ESOPHAGOGASTRODUODENOSCOPY  2014   FLEXIBLE SIGMOIDOSCOPY  03/2014   OVARIAN CYST REMOVAL     right shoulder surgery     SHOULDER ARTHROSCOPY WITH ROTATOR CUFF REPAIR AND OPEN BICEPS TENODESIS Left 11/22/2021   Procedure: LEFT SHOULDER ARTHROSCOPY WITH ROTATOR CUFF REPAIR AND BICEPS TENODESIS;  Surgeon: Wilhelmenia Harada, MD;  Location: [facility] OR;  Service: Orthopedics;  Laterality: Left;   TMJ ARTHROSCOPY     Social History   Occupational History   Occupation: former Naval architect   Occupation: disabled  Tobacco Use   Smoking status: Never    Passive exposure: Never   Smokeless tobacco: Never  Vaping Use   Vaping status: Never Used  Substance and Sexual Activity   Alcohol  use: Not Currently   Drug use: No   Sexual activity: Yes    Birth control/protection: Condom, Post-menopausal

## 2023-05-16 DIAGNOSIS — M2041 Other hammer toe(s) (acquired), right foot: Secondary | ICD-10-CM | POA: Diagnosis not present

## 2023-05-16 DIAGNOSIS — M7662 Achilles tendinitis, left leg: Secondary | ICD-10-CM | POA: Diagnosis not present

## 2023-05-16 DIAGNOSIS — B351 Tinea unguium: Secondary | ICD-10-CM | POA: Diagnosis not present

## 2023-05-16 DIAGNOSIS — M19072 Primary osteoarthritis, left ankle and foot: Secondary | ICD-10-CM | POA: Diagnosis not present

## 2023-05-16 DIAGNOSIS — M19071 Primary osteoarthritis, right ankle and foot: Secondary | ICD-10-CM | POA: Diagnosis not present

## 2023-05-22 ENCOUNTER — Other Ambulatory Visit (HOSPITAL_BASED_OUTPATIENT_CLINIC_OR_DEPARTMENT_OTHER): Payer: Self-pay | Admitting: Student

## 2023-05-27 DIAGNOSIS — B351 Tinea unguium: Secondary | ICD-10-CM | POA: Diagnosis not present

## 2023-05-31 ENCOUNTER — Ambulatory Visit (HOSPITAL_BASED_OUTPATIENT_CLINIC_OR_DEPARTMENT_OTHER): Admitting: Orthopaedic Surgery

## 2023-05-31 DIAGNOSIS — M5432 Sciatica, left side: Secondary | ICD-10-CM | POA: Diagnosis not present

## 2023-05-31 NOTE — Progress Notes (Signed)
 Post Operative Evaluation    Procedure/Date of Surgery: Left shoulder rotator cuff repair with collagen patch augmentation and biceps tenodesis 11/20  Interval History:    Presents today for follow-up predominantly of her left lower extremity.  At this time she is still having persistent pain radiating down the left lower extremity which does start in the buttock.  She does have a known left-sided L4-L5 disc herniation which was previously seen with Dr. Rochelle Chu.  PMH/PSH/Family History/Social History/Meds/Allergies:    Past Medical History:  Diagnosis Date   Allergy    Anemia    Benign colon polyp 04/07/2017   Q 5 year recall   Blood transfusion without reported diagnosis    Chronic neck pain - post injury, worker's comp 2013, 2018 04/07/2017   Dr. Agatha Horsfall   Clostridium difficile infection    Glaucoma    History of peptic ulcer 04/07/2017   Hyperlipidemia    IBS (irritable bowel syndrome) 04/07/2017   Colonoscopy by Willy Harvest   Migraines    PTSD (post-traumatic stress disorder) 04/07/2017   Ulcer    age 63   Past Surgical History:  Procedure Laterality Date   CHOLECYSTECTOMY  2012   COLONOSCOPY  2014   DG GALL BLADDER     2012   ESOPHAGOGASTRODUODENOSCOPY  2014   FLEXIBLE SIGMOIDOSCOPY  03/2014   OVARIAN CYST REMOVAL     right shoulder surgery     SHOULDER ARTHROSCOPY WITH ROTATOR CUFF REPAIR AND OPEN BICEPS TENODESIS Left 11/22/2021   Procedure: LEFT SHOULDER ARTHROSCOPY WITH ROTATOR CUFF REPAIR AND BICEPS TENODESIS;  Surgeon: Wilhelmenia Harada, MD;  Location: [facility] OR;  Service: Orthopedics;  Laterality: Left;   TMJ ARTHROSCOPY     Social History   Socioeconomic History   Marital status: Divorced    Spouse name: Not on file   Number of children: 0   Years of education: some college   Highest education level: Bachelor's degree (e.g., BA, AB, BS)  Occupational History   Occupation: former Naval architect   Occupation: disabled  Tobacco  Use   Smoking status: Never    Passive exposure: Never   Smokeless tobacco: Never  Vaping Use   Vaping status: Never Used  Substance and Sexual Activity   Alcohol  use: Not Currently   Drug use: No   Sexual activity: Yes    Birth control/protection: Condom, Post-menopausal  Other Topics Concern   Not on file  Social History Narrative   Worked at the [employer], on SSD or SSI - but no health insurance   Social Drivers of Health   Financial Resource Strain: Medium Risk (06/07/2022)   Overall Financial Resource Strain (CARDIA)    Difficulty of Paying Living Expenses: Somewhat hard  Food Insecurity: No Food Insecurity (06/07/2022)   Hunger Vital Sign    Worried About Running Out of Food in the Last Year: Never true    Ran Out of Food in the Last Year: Never true  Transportation Needs: No Transportation Needs (06/07/2022)   PRAPARE - Administrator, Civil Service (Medical): No    Lack of Transportation (Non-Medical): No  Physical Activity: Sufficiently Active (06/07/2022)   Exercise Vital Sign    Days of Exercise per Week: 3 days    Minutes of Exercise per Session: 140 min  Stress: Stress Concern Present (06/07/2022)  [employer] of Occupational Health - Occupational Stress Questionnaire    Feeling of Stress : To some extent  Social Connections: Moderately Integrated (06/07/2022)   Social Connection and Isolation Panel [NHANES]    Frequency of Communication with Friends and Family: More than three times a week    Frequency of Social Gatherings with Friends and Family: More than three times a week    Attends Religious Services: More than 4 times per year    Active Member of [employer] or Organizations: Yes    Attends Engineer, structural: More than 4 times per year    Marital Status: Divorced   Family History  Problem Relation Age of Onset   Hypertension Mother    Alcohol  abuse Mother    Arthritis Mother    Cancer Mother    Heart disease Mother    Kidney  disease Mother    Mental illness Mother    Hypertension Father    CAD Father 56       CABG    Hyperlipidemia Father    Heart disease Father    Early death Father    Heart attack Father    Hypertension Sister    Hyperlipidemia Sister    Arthritis Sister    Hypertension Brother    Alcohol  abuse Brother    Heart disease Brother    Breast cancer Maternal Aunt    Cancer Maternal Aunt        breast cancer   Colon polyps Neg Hx    Colon cancer Neg Hx    Esophageal cancer Neg Hx    Stomach cancer Neg Hx    Rectal cancer Neg Hx    Allergies  Allergen Reactions   Claritin [Loratadine]     Affects eyes    Codeine     Hyperactivity    Crestor [Rosuvastatin]     INEFFECTIVE AT LOWERING LDL   Lipitor [Atorvastatin ]     INEFFECTIVE AT LOWERING LDL   Penicillins Hives   Simvastatin     ELEVATED LIVER ENZYMES   Sulfonamide Derivatives Nausea And Vomiting and Rash   Current Outpatient Medications  Medication Sig Dispense Refill   Alirocumab  (PRALUENT ) 150 MG/ML SOAJ Inject 1 mL (150 mg total) into the skin every 14 (fourteen) days. 6 mL 3   Ascorbic Acid (VITAMIN C) 1000 MG tablet Take 1,000 mg by mouth daily. (Patient not taking: Reported on 10/10/2022)     aspirin  EC 325 MG tablet Take 1 tablet (325 mg total) by mouth daily. (Patient not taking: Reported on 10/10/2022) 30 tablet 0   buPROPion (WELLBUTRIN XL) 150 MG 24 hr tablet      Calcium -Magnesium-Zinc (CAL-MAG-ZINC PO) Take 1 tablet by mouth daily.     Carboxymethylcellulose Sodium (THERATEARS OP) Place 1 drop into both eyes daily as needed (irritation).     clonazePAM (KLONOPIN) 0.5 MG tablet Take 0.5-1 tablets by mouth See admin instructions. Take 0.5 mg in the morning and 1 mg in the evening  1   diclofenac  Sodium (VOLTAREN ) 1 % GEL Apply 2 g topically 4 (four) times daily. 100 g 0   ezetimibe  (ZETIA ) 10 MG tablet Take 1 tablet (10 mg total) by mouth daily. 90 tablet 3   famotidine  (PEPCID ) 40 MG tablet TAKE 1 TABLET 3-7 TIMES  PER WEEK 90 tablet 1   Ferrous Sulfate (IRON PO) Take 25 mg by mouth daily.     fluticasone  (FLONASE ) 50 MCG/ACT nasal spray 1-2 SPRAYS EACH NOSTRIL 3-7 TIMES PER WEEK  48 mL 2   ibuprofen  (ADVIL ) 800 MG tablet TAKE 1 TABLET EVERY 8 HOURS FOR 10 DAYS. PLEASE TAKE WITH FOOD, PLEASE ALTERNATE WITH ACETAMINOPHEN  30 tablet 0   magnesium gluconate (MAGONATE) 500 MG tablet Take 500 mg by mouth 2 (two) times daily.     METAMUCIL FIBER PO Take 4 capsules by mouth 2 (two) times daily.     methocarbamol  (ROBAXIN ) 500 MG tablet Take 1 tablet (500 mg total) by mouth 4 (four) times daily. 30 tablet 3   methylPREDNISolone  (MEDROL  DOSEPAK) 4 MG TBPK tablet Take per packet instruction. Taper dosing. 1 each 0   mometasone  (ELOCON ) 0.1 % ointment Apply twice daily for poison ivy flare up, maximum use 14 days. (Patient not taking: Reported on 10/10/2022) 45 g 1   Multiple Vitamins-Minerals (ADULT GUMMY PO) Take 2 capsules by mouth daily.     Multiple Vitamins-Minerals (HAIR SKIN NAILS) CAPS Take 1 tablet by mouth daily.     mupirocin  ointment (BACTROBAN ) 2 % Apply 1 Application topically 2 (two) times daily. (Patient not taking: Reported on 10/10/2022) 22 g 0   niacin (VITAMIN B3) 500 MG tablet Take 500 mg by mouth daily. (Patient not taking: Reported on 10/10/2022)     Omega-3 1000 MG CAPS Take 2,000 mg by mouth daily. (Patient not taking: Reported on 10/10/2022)     omeprazole  (PRILOSEC) 40 MG capsule TAKE 1 CAPSULE BY MOUTH IN THE MORNING AND AT BEDTIME. 60 capsule 5   Polyethyl Glycol-Propyl Glycol (SYSTANE OP) Place 1 drop into both eyes daily as needed (irritation).     PRALUENT  150 MG/ML SOAJ INJECT 1 ML (150 MG TOTAL) INTO THE SKIN EVERY 14 (FOURTEEN) DAYS. 6 mL 1   Probiotic Product (PROBIOTIC PO) Take 1 capsule by mouth daily.     triamcinolone  (KENALOG ) 0.025 % ointment Apply 1 Application topically 2 (two) times daily. (Patient not taking: Reported on 10/10/2022) 30 g 0   vitamin B-12 (CYANOCOBALAMIN ) 500 MCG  tablet Take 500 mcg by mouth daily.     zolpidem (AMBIEN) 10 MG tablet Take 10 mg by mouth at bedtime.      No current facility-administered medications for this visit.   No results found.  Review of Systems:   A ROS was performed including pertinent positives and negatives as documented in the HPI.   Musculoskeletal Exam:    There were no vitals taken for this visit.  Left shoulder incisions are well-healed.  Active forward elevation bilaterally is to 160 degrees with external rotation at the side of 50 degrees.  Internal rotation is to L1 bilaterally.  Strength examination deferred today.  Remainder of neurosensory exam is intact  Positive straight leg raise on the left side with radiating pain.  There is no muscular weakness  Imaging:    3 views right shoulder; Mild degenerative findings  I personally reviewed and interpreted the radiographs.   Assessment:   63 year old female with evidence of left lumbar radiculopathy in the setting of known L4-L5 disc herniation.  At this time would like to plan for referral to Dr. Sulema Endo as she has had therapy in the past.  She is adamantly opposed to an injection.  I will plan to see her back as needed.  She may engage in physical therapy and aquatic therapy as well   Plan :    -Return to clinic as needed     I personally saw and evaluated the patient, and participated in the management and treatment plan.  Wilhelmenia Harada,  MD Attending Physician, Orthopedic Surgery  This document was dictated using Dragon voice recognition software. A reasonable attempt at proof reading has been made to minimize errors.

## 2023-05-31 NOTE — Addendum Note (Signed)
 Addended by: Albesa Huguenin on: 05/31/2023 05:36 PM   Modules accepted: Orders

## 2023-06-06 ENCOUNTER — Other Ambulatory Visit (HOSPITAL_BASED_OUTPATIENT_CLINIC_OR_DEPARTMENT_OTHER): Payer: Self-pay | Admitting: Orthopaedic Surgery

## 2023-06-12 ENCOUNTER — Encounter (HOSPITAL_BASED_OUTPATIENT_CLINIC_OR_DEPARTMENT_OTHER): Payer: Self-pay | Admitting: Orthopaedic Surgery

## 2023-06-13 DIAGNOSIS — M19072 Primary osteoarthritis, left ankle and foot: Secondary | ICD-10-CM | POA: Diagnosis not present

## 2023-06-13 DIAGNOSIS — B351 Tinea unguium: Secondary | ICD-10-CM | POA: Diagnosis not present

## 2023-06-13 DIAGNOSIS — M7662 Achilles tendinitis, left leg: Secondary | ICD-10-CM | POA: Diagnosis not present

## 2023-06-13 DIAGNOSIS — M19071 Primary osteoarthritis, right ankle and foot: Secondary | ICD-10-CM | POA: Diagnosis not present

## 2023-06-13 DIAGNOSIS — M2041 Other hammer toe(s) (acquired), right foot: Secondary | ICD-10-CM | POA: Diagnosis not present

## 2023-06-20 DIAGNOSIS — F4323 Adjustment disorder with mixed anxiety and depressed mood: Secondary | ICD-10-CM | POA: Diagnosis not present

## 2023-06-21 ENCOUNTER — Other Ambulatory Visit (HOSPITAL_BASED_OUTPATIENT_CLINIC_OR_DEPARTMENT_OTHER): Payer: Self-pay | Admitting: Orthopaedic Surgery

## 2023-07-03 ENCOUNTER — Ambulatory Visit: Admitting: Orthopedic Surgery

## 2023-07-03 ENCOUNTER — Other Ambulatory Visit (INDEPENDENT_AMBULATORY_CARE_PROVIDER_SITE_OTHER): Payer: Self-pay

## 2023-07-03 VITALS — BP 122/77 | HR 80 | Ht 66.0 in | Wt 190.8 lb

## 2023-07-03 DIAGNOSIS — M79605 Pain in left leg: Secondary | ICD-10-CM

## 2023-07-03 NOTE — Progress Notes (Signed)
 Orthopedic Spine Surgery Office Note  Assessment: Patient is a 63 y.o. female with low back pain that radiates into the bilateral buttocks and the left lateral thigh, suspect L5 radiculopathy   Plan: -Explained that initially conservative treatment is tried as a significant number of patients may experience relief with these treatment modalities. Discussed that the conservative treatments include:  -activity modification  -physical therapy  -over the counter pain medications  -medrol  dosepak  -lumbar steroid injections -Patient has tried tylenol , aleve, acupuncture -After discussing her options, she wants to do something more physical like a manipulation.  I told her that chiropractors tend to do manipulations but she was interested in acupuncture since she had done that once and it helped -I told her if that does not work, then she could do gabapentin /Lyrica or a steroid injection -Patient should return to office on an as-needed basis   Patient expressed understanding of the plan and all questions were answered to the patient's satisfaction.   ___________________________________________________________________________   History:  Patient is a 63 y.o. female who presents today for lumbar spine.  Patient has had several years of low back pain radiating into the left leg.  Is gotten progressively worse with time.  She feels a going along the lateral aspect of the thigh and in the buttock.  She has pain radiating to the right buttock as well but is not as severe as the left side.  She feels the pain on a daily basis.  There is no trauma or injury that preceded the onset of the pain.  She has not noticed relief with medications but did notice some temporary relief with acupuncture.   Weakness: Denies Symptoms of imbalance: Denies Paresthesias and numbness: Denies Bowel or bladder incontinence: Denies Saddle anesthesia: Denies  Treatments tried: tylenol , aleve, acupuncture  Review of  systems: Denies fevers and chills, night sweats, unexplained weight loss, history of cancer, pain that wakes her at night  Past medical history: HLD Glaucoma IBS  Allergies: claritin, codeine, penicillin, lipitor, simvastatin, sulfa  Past surgical history:  Bilateral rotator cuff repair Cholecystectomy Finger surgery Rhinoplasty Ovarian cyst removal  Social history: Denies use of nicotine product (smoking, vaping, patches, smokeless) Alcohol  use: Rare Denies recreational drug use   Physical Exam:  BMI of 30.8  General: no acute distress, appears stated age Neurologic: alert, answering questions appropriately, following commands Respiratory: unlabored breathing on room air, symmetric chest rise Psychiatric: appropriate affect, normal cadence to speech   MSK (spine):  -Strength exam      Left  Right EHL    5/5  5/5 TA    5/5  5/5 GSC    5/5  5/5 Knee extension  5/5  5/5 Hip flexion   5/5  5/5  -Sensory exam    Sensation intact to light touch in L3-S1 nerve distributions of bilateral lower extremities  -Straight leg raise: Negative bilaterally -Clonus: no beats bilaterally  Imaging: XRs of the lumbar spine from 07/03/2023 were independently reviewed and interpreted, showing disc height loss at L5/S1.  Grade 1 spondylolisthesis at L4/5.  No other significant degenerative changes.  No fracture or dislocation seen.  MRI of the lumbar spine from 05/22/2021 was independently reviewed and interpreted, showing mild central stenosis at L3/4.  Bilateral lateral recess stenosis at L4/5 (L>R).  Bilateral foraminal stenosis at L4/5.  No other significant stenosis seen.   Patient name: Grace Taylor Patient MRN: [id number] Date of visit: 07/03/23

## 2023-07-12 ENCOUNTER — Telehealth (HOSPITAL_BASED_OUTPATIENT_CLINIC_OR_DEPARTMENT_OTHER): Payer: Self-pay | Admitting: Orthopaedic Surgery

## 2023-07-12 NOTE — Telephone Encounter (Signed)
 Patient wants to do Pt down stairs and deep tissue and water therapy. Please respond upload to her mychart.

## 2023-07-21 ENCOUNTER — Telehealth (HOSPITAL_BASED_OUTPATIENT_CLINIC_OR_DEPARTMENT_OTHER): Payer: Self-pay | Admitting: Orthopaedic Surgery

## 2023-07-21 ENCOUNTER — Other Ambulatory Visit (HOSPITAL_BASED_OUTPATIENT_CLINIC_OR_DEPARTMENT_OTHER): Payer: Self-pay | Admitting: Orthopaedic Surgery

## 2023-07-21 NOTE — Telephone Encounter (Signed)
 Patient would like tylenol   800 called in to [facility] in [place] on [address]

## 2023-08-23 ENCOUNTER — Other Ambulatory Visit (HOSPITAL_BASED_OUTPATIENT_CLINIC_OR_DEPARTMENT_OTHER): Payer: Self-pay | Admitting: Orthopaedic Surgery

## 2023-09-18 ENCOUNTER — Ambulatory Visit: Payer: Self-pay

## 2023-09-18 NOTE — Telephone Encounter (Signed)
 FYI Only or Action Required?: Action required by provider: referral request. Patient wants referral to neurology, referral has been repeatedly refused by ortho  Patient was last seen in primary care on 06/09/2022 by Lendia Nordmann L, NP-C.  Called Nurse Triage reporting Leg Pain.  Symptoms began several years ago.  Interventions attempted: Other: many specialists.  Symptoms are: gradually worsening.  Triage Disposition: See PCP When Office is Open (Within 3 Days)  Patient/caregiver understands and will follow disposition?: Yes  Copied from CRM [id number]. Topic: Clinical - Red Word Triage >> Sep 18, 2023 12:44 PM Suzen RAMAN wrote: Red Word that prompted transfer to Nurse Triage: Worsening Sciatica Pain specifically left leg. Requesting an appt [place] [facility] [place] [facility]. Reason for Disposition  [1] MODERATE pain (e.g., interferes with normal activities, limping) AND [2] present > 3 days  Answer Assessment - Initial Assessment Questions 1. ONSET: When did the pain start?      2018, this flare since 03/2023 2. LOCATION: Where is the pain located?      Left leg pain 3. PAIN: How bad is the pain?    (Scale 1-10; or mild, moderate, severe)     10/10 4. WORK OR EXERCISE: Has there been any recent work or exercise that involved this part of the body?     Decreased mobility, walks with cane 5. CAUSE: What do you think is causing the leg pain?     Loss of disc space at L4-5 due to injury in 2018 6. OTHER SYMPTOMS: Do you have any other symptoms? (e.g., chest pain, back pain, breathing difficulty, swelling, rash, fever, numbness, weakness)     Back pain that radiates to left leg 7. PREGNANCY: Is there any chance you are pregnant? When was your last menstrual period?     N/a  Protocols used: Leg Pain-A-AH

## 2023-09-19 ENCOUNTER — Ambulatory Visit: Admitting: Internal Medicine

## 2023-09-19 ENCOUNTER — Telehealth: Payer: Self-pay | Admitting: Internal Medicine

## 2023-09-19 ENCOUNTER — Encounter: Payer: Self-pay | Admitting: Internal Medicine

## 2023-09-19 VITALS — BP 138/80 | HR 67 | Temp 98.4°F | Ht 66.0 in | Wt 191.0 lb

## 2023-09-19 DIAGNOSIS — Z1211 Encounter for screening for malignant neoplasm of colon: Secondary | ICD-10-CM

## 2023-09-19 DIAGNOSIS — M48061 Spinal stenosis, lumbar region without neurogenic claudication: Secondary | ICD-10-CM

## 2023-09-19 NOTE — Patient Instructions (Addendum)
 We will get you in with a neurologist

## 2023-09-19 NOTE — Telephone Encounter (Signed)
 Dr. Rollene - did you agree to do a pap smear on this patient.  Please advise.

## 2023-09-19 NOTE — Assessment & Plan Note (Signed)
 Chronic low back pain with sciatica worsens with activity. Surgical intervention is not advised, and she declined cortisone injections. Refer to a neurologist for further evaluation.

## 2023-09-19 NOTE — Progress Notes (Signed)
   Subjective:   Patient ID: Grace Taylor, female    DOB: September 10, 1960, 63 y.o.   MRN: [id number]  Discussed the use of AI scribe software for clinical note transcription with the patient, who gave verbal consent to proceed.  History of Present Illness Grace Taylor is a 63 year old female who presents with left leg pain  She experiences persistent pain in her left leg, describing it as locking up and causing significant discomfort. The pain worsens with physical activity, such as lifting heavy objects like cases of water or cat litter. Her hip sometimes 'pops out' and requires realignment, and she occasionally uses a cane for support. Previous management includes Tylenol  and methocarbamol , but she is dissatisfied with these treatments as methocarbamol  affects her blood pressure, which is typically low, especially in the mornings.  She has a history of sciatica and concerns about her lumbar spine, specifically the L4 and L5 discs. She is apprehensive about surgery and its implications on her mobility. She has been offered cortisone shots but is concerned about their long-term effects on bone and muscle health.  Review of Systems  Constitutional: Negative.   HENT: Negative.    Eyes: Negative.   Respiratory:  Negative for cough, chest tightness and shortness of breath.   Cardiovascular:  Negative for chest pain, palpitations and leg swelling.  Gastrointestinal:  Negative for abdominal distention, abdominal pain, constipation, diarrhea, nausea and vomiting.  Musculoskeletal:  Positive for arthralgias, gait problem and myalgias.  Skin: Negative.   Psychiatric/Behavioral: Negative.      Objective:  Physical Exam Constitutional:      Appearance: She is well-developed.  HENT:     Head: Normocephalic and atraumatic.  Pulmonary:     Effort: Pulmonary effort is normal. No respiratory distress.  Abdominal:     General: There is no distension.  Musculoskeletal:         General: Tenderness present.     Cervical back: Normal range of motion.  Skin:    General: Skin is warm and dry.  Neurological:     Mental Status: She is alert and oriented to person, place, and time.     Coordination: Coordination normal.     Vitals:   09/19/23 1558  BP: 138/80  Pulse: 67  Temp: 98.4 F (36.9 C)  TempSrc: Oral  SpO2: 96%  Weight: 191 lb (86.6 kg)  Height: 5' 6 (1.676 m)   Assessment and Plan Assessment & Plan Chronic left leg pain and dysfunction   Chronic left leg pain is likely lumbar radiculopathy and worsens with activity. She has not obtained a referral for integrative therapies. Refer to a neurologist for further evaluation and explore options for deep tissue massage therapy.  Chronic low back pain with bilateral sciatica   Chronic low back pain with sciatica worsens with activity. Surgical intervention is not advised, and she declined cortisone injections. Refer to a neurologist for further evaluation.

## 2023-09-20 DIAGNOSIS — F4323 Adjustment disorder with mixed anxiety and depressed mood: Secondary | ICD-10-CM | POA: Diagnosis not present

## 2023-09-21 DIAGNOSIS — B351 Tinea unguium: Secondary | ICD-10-CM | POA: Diagnosis not present

## 2023-09-21 DIAGNOSIS — M19071 Primary osteoarthritis, right ankle and foot: Secondary | ICD-10-CM | POA: Diagnosis not present

## 2023-09-21 DIAGNOSIS — M7662 Achilles tendinitis, left leg: Secondary | ICD-10-CM | POA: Diagnosis not present

## 2023-09-21 DIAGNOSIS — M19072 Primary osteoarthritis, left ankle and foot: Secondary | ICD-10-CM | POA: Diagnosis not present

## 2023-09-21 DIAGNOSIS — M2041 Other hammer toe(s) (acquired), right foot: Secondary | ICD-10-CM | POA: Diagnosis not present

## 2023-09-25 NOTE — Telephone Encounter (Signed)
 She was asked at last visit to return for CPE. Ok to schedule CPE

## 2023-09-25 NOTE — Telephone Encounter (Signed)
 Pt has been scheduled for her physical

## 2023-10-10 ENCOUNTER — Ambulatory Visit: Admitting: Cardiology

## 2023-10-10 ENCOUNTER — Ambulatory Visit: Payer: Medicare HMO | Admitting: Allergy and Immunology

## 2023-10-10 VITALS — BP 118/76 | HR 87 | Temp 97.6°F | Ht 66.0 in | Wt 187.0 lb

## 2023-10-10 DIAGNOSIS — J3489 Other specified disorders of nose and nasal sinuses: Secondary | ICD-10-CM | POA: Diagnosis not present

## 2023-10-10 DIAGNOSIS — J3089 Other allergic rhinitis: Secondary | ICD-10-CM | POA: Diagnosis not present

## 2023-10-10 DIAGNOSIS — K219 Gastro-esophageal reflux disease without esophagitis: Secondary | ICD-10-CM

## 2023-10-10 DIAGNOSIS — J988 Other specified respiratory disorders: Secondary | ICD-10-CM | POA: Diagnosis not present

## 2023-10-10 MED ORDER — FLUTICASONE PROPIONATE 50 MCG/ACT NA SUSP: NASAL | 2 refills | Status: DC

## 2023-10-10 MED ORDER — AZITHROMYCIN 250 MG PO TABS: ORAL_TABLET | ORAL | 0 refills | Status: DC

## 2023-10-10 MED ORDER — OMEPRAZOLE 40 MG PO CPDR: DELAYED_RELEASE_CAPSULE | ORAL | 5 refills | Status: DC

## 2023-10-10 MED ORDER — METHYLPREDNISOLONE ACETATE 80 MG/ML IJ SUSP
80.0000 mg | Freq: Once | INTRAMUSCULAR | Status: AC
  Administered 2023-10-10: 80 mg via INTRAMUSCULAR

## 2023-10-10 MED ORDER — FAMOTIDINE 40 MG PO TABS: ORAL_TABLET | ORAL | 1 refills | Status: DC

## 2023-10-10 NOTE — Patient Instructions (Addendum)
  1.  Treat and prevent reflux:   A. Omeprazole  40 mg - 1 tablet in AM B. Famotidine  40 mg -1 tablet in PM  2.  Treat and prevent inflammation of airway:  A. Nasal fluticasone  - 2 sprays each nostril 1 time per day B. Depomedrol 80 mg - administered in clinic today  3. Treat infection of airway:   A. Azithromycin  250 - 1 tablet daily x 6 days  4.  Return to clinic in 4 weeks or earlier if problem. Taper medications???  5. Influenza = Tamiflu, Covid = Paxlovid

## 2023-10-10 NOTE — Progress Notes (Unsigned)
 [place] - [place] - [place] - [place] - [place]   Follow-up Note  Referring Provider: Rollene Almarie LABOR, * Primary Provider: Rollene Almarie LABOR, MD Date of Office Visit: 10/10/2023  Subjective:   Grace Taylor (DOB: 1960-04-23) is a 63 y.o. female who [facility] in re-evaluation of the following:  HPI: Kayleana returns to this clinic in evaluation of LPR, allergic rhinitis, septal perforation, dry eye syndrome.  I last saw her in this clinic 04 October 2022.  She thought that she was doing relatively well regarding all of her issues and she is not really sure exactly what medication she has been using to address these issues.  She may be using some omeprazole  and she may be using some famotidine  for her reflux and she may be using some nasal steroid.  But overall she is done relatively well.  But about 3 weeks ago she lost her ability to smell and she has ear pressure and head pressure and Lupita coughing and a raw throat and some yellow sputum production and this does not appear to be improving.  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  Allergies as of 10/10/2023       Reactions   Claritin [loratadine]    Affects eyes    Codeine    Hyperactivity    Crestor [rosuvastatin]    INEFFECTIVE AT LOWERING LDL   Lipitor [atorvastatin ]    INEFFECTIVE AT LOWERING LDL   Penicillins Hives   Simvastatin    ELEVATED LIVER ENZYMES   Sulfonamide Derivatives Nausea And Vomiting, Rash        Medication List    ADULT GUMMY PO Take 2 capsules by mouth daily.   Hair Skin Nails Caps Take 1 tablet by mouth daily.   buPROPion 150 MG 24 hr tablet Commonly known as: WELLBUTRIN XL   CAL-MAG-ZINC PO Take 1 tablet by mouth daily.   clonazePAM 0.5 MG tablet Commonly known as: KLONOPIN Take 0.5-1 tablets by mouth See admin instructions. Take 0.5 mg  in the morning and 1 mg in the evening   diclofenac  Sodium 1 % Gel Commonly known as: Voltaren  Apply 2 g topically 4 (four) times daily.   ezetimibe  10 MG tablet Commonly known as: ZETIA  Take 1 tablet (10 mg total) by mouth daily.   famotidine  40 MG tablet Commonly known as: PEPCID  TAKE 1 TABLET 3-7 TIMES PER WEEK   fluticasone  50 MCG/ACT nasal spray Commonly known as: FLONASE  1-2 SPRAYS EACH NOSTRIL 3-7 TIMES PER WEEK   ibuprofen  800 MG tablet Commonly known as: ADVIL  TAKE 1 TABLET EVERY 8 HOURS FOR 10 DAYS. PLEASE TAKE WITH FOOD, PLEASE ALTERNATE WITH ACETAMINOPHEN    IRON PO Take 25 mg by mouth daily.   magnesium gluconate 500 MG tablet Commonly known as: MAGONATE Take 500 mg by mouth 2 (two) times daily.   METAMUCIL FIBER PO Take 4 capsules by mouth 2 (two) times daily.   methocarbamol  500 MG tablet Commonly known as: ROBAXIN  Take 1 tablet (500 mg total) by mouth 4 (four) times daily.   Omega-3 1000 MG Caps Take 2,000 mg by mouth daily.   omeprazole  40 MG capsule Commonly known as: PRILOSEC TAKE 1 CAPSULE BY MOUTH IN THE MORNING AND AT BEDTIME.   Praluent  150 MG/ML Soaj Generic drug: Alirocumab  Inject 1 mL (150 mg total) into the skin every 14 (fourteen) days.   Praluent  150  MG/ML Soaj Generic drug: Alirocumab  INJECT 1 ML (150 MG TOTAL) INTO THE SKIN EVERY 14 (FOURTEEN) DAYS.   PROBIOTIC PO Take 1 capsule by mouth daily.   SYSTANE OP Place 1 drop into both eyes daily as needed (irritation).   THERATEARS OP Place 1 drop into both eyes daily as needed (irritation).   triamcinolone  0.025 % ointment Commonly known as: KENALOG  Apply 1 Application topically 2 (two) times daily.   vitamin B-12 500 MCG tablet Commonly known as: CYANOCOBALAMIN  Take 500 mcg by mouth daily.   zolpidem 10 MG tablet Commonly known as: AMBIEN Take 10 mg by mouth at bedtime.    Past Medical History:  Diagnosis Date   Allergy    Anemia    Benign colon polyp 04/07/2017    Q 5 year recall   Blood transfusion without reported diagnosis    Chronic neck pain - post injury, worker's comp 2013, 2018 04/07/2017   Dr. Josefina   Clostridium difficile infection    Glaucoma    History of peptic ulcer 04/07/2017   Hyperlipidemia    IBS (irritable bowel syndrome) 04/07/2017   Colonoscopy by [facility]   Migraines    PTSD (post-traumatic stress disorder) 04/07/2017   Ulcer    age 28    Past Surgical History:  Procedure Laterality Date   CHOLECYSTECTOMY  2012   COLONOSCOPY  2014   DG GALL BLADDER     2012   ESOPHAGOGASTRODUODENOSCOPY  2014   FLEXIBLE SIGMOIDOSCOPY  03/2014   OVARIAN CYST REMOVAL     right shoulder surgery     SHOULDER ARTHROSCOPY WITH ROTATOR CUFF REPAIR AND OPEN BICEPS TENODESIS Left 11/22/2021   Procedure: LEFT SHOULDER ARTHROSCOPY WITH ROTATOR CUFF REPAIR AND BICEPS TENODESIS;  Surgeon: Genelle Standing, MD;  Location: [facility] OR;  Service: Orthopedics;  Laterality: Left;   TMJ ARTHROSCOPY      Review of systems negative except as noted in HPI / PMHx or noted below:  Review of Systems  Constitutional: Negative.   HENT: Negative.    Eyes: Negative.   Respiratory: Negative.    Cardiovascular: Negative.   Gastrointestinal: Negative.   Genitourinary: Negative.   Musculoskeletal: Negative.   Skin: Negative.   Neurological: Negative.   Endo/Heme/Allergies: Negative.   Psychiatric/Behavioral: Negative.       Objective:   Vitals:   10/10/23 1058  BP: 118/76  Pulse: 87  Temp: 97.6 F (36.4 C)  SpO2: 96%   Height: 5' 6 (167.6 cm)  Weight: 187 lb (84.8 kg)   Physical Exam Constitutional:      Appearance: She is not diaphoretic.  HENT:     Head: Normocephalic.     Right Ear: Tympanic membrane, ear canal and external ear normal.     Left Ear: Tympanic membrane, ear canal and external ear normal.     Nose: No mucosal edema or rhinorrhea. Nose lacerations: septal perforation.    Mouth/Throat:     Pharynx: Uvula midline. No  oropharyngeal exudate.  Eyes:     Conjunctiva/sclera: Conjunctivae normal.  Neck:     Thyroid : No thyromegaly.     Trachea: Trachea normal. No tracheal tenderness or tracheal deviation.  Cardiovascular:     Rate and Rhythm: Normal rate and regular rhythm.     Heart sounds: Normal heart sounds, S1 normal and S2 normal. No murmur heard. Pulmonary:     Effort: No respiratory distress.     Breath sounds: Normal breath sounds. No stridor. No wheezing or rales.  Lymphadenopathy:  Head:     Right side of head: No tonsillar adenopathy.     Left side of head: No tonsillar adenopathy.     Cervical: No cervical adenopathy.  Skin:    Findings: No erythema or rash.     Nails: There is no clubbing.  Neurological:     Mental Status: She is alert.     Diagnostics: none  Assessment and Plan:   1. Nasal septal perforation   2. LPRD (laryngopharyngeal reflux disease)   3. Perennial allergic rhinitis    1.  Treat and prevent reflux:   A. Omeprazole  40 mg - 1 tablet in AM B. Famotidine  40 mg -1 tablet in PM  2.  Treat and prevent inflammation of airway:  A. Nasal fluticasone  - 2 sprays each nostril 1 time per day B. Depomedrol 80 mg - administered in clinic today  3. Treat infection of airway:   A. Azithromycin  250 - 1 tablet daily x 6 days  4.  Return to clinic in 4 weeks or earlier if problem. Taper medications???  5. Influenza = Tamiflu, Covid = Paxlovid  Traniece appears to have been infected about 3 weeks ago and she is not clearing this issue and she has anosmia and head fullness and drainage and some of the drainage is yellow and I am going to give her a broad-spectrum antibiotic as well as some anti-inflammatory agents for her airway with the use of a systemic steroid and given her previous history of LPR we are going to aggressively treat her reflux with a combination of omeprazole  and famotidine  and I will see her back in this clinic in 4 weeks at which point in time she  will hopefully clear out this issue and then we can taper down some of her medications.  Camellia Denis, MD Allergy / Immunology  [facility]

## 2023-10-11 ENCOUNTER — Encounter: Payer: Self-pay | Admitting: Allergy and Immunology

## 2023-10-11 NOTE — Progress Notes (Unsigned)
  Cardiology Office Note:   Date:  10/12/2023  ID:  Grace Taylor, DOB 02-Mar-1960, MRN [id number] PCP: Rollene Almarie LABOR, MD  [facility] Providers Cardiologist:  Lynwood Schilling, MD {  History of Present Illness:   Grace Taylor is a 64 y.o. female  who presented in 2018 for evaluation of strong family history, dizziness, episodes of shortness of breath and some chest discomfort.   She had a negative POET (Plain Old Exercise Treadmill ) in 2018.  She had a calcium  score of 35 which was 78th percentile. She had a negative perfusion study in 2022.     Since I last saw her she has done well.   The patient denies any new symptoms such as chest discomfort, neck or arm discomfort. There has been no new shortness of breath, PND or orthopnea. There have been no reported palpitations, presyncope or syncope.   She has had sciatica so she has not been quite as active.  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  With this kind of activity she denies any cardiovascular symptoms.  ROS:   Positive for sciatic nerve pain.  Positive for sore throat associated with plaster she has been using. Otherwise as stated in the HPI and negative for all other systems.  Studies Reviewed:    EKG:   EKG Interpretation Date/Time:  Thursday October 12 2023 14:04:16 EDT Ventricular Rate:  84 PR Interval:  142 QRS Duration:  78 QT Interval:  348 QTC Calculation: 411 R Axis:   36  Text Interpretation: Normal sinus rhythm Nonspecific T wave abnormality When compared with ECG of 27-Oct-2022 11:41, No significant change was found Confirmed by Schilling Lynwood ([id number]) on 10/12/2023 2:26:36 PM     Risk Assessment/Calculations:              Physical Exam:   VS:  BP 119/82 (BP Location: Right Arm, Patient Position: Sitting, Cuff Size: Large)   Pulse 90   Ht 5' 6 (1.676 m)   Wt 187 lb (84.8 kg)   SpO2 96%   BMI 30.18 kg/m    Wt Readings from Last 3 Encounters:  10/12/23 187  lb (84.8 kg)  10/10/23 187 lb (84.8 kg)  09/19/23 191 lb (86.6 kg)     GEN: Well nourished, well developed in no acute distress NECK: No JVD; No carotid bruits CARDIAC: RRR, no murmurs, rubs, gallops RESPIRATORY:  Clear to auscultation without rales, wheezing or rhonchi  ABDOMEN: Soft, non-tender, non-distended EXTREMITIES:  No edema; No deformity   ASSESSMENT AND PLAN:   ELEVATED CORONARY CALCIUM :   We are going to participate in primary risk reduction.    DYSLIPIDEMIA:    Previous LDL was 134 earlier this year but she was not consistently getting her Praluent .  I will repeat a lipid profile with goal LDL is in the 70 range.  For now she will remain on the meds as listed.   If she is not at target she likely would need inclisiran.   She would like her lipids to be checked every 6 months.  Follow up with me in 1 year.  Signed, Lynwood Schilling, MD

## 2023-10-12 ENCOUNTER — Encounter: Payer: Self-pay | Admitting: Cardiology

## 2023-10-12 ENCOUNTER — Ambulatory Visit: Attending: Cardiology | Admitting: Cardiology

## 2023-10-12 VITALS — BP 119/82 | HR 90 | Ht 66.0 in | Wt 187.0 lb

## 2023-10-12 DIAGNOSIS — R931 Abnormal findings on diagnostic imaging of heart and coronary circulation: Secondary | ICD-10-CM | POA: Diagnosis not present

## 2023-10-12 DIAGNOSIS — E785 Hyperlipidemia, unspecified: Secondary | ICD-10-CM | POA: Diagnosis not present

## 2023-10-12 NOTE — Patient Instructions (Signed)
 Medication Instructions:  Your physician recommends that you continue on your current medications as directed. Please refer to the Current Medication list given to you today.  *If you need a refill on your cardiac medications before your next appointment, please call your pharmacy*  Lab Work: Fasting lipid panel  If you have labs (blood work) drawn today and your tests are completely normal, you will receive your results only by: MyChart Message (if you have MyChart) OR A paper copy in the mail If you have any lab test that is abnormal or we need to change your treatment, we will call you to review the results.  Testing/Procedures: NONE  Follow-Up: At [facility], you and your health needs are our priority.  As part of our continuing mission to provide you with exceptional heart care, our providers are all part of one team.  This team includes your primary Cardiologist (physician) and Advanced Practice Providers or APPs (Physician Assistants and Nurse Practitioners) who all work together to provide you with the care you need, when you need it.  Your next appointment:   1 year(s)  Provider:   Lynwood Schilling, MD    We recommend signing up for the patient portal called MyChart.  Sign up information is provided on this After Visit Summary.  MyChart is used to connect with patients for Virtual Visits (Telemedicine).  Patients are able to view lab/test results, encounter notes, upcoming appointments, etc.  Non-urgent messages can be sent to your provider as well.   To learn more about what you can do with MyChart, go to [email or web address].

## 2023-10-18 ENCOUNTER — Other Ambulatory Visit: Payer: Self-pay | Admitting: Internal Medicine

## 2023-10-18 DIAGNOSIS — Z1231 Encounter for screening mammogram for malignant neoplasm of breast: Secondary | ICD-10-CM

## 2023-10-23 ENCOUNTER — Encounter: Payer: Self-pay | Admitting: Internal Medicine

## 2023-10-23 ENCOUNTER — Other Ambulatory Visit (HOSPITAL_COMMUNITY): Payer: Self-pay

## 2023-10-23 ENCOUNTER — Telehealth: Payer: Self-pay

## 2023-10-23 ENCOUNTER — Ambulatory Visit (INDEPENDENT_AMBULATORY_CARE_PROVIDER_SITE_OTHER): Admitting: Internal Medicine

## 2023-10-23 ENCOUNTER — Other Ambulatory Visit: Payer: Self-pay | Admitting: Cardiology

## 2023-10-23 VITALS — BP 100/62 | HR 85 | Temp 97.6°F | Ht 66.0 in | Wt 187.0 lb

## 2023-10-23 DIAGNOSIS — E2839 Other primary ovarian failure: Secondary | ICD-10-CM | POA: Diagnosis not present

## 2023-10-23 DIAGNOSIS — E66811 Obesity, class 1: Secondary | ICD-10-CM

## 2023-10-23 DIAGNOSIS — M19079 Primary osteoarthritis, unspecified ankle and foot: Secondary | ICD-10-CM | POA: Insufficient documentation

## 2023-10-23 DIAGNOSIS — Z0000 Encounter for general adult medical examination without abnormal findings: Secondary | ICD-10-CM | POA: Diagnosis not present

## 2023-10-23 DIAGNOSIS — E785 Hyperlipidemia, unspecified: Secondary | ICD-10-CM

## 2023-10-23 DIAGNOSIS — Z6830 Body mass index (BMI) 30.0-30.9, adult: Secondary | ICD-10-CM

## 2023-10-23 DIAGNOSIS — M2041 Other hammer toe(s) (acquired), right foot: Secondary | ICD-10-CM | POA: Insufficient documentation

## 2023-10-23 DIAGNOSIS — M7662 Achilles tendinitis, left leg: Secondary | ICD-10-CM | POA: Insufficient documentation

## 2023-10-23 DIAGNOSIS — Z23 Encounter for immunization: Secondary | ICD-10-CM

## 2023-10-23 MED ORDER — ZEPBOUND 2.5 MG/0.5ML ~~LOC~~ SOAJ: 2.5000 mg | SUBCUTANEOUS | 1 refills | Status: DC

## 2023-10-23 MED ORDER — ZEPBOUND 2.5 MG/0.5ML ~~LOC~~ SOAJ: 2.5000 mg | SUBCUTANEOUS | 1 refills | Status: AC

## 2023-10-23 NOTE — Telephone Encounter (Signed)
 Pharmacy Patient Advocate Encounter   Received notification from CoverMyMeds that prior authorization for Zepbound 2.5MG /0.5ML pen-injectors  is required/requested.   Insurance verification completed.   The patient is insured through [facility] MEDIC[facility].   Per test claim: PA required; PA submitted to above mentioned insurance via Latent Key/confirmation #/EOC [id number] Status is pending

## 2023-10-23 NOTE — Patient Instructions (Signed)
 We have sent in the zepbound to try for the weight.  We have given you the pneumonia shot today.  Let us  know if you need orders for physical therapy.

## 2023-10-23 NOTE — Progress Notes (Unsigned)
   Subjective:   Patient ID: Grace Taylor, female    DOB: October 28, 1960, 63 y.o.   MRN: [id number]  The patient is here for physical. Pertinent topics discussed: Discussed the use of AI scribe software for clinical note transcription with the patient, who gave verbal consent to proceed.  History of Present Illness Grace Taylor is a 63 year old female who presents with inquiries about GLP-1 medications and a pneumonia vaccine.  She is interested in GLP-1 medications for weight loss, specifically low-dose options, and prefers those with minimal side effects. She inquires about the availability of a GLP-1 pill for weight loss. Her current diet includes a boiled egg, half a banana, and a [place] salad, which she has to cut up due to dental issues.  She has undergone extensive dental procedures following an injury involving a forklift, resulting in jaw issues. These procedures include bone grafts and implants, impacting her ability to eat certain foods like apples and nuts. She is currently on a soft diet, including mashed potatoes, mac and cheese, and jello.  She experiences sciatica with leg numbness and reports that a doctor has recommended surgery involving a plate in her back. She is concerned about post-surgical care as she lacks assistance during recovery. She is also interested in physical therapy as a treatment option. She has a past experience with gabapentin  for pain management, prescribed by a neurologist who is no longer practicing. Other doctors have suggested injections, which she declined.  PMH, [facility], social history reviewed and updated  Review of Systems  Constitutional: Negative.   HENT: Negative.    Eyes: Negative.   Respiratory:  Negative for cough, chest tightness and shortness of breath.   Cardiovascular:  Negative for chest pain, palpitations and leg swelling.  Gastrointestinal:  Negative for abdominal distention, abdominal pain, constipation, diarrhea, nausea  and vomiting.  Musculoskeletal:  Positive for arthralgias and back pain.  Skin: Negative.   Neurological: Negative.   Psychiatric/Behavioral: Negative.      Objective:  Physical Exam Constitutional:      Appearance: She is well-developed.  HENT:     Head: Normocephalic and atraumatic.  Cardiovascular:     Rate and Rhythm: Normal rate and regular rhythm.  Pulmonary:     Effort: Pulmonary effort is normal. No respiratory distress.     Breath sounds: Normal breath sounds. No wheezing or rales.  Abdominal:     General: Bowel sounds are normal. There is no distension.     Palpations: Abdomen is soft.     Tenderness: There is no abdominal tenderness.  Musculoskeletal:        General: Tenderness present.     Cervical back: Normal range of motion.  Skin:    General: Skin is warm and dry.  Neurological:     Mental Status: She is alert and oriented to person, place, and time.     Coordination: Coordination normal.     Vitals:   10/23/23 1431  BP: 100/62  Pulse: 85  Temp: 97.6 F (36.4 C)  TempSrc: Temporal  SpO2: 98%  Weight: 187 lb (84.8 kg)  Height: 5' 6 (1.676 m)    Assessment & Plan:  Prevnar 20  given at visit

## 2023-10-24 ENCOUNTER — Ambulatory Visit: Payer: Self-pay | Admitting: Cardiology

## 2023-10-24 DIAGNOSIS — E785 Hyperlipidemia, unspecified: Secondary | ICD-10-CM

## 2023-10-24 LAB — LIPID PANEL
Chol/HDL Ratio: 3 ratio (ref 0.0–4.4)
Cholesterol, Total: 181 mg/dL (ref 100–199)
HDL: 61 mg/dL (ref >39)
LDL Chol Calc (NIH): 92 mg/dL (ref 0–99)
Triglycerides: 166 mg/dL — ABNORMAL HIGH (ref 0–149)
VLDL Cholesterol Cal: 28 mg/dL (ref 5–40)

## 2023-10-25 ENCOUNTER — Other Ambulatory Visit (HOSPITAL_COMMUNITY): Payer: Self-pay

## 2023-10-25 DIAGNOSIS — E669 Obesity, unspecified: Secondary | ICD-10-CM | POA: Insufficient documentation

## 2023-10-25 DIAGNOSIS — Z0000 Encounter for general adult medical examination without abnormal findings: Secondary | ICD-10-CM | POA: Insufficient documentation

## 2023-10-25 NOTE — Assessment & Plan Note (Signed)
 Rx zepbound 2.5 mg weekly to help with weight loss.

## 2023-10-25 NOTE — Assessment & Plan Note (Signed)
 Flu shot declines. Pneumonia given. Shingrix complete. Tetanus up to date. Colonoscopy up to date. Mammogram up to date, pap smear up to date and dexa ordered. Counseled about sun safety and mole surveillance. Counseled about the dangers of distracted driving. Given 10 year screening recommendations.

## 2023-10-25 NOTE — Telephone Encounter (Signed)
 Pharmacy Patient Advocate Encounter  Received notification from [facility] MEDICARE D that Prior Authorization for Zepbound 2.5MG /0.5ML pen-injectors   has been DENIED.  See denial reason below. No denial letter attached in CMM. Will attach denial letter to Media tab once received.   PA #/Case ID/Reference #: [id number]

## 2023-10-25 NOTE — Assessment & Plan Note (Signed)
 Reviewed last lipid panel and continue praluent  and zetia .

## 2023-11-06 ENCOUNTER — Encounter: Payer: Self-pay | Admitting: Radiology

## 2023-11-06 ENCOUNTER — Encounter: Payer: Self-pay | Admitting: Cardiology

## 2023-11-10 DIAGNOSIS — H2513 Age-related nuclear cataract, bilateral: Secondary | ICD-10-CM | POA: Diagnosis not present

## 2023-11-10 DIAGNOSIS — H04123 Dry eye syndrome of bilateral lacrimal glands: Secondary | ICD-10-CM | POA: Diagnosis not present

## 2023-11-10 DIAGNOSIS — H35371 Puckering of macula, right eye: Secondary | ICD-10-CM | POA: Diagnosis not present

## 2023-11-10 DIAGNOSIS — H524 Presbyopia: Secondary | ICD-10-CM | POA: Diagnosis not present

## 2023-11-22 ENCOUNTER — Ambulatory Visit
Admission: RE | Admit: 2023-11-22 | Discharge: 2023-11-22 | Disposition: A | Discharge status: Home / Self Care | Source: Ambulatory Visit | Attending: Internal Medicine | Admitting: Internal Medicine

## 2023-11-22 DIAGNOSIS — Z1231 Encounter for screening mammogram for malignant neoplasm of breast: Secondary | ICD-10-CM | POA: Diagnosis not present

## 2023-11-27 ENCOUNTER — Ambulatory Visit: Payer: Self-pay | Admitting: Internal Medicine

## 2023-11-28 ENCOUNTER — Telehealth: Payer: Self-pay

## 2023-11-28 NOTE — Telephone Encounter (Signed)
 Copied from CRM #[id number]. Topic: Referral - Status >> Nov 28, 2023  2:09 PM Dedra B wrote: Reason for CRM: Pt said the neurologist she was referred to recommended she sees a midwife. She needs the referral sent to Dr. Joshua at [facility].

## 2023-11-29 NOTE — Telephone Encounter (Signed)
 It looks like this is in from 9/16 can you follow up with referrals patient may be able to reach out to Dr. Tamala office directly to schedule.

## 2023-12-04 ENCOUNTER — Other Ambulatory Visit: Payer: Self-pay | Admitting: Cardiology

## 2023-12-04 DIAGNOSIS — E785 Hyperlipidemia, unspecified: Secondary | ICD-10-CM

## 2023-12-04 DIAGNOSIS — R931 Abnormal findings on diagnostic imaging of heart and coronary circulation: Secondary | ICD-10-CM

## 2023-12-20 DIAGNOSIS — M19072 Primary osteoarthritis, left ankle and foot: Secondary | ICD-10-CM | POA: Diagnosis not present

## 2023-12-20 DIAGNOSIS — M2041 Other hammer toe(s) (acquired), right foot: Secondary | ICD-10-CM | POA: Diagnosis not present

## 2023-12-20 DIAGNOSIS — M7662 Achilles tendinitis, left leg: Secondary | ICD-10-CM | POA: Diagnosis not present

## 2023-12-20 DIAGNOSIS — M19071 Primary osteoarthritis, right ankle and foot: Secondary | ICD-10-CM | POA: Diagnosis not present

## 2023-12-25 DIAGNOSIS — F4323 Adjustment disorder with mixed anxiety and depressed mood: Secondary | ICD-10-CM | POA: Diagnosis not present

## 2023-12-29 ENCOUNTER — Telehealth: Payer: Self-pay | Admitting: Allergy

## 2023-12-29 MED ORDER — BENZONATATE 100 MG PO CAPS: 100.0000 mg | ORAL_CAPSULE | Freq: Three times a day (TID) | ORAL | 0 refills | Status: DC | PRN

## 2023-12-29 NOTE — Telephone Encounter (Signed)
 Patient was around a sick church member and since then she developed a dry coughing.  No fevers/chills. No congestion.  Taking cough drops with minimal benefit.  Took mucinex dm with minimal benefit.  Patient had taken tessalon  perles before and would like to try it.  Sent in Tessalon  perles to take. If no improvement over the weekend then go to the UC/ER.  Otherwise we can see her on Monday. No history of asthma.

## 2023-12-30 ENCOUNTER — Other Ambulatory Visit (HOSPITAL_BASED_OUTPATIENT_CLINIC_OR_DEPARTMENT_OTHER): Payer: Self-pay | Admitting: Orthopaedic Surgery

## 2024-01-01 ENCOUNTER — Emergency Department (HOSPITAL_BASED_OUTPATIENT_CLINIC_OR_DEPARTMENT_OTHER): Admitting: Radiology

## 2024-01-01 ENCOUNTER — Other Ambulatory Visit: Payer: Self-pay

## 2024-01-01 ENCOUNTER — Emergency Department (HOSPITAL_BASED_OUTPATIENT_CLINIC_OR_DEPARTMENT_OTHER)
Admission: EM | Admit: 2024-01-01 | Discharge: 2024-01-01 | Disposition: A | Discharge status: Home / Self Care | Attending: Emergency Medicine | Admitting: Emergency Medicine

## 2024-01-01 ENCOUNTER — Encounter (HOSPITAL_BASED_OUTPATIENT_CLINIC_OR_DEPARTMENT_OTHER): Payer: Self-pay | Admitting: Emergency Medicine

## 2024-01-01 DIAGNOSIS — J181 Lobar pneumonia, unspecified organism: Secondary | ICD-10-CM | POA: Diagnosis not present

## 2024-01-01 DIAGNOSIS — I509 Heart failure, unspecified: Secondary | ICD-10-CM | POA: Insufficient documentation

## 2024-01-01 DIAGNOSIS — J189 Pneumonia, unspecified organism: Secondary | ICD-10-CM

## 2024-01-01 DIAGNOSIS — R059 Cough, unspecified: Secondary | ICD-10-CM | POA: Diagnosis present

## 2024-01-01 LAB — BASIC METABOLIC PANEL WITH GFR
Anion gap: 12 (ref 5–15)
BUN: 8 mg/dL (ref 8–23)
CO2: 25 mmol/L (ref 22–32)
Calcium: 10.2 mg/dL (ref 8.9–10.3)
Chloride: 101 mmol/L (ref 98–111)
Creatinine, Ser: 0.76 mg/dL (ref 0.44–1.00)
GFR, Estimated: >60 mL/min
Glucose, Bld: 111 mg/dL — ABNORMAL HIGH (ref 70–99)
Potassium: 3.6 mmol/L (ref 3.5–5.1)
Sodium: 138 mmol/L (ref 135–145)

## 2024-01-01 LAB — CBC WITH DIFFERENTIAL/PLATELET
Abs Immature Granulocytes: 0.01 K/uL (ref 0.00–0.07)
Basophils Absolute: 0 K/uL (ref 0.0–0.1)
Basophils Relative: 1 %
Eosinophils Absolute: 0.1 K/uL (ref 0.0–0.5)
Eosinophils Relative: 2 %
HCT: 37.8 % (ref 36.0–46.0)
Hemoglobin: 13.1 g/dL (ref 12.0–15.0)
Immature Granulocytes: 0 %
Lymphocytes Relative: 51 %
Lymphs Abs: 1.6 K/uL (ref 0.7–4.0)
MCH: 30.9 pg (ref 26.0–34.0)
MCHC: 34.7 g/dL (ref 30.0–36.0)
MCV: 89.2 fL (ref 80.0–100.0)
Monocytes Absolute: 0.3 K/uL (ref 0.1–1.0)
Monocytes Relative: 10 %
Neutro Abs: 1.2 K/uL — ABNORMAL LOW (ref 1.7–7.7)
Neutrophils Relative %: 36 %
Platelets: 165 K/uL (ref 150–400)
RBC: 4.24 MIL/uL (ref 3.87–5.11)
RDW: 13.1 % (ref 11.5–15.5)
WBC: 3.2 K/uL — ABNORMAL LOW (ref 4.0–10.5)
nRBC: 0 % (ref 0.0–0.2)

## 2024-01-01 MED ORDER — CEFUROXIME AXETIL 500 MG PO TABS: 500.0000 mg | ORAL_TABLET | Freq: Two times a day (BID) | ORAL | 0 refills | Status: AC

## 2024-01-01 MED ORDER — AZITHROMYCIN 250 MG PO TABS: 250.0000 mg | ORAL_TABLET | Freq: Every day | ORAL | 0 refills | Status: DC

## 2024-01-01 NOTE — ED Provider Notes (Signed)
 " [place] EMERGENCY DEPARTMENT AT [facility] Provider Note   CSN: [id number] Arrival date & time: 01/01/24  1203     Patient presents with: Cough   Grace Taylor is a 63 y.o. female.  Patient with past history significant for CHF, chronic neck pain, anxiety, anemia, PTSD, IBS presents to the emergency department today with concerns of cough.  Reportedly has been experiencing a cough that has been ongoing for the last 8 days or so without significant improvement.  She states that she was started on Tessalon  by one of her providers and has had no significant change.  Was advised to come to the emergency department if her symptoms were not improving.  She does endorse a fever as recently as yesterday at 70 F.  She states that she is here for a chest x-ray for further evaluation.  Denies any chest pain or shortness of breath at this time.   Cough      Prior to Admission medications  Medication Sig Start Date End Date Taking? Authorizing Provider  azithromycin  (ZITHROMAX ) 250 MG tablet Take 1 tablet (250 mg total) by mouth daily. Take first 2 tablets together, then 1 every day until finished. 01/01/24  Yes Jendayi Berling A, PA-C  cefUROXime (CEFTIN) 500 MG tablet Take 1 tablet (500 mg total) by mouth 2 (two) times daily with a meal for 7 days. 01/01/24 01/08/24 Yes Kyren Vaux A, PA-C  chlorhexidine  (PERIDEX ) 0.12 % solution Use as directed in the mouth or throat at bedtime. Half capful 12/04/23  Yes [provider]  Alirocumab  (PRALUENT ) 150 MG/ML SOAJ INJECT 1 ML (150 MG TOTAL) INTO THE SKIN EVERY 14 (FOURTEEN) DAYS. 12/05/23   Lavona Agent, MD  benzonatate  (TESSALON ) 100 MG capsule Take 1 capsule (100 mg total) by mouth 3 (three) times daily as needed for cough. 12/29/23   Luke Orlan HERO, DO  buPROPion (WELLBUTRIN XL) 150 MG 24 hr tablet  05/29/19   [provider]  Calcium -Magnesium-Zinc (CAL-MAG-ZINC PO) Take 1 tablet by mouth daily.    [provider]  Carboxymethylcellulose Sodium (THERATEARS OP) Place 1 drop into both eyes daily as needed (irritation).    [provider]  clonazePAM (KLONOPIN) 0.5 MG tablet Take 0.5-1 tablets by mouth See admin instructions. Take 0.5 mg in the morning and 1 mg in the evening 09/19/16   [provider]  diclofenac  Sodium (VOLTAREN ) 1 % GEL Apply 2 g topically 4 (four) times daily. Patient not taking: Reported on 10/23/2023 04/03/23   Darra Fonda MATSU, MD  ezetimibe  (ZETIA ) 10 MG tablet Take 1 tablet (10 mg total) by mouth daily. 02/14/23   Lavona Agent, MD  famotidine  (PEPCID ) 40 MG tablet TAKE 1 TABLET 3-7 TIMES PER WEEK 10/10/23   Kozlow, Eric J, MD  Ferrous Sulfate (IRON PO) Take 25 mg by mouth daily.    [provider]  fluticasone  (FLONASE ) 50 MCG/ACT nasal spray 1-2 sprays each nostril 3-7 times per week 10/10/23   Kozlow, Camellia PARAS, MD  ibuprofen  (ADVIL ) 800 MG tablet TAKE 1 TABLET EVERY 8 HOURS FOR 10 DAYS. PLEASE TAKE WITH FOOD, PLEASE ALTERNATE WITH ACETAMINOPHEN  01/01/24   Emiliano Mace L, PA-C  magnesium gluconate (MAGONATE) 500 MG tablet Take 500 mg by mouth 2 (two) times daily.    [provider]  METAMUCIL FIBER PO Take 4 capsules by mouth 2 (two) times daily.    [provider]  methocarbamol  (ROBAXIN ) 500 MG tablet Take 1 tablet (500 mg total)  by mouth 4 (four) times daily. 04/10/23   Emiliano Leonce CROME, PA-C  mometasone  (ELOCON ) 0.1 % ointment Apply twice daily for poison ivy flare up, maximum use 14 days. Patient not taking: Reported on 10/23/2023 07/26/22   Tobie Arleta SQUIBB, MD  Multiple Vitamins-Minerals (ADULT GUMMY PO) Take 2 capsules by mouth daily.    [provider]  Multiple Vitamins-Minerals (HAIR SKIN NAILS) CAPS Take 1 tablet by mouth daily.    [provider]  Omega-3 1000 MG CAPS Take 2,000 mg by mouth daily.    [provider]  omeprazole  (PRILOSEC) 40 MG capsule TAKE 1 CAPSULE BY MOUTH IN THE  MORNING AND AT BEDTIME. 10/10/23   Kozlow, Camellia PARAS, MD  Polyethyl Glycol-Propyl Glycol (SYSTANE OP) Place 1 drop into both eyes daily as needed (irritation).    [provider]  Probiotic Product (PROBIOTIC PO) Take 1 capsule by mouth daily.    [provider]  tirzepatide  (ZEPBOUND ) 2.5 MG/0.5ML Pen Inject 2.5 mg into the skin once a week. 10/23/23   Rollene Almarie LABOR, MD  vitamin B-12 (CYANOCOBALAMIN ) 500 MCG tablet Take 500 mcg by mouth daily.    [provider]  zolpidem (AMBIEN) 10 MG tablet Take 10 mg by mouth at bedtime.  09/03/15   [provider]    Allergies: Claritin [loratadine], Codeine, Crestor [rosuvastatin], Lipitor [atorvastatin ], Penicillins, Simvastatin, and Sulfonamide derivatives    Review of Systems  Respiratory:  Positive for cough.   All other systems reviewed and are negative.   Updated Vital Signs BP 132/87 (BP Location: Right Arm)   Pulse 91   Temp 98.9 F (37.2 C) (Oral)   Resp 18   SpO2 97%   Physical Exam Vitals and nursing note reviewed.  Constitutional:      General: She is not in acute distress.    Appearance: She is well-developed.  HENT:     Head: Normocephalic and atraumatic.  Eyes:     Conjunctiva/sclera: Conjunctivae normal.  Cardiovascular:     Rate and Rhythm: Normal rate and regular rhythm.     Heart sounds: No murmur heard. Pulmonary:     Effort: Pulmonary effort is normal. No respiratory distress.     Breath sounds: Rales present. No wheezing.     Comments: Rales to the left lung base. Abdominal:     Palpations: Abdomen is soft.     Tenderness: There is no abdominal tenderness.  Musculoskeletal:        General: No swelling.     Cervical back: Neck supple.  Skin:    General: Skin is warm and dry.     Capillary Refill: Capillary refill takes less than 2 seconds.  Neurological:     Mental Status: She is alert.  Psychiatric:        Mood and Affect: Mood normal.     (all labs ordered are  listed, but only abnormal results are displayed) Labs Reviewed  BASIC METABOLIC PANEL WITH GFR - Abnormal; Notable for the following components:      Result Value   Glucose, Bld 111 (*)    All other components within normal limits  CBC WITH DIFFERENTIAL/PLATELET - Abnormal; Notable for the following components:   WBC 3.2 (*)    Neutro Abs 1.2 (*)    All other components within normal limits    EKG: None  Radiology: DG Chest 2 View Result Date: 01/01/2024 CLINICAL DATA:  Cough for more than 1 week. EXAM: CHEST - 2 VIEW COMPARISON:  None Available. FINDINGS: Mild patchy opacity at the left lung base. Right lung is clear. Normal heart size and mediastinal contours. No pleural fluid or pneumothorax. No pulmonary edema. On limited assessment, no acute osseous findings IMPRESSION: Mild patchy opacity at the left lung base, may be atelectasis or pneumonia. Electronically Signed   By: Andrea Gasman M.D.   On: 01/01/2024 14:34     Procedures   Medications Ordered in the ED - No data to display                                  Medical Decision Making Risk Prescription drug management.   This patient presents to the ED for concern of cough.  Differential diagnosis includes COVID-19, influenza, bronchitis, pneumonia    Additional history obtained:  Additional history obtained from chart review   Lab Tests:  I Ordered, and personally interpreted labs.  The pertinent results include: CBC with mild leukopenia 3.2, BMP unremarkable   Imaging Studies ordered:  I ordered imaging studies including chest x-ray I independently visualized and interpreted imaging which showed mild patchy opacity at the left lung base, may be atelectasis or pneumonia I agree with the radiologist interpretation   Problem List / ED Course:  Patient with past history significant for CHF, chronic neck pain, anxiety, anemia, PTSD, IBS who presents to the emergency department today with concerns of a  cough.  She reports that she has been having a mildly productive cough for the last 8 days and is not improving.  Started on Tessalon  with slight improvement in cough but reports still feeling a bit rundown and unwell with a fever yesterday with temperature maximum 101 F. On exam, there is some rales heard to the left lower lung base.  Right side unremarkable.  Otherwise well-appearing. Workup today shows unremarkable CBC and BMP.  Chest x-ray is concerning for a mild patchy opacity to the left lung base.  In the setting of prolonged cough, suspect possible pneumonia.  With reassuring vitals, will treat outpatient with combination of cefuroxime and azithromycin .  Patient reports that she currently has follow-up scheduled for tomorrow with PCP.  Advised patient that this may be helpful but likely too soon for any significant change in her symptoms.  Return precautions discussed.  She is otherwise stable for outpatient follow-up and discharged home.   Social Determinants of Health:  None  Final diagnoses:  Community acquired pneumonia of left lower lobe of lung    ED Discharge Orders          Ordered    cefUROXime (CEFTIN) 500 MG tablet  2 times daily with meals        01/01/24 1743    azithromycin  (ZITHROMAX ) 250 MG tablet  Daily        01/01/24 1743               Ruqayya Ventress A, PA-C 01/01/24 1753  "

## 2024-01-01 NOTE — ED Provider Triage Note (Signed)
 Emergency Medicine Provider Triage Evaluation Note  Grace Taylor , a 63 y.o. female  was evaluated in triage.  Pt complains of cough for >1 week. Tried tessalon  perles which didn't work. PCP told her to come to ED if she had a temp with a cough for a chest xray. Has follow up appointment is PCP tomorrow. Endorses fever of 101F at home. Worried for PNA and states that symptoms feel similar today.  Review of Systems  Positive:  Negative:   Physical Exam  BP 138/81 (BP Location: Right Arm)   Pulse 96   Temp 97.8 F (36.6 C)   Resp 18   SpO2 98%  Gen:   Awake, no distress   Resp:  Normal effort  MSK:   Moves extremities without difficulty  Other:    Medical Decision Making  Medically screening exam initiated at 1:38 PM.  Appropriate orders placed.  Grace Taylor was informed that the remainder of the evaluation will be completed by another provider, this initial triage assessment does not replace that evaluation, and the importance of remaining in the ED until their evaluation is complete.    Hoy Nidia FALCON, [place] 01/01/24 1339

## 2024-01-01 NOTE — Discharge Instructions (Signed)
 You were seen in the ER today for concerns of a cough. Your chest xray is concerning for a possible developing left lower lobe pneumonia and I have started you on a course of antibiotics to take for the next 7 days. For any concerns of new or worsening symptoms, return to the ER. Otherwise, follow up closely with your primary care provider.

## 2024-01-01 NOTE — ED Triage Notes (Signed)
 Recent sick contact Cough  Started on tesssalon still coughing Was told to  go to ed for cxr Temp of 101 Just wants an cxr

## 2024-01-02 ENCOUNTER — Ambulatory Visit: Admitting: Allergy and Immunology

## 2024-01-02 ENCOUNTER — Encounter: Payer: Self-pay | Admitting: Allergy and Immunology

## 2024-01-02 VITALS — BP 102/68 | HR 88 | Temp 98.0°F | Resp 14 | Ht 66.0 in | Wt 183.1 lb

## 2024-01-02 DIAGNOSIS — J988 Other specified respiratory disorders: Secondary | ICD-10-CM

## 2024-01-02 DIAGNOSIS — J3089 Other allergic rhinitis: Secondary | ICD-10-CM | POA: Diagnosis not present

## 2024-01-02 DIAGNOSIS — K219 Gastro-esophageal reflux disease without esophagitis: Secondary | ICD-10-CM | POA: Diagnosis not present

## 2024-01-02 DIAGNOSIS — H04123 Dry eye syndrome of bilateral lacrimal glands: Secondary | ICD-10-CM | POA: Diagnosis not present

## 2024-01-02 DIAGNOSIS — J3489 Other specified disorders of nose and nasal sinuses: Secondary | ICD-10-CM

## 2024-01-02 MED ORDER — FLUTICASONE PROPIONATE 50 MCG/ACT NA SUSP: NASAL | 2 refills | Status: DC

## 2024-01-02 MED ORDER — OMEPRAZOLE 40 MG PO CPDR: DELAYED_RELEASE_CAPSULE | ORAL | 5 refills | Status: DC

## 2024-01-02 MED ORDER — FAMOTIDINE 40 MG PO TABS: ORAL_TABLET | ORAL | 1 refills | Status: DC

## 2024-01-02 MED ORDER — PREDNISONE 10 MG PO TABS: 10.0000 mg | ORAL_TABLET | Freq: Every day | ORAL | 0 refills | Status: AC

## 2024-01-02 NOTE — Patient Instructions (Addendum)
" °  1.  Treat and prevent reflux:   A. Omeprazole  40 mg - 1 tablet in AM B. Famotidine  40 mg -1 tablet in PM  2.  Treat and prevent inflammation of airway:  A. Nasal fluticasone  - 2 sprays each nostril 1 time per day  3.  For this recent event:   A. Continue azithromycin  + cefuroxime   B. Prednisone  10 mg - 1 tablet 1 time per day for 5 days  C. Nasal saline  D. Mucinex DM  4.  Influenza = Tamiflu, Covid = Paxlovid  5. Return to clinic in 6 months or earlier if problem "

## 2024-01-02 NOTE — Progress Notes (Unsigned)
 "  [place] - [place] - [place] - [place] - [place]   Follow-up Note  Referring Provider: Rollene Almarie LABOR, MD Primary Provider: Rollene Almarie LABOR, MD Date of Office Visit: 01/02/2024  Subjective:   Grace Taylor (DOB: 1960/03/08) is a 63 y.o. female who [facility] in re-evaluation of the following:  HPI: Grace Taylor returns to this clinic in evaluation of allergic rhinitis, septal perforation, LPR, and dry eye syndrome.  I last saw her in this clinic 10 October 2023.  When I last saw her in this clinic she appeared to have a viral respiratory tract infection with anosmia that was on his third week of presentation and I gave her a systemic steroid and a broad-spectrum antibiotic using azithromycin  to cover mycoplasma.  She resolved the symptoms of that infection.  Approximately 9 days ago she developed a febrile illness with cough and upper airway congestion for which she went to the emergency room yesterday and had a chest x-ray which may have identified a patchy infiltrate left lower lobe and she has been started on empiric therapy for pneumonia with azithromycin  and cefuroxime.  Currently she is not having fevers but she still feels as though she has some chills.  She has complete anosmia even though a lot of her nasal congestion has resolved.  No nasal swabs were obtained in the emergency room.  Allergies as of 01/02/2024       Reactions   Claritin [loratadine]    Affects eyes    Codeine    Hyperactivity    Crestor [rosuvastatin]    INEFFECTIVE AT LOWERING LDL   Lipitor [atorvastatin ]    INEFFECTIVE AT LOWERING LDL   Penicillins Hives   Simvastatin    ELEVATED LIVER ENZYMES   Sulfonamide Derivatives Nausea And Vomiting, Rash        Medication List    ADULT GUMMY PO Take 2 capsules by mouth daily.   Hair Skin Nails Caps Take 1 tablet by mouth daily.   azithromycin  250 MG tablet Commonly known as:  ZITHROMAX  Take 1 tablet (250 mg total) by mouth daily. Take first 2 tablets together, then 1 every day until finished.   benzonatate  100 MG capsule Commonly known as: TESSALON  Take 1 capsule (100 mg total) by mouth 3 (three) times daily as needed for cough.   buPROPion 150 MG 24 hr tablet Commonly known as: WELLBUTRIN XL   CAL-MAG-ZINC PO Take 1 tablet by mouth daily.   cefUROXime 500 MG tablet Commonly known as: CEFTIN Take 1 tablet (500 mg total) by mouth 2 (two) times daily with a meal for 7 days.   chlorhexidine  0.12 % solution Commonly known as: PERIDEX  Use as directed in the mouth or throat at bedtime. Half capful   clonazePAM 0.5 MG tablet Commonly known as: KLONOPIN Take 0.5-1 tablets by mouth See admin instructions. Take 0.5 mg in the morning and 1 mg in the evening   diclofenac  Sodium 1 % Gel Commonly known as: Voltaren  Apply 2 g topically 4 (four) times daily.   ezetimibe  10 MG tablet Commonly known as: ZETIA  Take 1 tablet (10 mg total) by mouth daily.   famotidine  40 MG tablet Commonly known as: PEPCID  TAKE 1 TABLET 3-7 TIMES PER WEEK   fluticasone  50 MCG/ACT nasal spray Commonly known as: FLONASE  1-2 sprays each nostril 3-7 times per week   ibuprofen  800 MG tablet Commonly known as: ADVIL  TAKE 1 TABLET EVERY 8 HOURS FOR 10 DAYS.  PLEASE TAKE WITH FOOD, PLEASE ALTERNATE WITH ACETAMINOPHEN    IRON PO Take 25 mg by mouth daily.   magnesium gluconate 500 MG tablet Commonly known as: MAGONATE Take 500 mg by mouth 2 (two) times daily.   METAMUCIL FIBER PO Take 4 capsules by mouth 2 (two) times daily.   methocarbamol  500 MG tablet Commonly known as: ROBAXIN  Take 1 tablet (500 mg total) by mouth 4 (four) times daily.   mometasone  0.1 % ointment Commonly known as: ELOCON  Apply twice daily for poison ivy flare up, maximum use 14 days.   Omega-3 1000 MG Caps Take 2,000 mg by mouth daily.   omeprazole  40 MG capsule Commonly known as: PRILOSEC TAKE 1  CAPSULE BY MOUTH IN THE MORNING AND AT BEDTIME.   Praluent  150 MG/ML Soaj Generic drug: Alirocumab  INJECT 1 ML (150 MG TOTAL) INTO THE SKIN EVERY 14 (FOURTEEN) DAYS.   PROBIOTIC PO Take 1 capsule by mouth daily.   SYSTANE OP Place 1 drop into both eyes daily as needed (irritation).   THERATEARS OP Place 1 drop into both eyes daily as needed (irritation).   vitamin B-12 500 MCG tablet Commonly known as: CYANOCOBALAMIN  Take 500 mcg by mouth daily.   Zepbound  2.5 MG/0.5ML Pen Generic drug: tirzepatide  Inject 2.5 mg into the skin once a week.   zolpidem 10 MG tablet Commonly known as: AMBIEN Take 10 mg by mouth at bedtime.    Past Medical History:  Diagnosis Date   Allergy    Anemia    Benign colon polyp 04/07/2017   Q 5 year recall   Blood transfusion without reported diagnosis    Chronic neck pain - post injury, worker's comp 2013, 2018 04/07/2017   Dr. Josefina   Clostridium difficile infection    Glaucoma    History of peptic ulcer 04/07/2017   Hyperlipidemia    IBS (irritable bowel syndrome) 04/07/2017   Colonoscopy by [facility]   Migraines    PTSD (post-traumatic stress disorder) 04/07/2017   Ulcer    age 75    Past Surgical History:  Procedure Laterality Date   CHOLECYSTECTOMY  2012   COLONOSCOPY  2014   DG GALL BLADDER     2012   ESOPHAGOGASTRODUODENOSCOPY  2014   FLEXIBLE SIGMOIDOSCOPY  03/2014   OVARIAN CYST REMOVAL     right shoulder surgery     SHOULDER ARTHROSCOPY WITH ROTATOR CUFF REPAIR AND OPEN BICEPS TENODESIS Left 11/22/2021   Procedure: LEFT SHOULDER ARTHROSCOPY WITH ROTATOR CUFF REPAIR AND BICEPS TENODESIS;  Surgeon: Genelle Standing, MD;  Location: [facility] OR;  Service: Orthopedics;  Laterality: Left;   TMJ ARTHROSCOPY      Review of systems negative except as noted in HPI / PMHx or noted below:  Review of Systems  Constitutional: Negative.   HENT: Negative.    Eyes: Negative.   Respiratory: Negative.    Cardiovascular: Negative.    Gastrointestinal: Negative.   Genitourinary: Negative.   Musculoskeletal: Negative.   Skin: Negative.   Neurological: Negative.   Endo/Heme/Allergies: Negative.   Psychiatric/Behavioral: Negative.       Objective:   Vitals:   01/02/24 0856  BP: 102/68  Pulse: 88  Resp: 14  Temp: 98 F (36.7 C)  SpO2: 95%   Height: 5' 6 (167.6 cm)  Weight: 183 lb 1.6 oz (83.1 kg)   Physical Exam Constitutional:      Appearance: She is not diaphoretic.  HENT:     Head: Normocephalic.     Right Ear: Tympanic membrane,  ear canal and external ear normal.     Left Ear: Tympanic membrane, ear canal and external ear normal.     Nose: Mucosal edema (septal perforation) present. No rhinorrhea.     Mouth/Throat:     Pharynx: Uvula midline. No oropharyngeal exudate.  Eyes:     Conjunctiva/sclera: Conjunctivae normal.  Neck:     Thyroid : No thyromegaly.     Trachea: Trachea normal. No tracheal tenderness or tracheal deviation.  Cardiovascular:     Rate and Rhythm: Normal rate and regular rhythm.     Heart sounds: Normal heart sounds, S1 normal and S2 normal. No murmur heard. Pulmonary:     Effort: No respiratory distress.     Breath sounds: Normal breath sounds. No stridor. No wheezing or rales.  Lymphadenopathy:     Head:     Right side of head: No tonsillar adenopathy.     Left side of head: No tonsillar adenopathy.     Cervical: No cervical adenopathy.  Skin:    Findings: No erythema or rash.     Nails: There is no clubbing.  Neurological:     Mental Status: She is alert.     Diagnostics:   Results of blood tests obtained 01 January 2024 identifies WBC 3.2, absolute eosinophil 100, absolute lymphocyte 1600, hemoglobin 13.1, platelet 165.  Results of chest x-ray obtained 01 January 2024 identifies the following:  Mild patchy opacity at the left lung base. Right lung is clear. Normal heart size and mediastinal contours. No pleural fluid or pneumothorax. No pulmonary edema.  On limited assessment, no acute osseous findings  Assessment and Plan:   1. Respiratory tract infection   2. LPRD (laryngopharyngeal reflux disease)   3. Perennial allergic rhinitis   4. Nasal septal perforation   5. Dry eye syndrome of both eyes    1.  Treat and prevent reflux:   A. Omeprazole  40 mg - 1 tablet in AM B. Famotidine  40 mg -1 tablet in PM  2.  Treat and prevent inflammation of airway:  A. Nasal fluticasone  - 2 sprays each nostril 1 time per day  3.  For this recent event:   A. Continue azithromycin  + cefuroxime   B. Prednisone  10 mg - 1 tablet 1 time per day for 5 days  C. Nasal saline  D. Mucinex DM  4.  Influenza = Tamiflu, Covid = Paxlovid  5. Return to clinic in 6 months or earlier if problem  Grace Taylor obviously has a infection of her airway and given her total anosmia and her constitutional symptoms and her fever she probably has COVID but possibly influenza.  She is out of the window for treatment of either infectious disease.  She can continue to use her azithromycin  and cefuroxime as directed by the ED for empiric therapy for community-acquired pneumonia.  I have given her a low-dose of steroids to help with the inflammatory component of this event.  Assuming she does well we will see her back in this clinic in 6 months or earlier if there is a problem.  Grace Denis, MD Allergy / Immunology [facility] "

## 2024-01-03 ENCOUNTER — Encounter: Payer: Self-pay | Admitting: Allergy and Immunology

## 2024-01-09 ENCOUNTER — Encounter: Payer: Self-pay | Admitting: Allergy and Immunology

## 2024-01-09 ENCOUNTER — Ambulatory Visit: Admitting: Allergy and Immunology

## 2024-01-09 ENCOUNTER — Other Ambulatory Visit: Payer: Self-pay

## 2024-01-09 ENCOUNTER — Telehealth: Payer: Self-pay | Admitting: Allergy and Immunology

## 2024-01-09 ENCOUNTER — Telehealth: Payer: Self-pay

## 2024-01-09 VITALS — BP 116/88 | HR 93 | Temp 97.3°F | Ht 66.0 in | Wt 183.0 lb

## 2024-01-09 DIAGNOSIS — J988 Other specified respiratory disorders: Secondary | ICD-10-CM | POA: Diagnosis not present

## 2024-01-09 DIAGNOSIS — K219 Gastro-esophageal reflux disease without esophagitis: Secondary | ICD-10-CM

## 2024-01-09 DIAGNOSIS — J3489 Other specified disorders of nose and nasal sinuses: Secondary | ICD-10-CM

## 2024-01-09 DIAGNOSIS — H04123 Dry eye syndrome of bilateral lacrimal glands: Secondary | ICD-10-CM

## 2024-01-09 DIAGNOSIS — J3089 Other allergic rhinitis: Secondary | ICD-10-CM

## 2024-01-09 MED ORDER — HYDROCOD POLI-CHLORPHE POLI ER 10-8 MG/5ML PO SUER: 2.5000 mL | Freq: Two times a day (BID) | ORAL | 0 refills | Status: DC | PRN

## 2024-01-09 MED ORDER — OMEPRAZOLE 40 MG PO CPDR: DELAYED_RELEASE_CAPSULE | ORAL | 5 refills | Status: AC

## 2024-01-09 MED ORDER — METHYLPREDNISOLONE ACETATE 80 MG/ML IJ SUSP
80.0000 mg | Freq: Once | INTRAMUSCULAR | Status: AC
  Administered 2024-01-09: 80 mg via INTRAMUSCULAR

## 2024-01-09 MED ORDER — FLUTICASONE PROPIONATE 50 MCG/ACT NA SUSP: NASAL | 2 refills | Status: AC

## 2024-01-09 MED ORDER — FAMOTIDINE 40 MG PO TABS: ORAL_TABLET | ORAL | 1 refills | Status: AC

## 2024-01-09 NOTE — Progress Notes (Unsigned)
 "  [place] [facility] [place] [facility] [place] [facility] [place] [facility] [place]   Follow-up Note  Referring Provider: Rollene Almarie LABOR, MD Primary Provider: Rollene Almarie LABOR, MD Date of Office Visit: 01/09/2024  Subjective:   Grace Taylor (DOB: 05-Apr-1960) is a 64 y.o. female who [facility] in re-evaluation of the following:  HPI: Shalaya returns to this clinic in evaluation of allergic rhinitis, septal perforation, LPR, dry eye syndrome, and a recent infectious disease that she contracted middle of December 2025.  In review, she contracted an infection associated with a chest x-ray which might of suggested a community-acquired pneumonia and she was treated with a combination of azithromycin  and and cefuroxime  in the ER setting and although she improved she has had continued cough and continued anosmia although she has had a little bit better ability to smell over the course of the past 2 days.  It is the coughing that is driving her crazy and when she coughs she does make some occasional bloody postnasal drip and she has had some blood come out of her nostril as well.  Allergies as of 01/09/2024       Reactions   Claritin [loratadine]    Affects eyes    Codeine    Hyperactivity    Crestor [rosuvastatin]    INEFFECTIVE AT LOWERING LDL   Lipitor [atorvastatin ]    INEFFECTIVE AT LOWERING LDL   Penicillins Hives   Simvastatin    ELEVATED LIVER ENZYMES   Sulfonamide Derivatives Nausea And Vomiting, Rash        Medication List    ADULT GUMMY PO Take 2 capsules by mouth daily.   Hair Skin Nails Caps Take 1 tablet by mouth daily.   benzonatate  100 MG capsule Commonly known as: TESSALON  Take 1 capsule (100 mg total) by mouth 3 (three) times daily as needed for cough.   buPROPion 150 MG 24 hr tablet Commonly known as: WELLBUTRIN XL   CAL-MAG-ZINC PO Take 1 tablet by mouth daily.   chlorhexidine  0.12 % solution Commonly known  as: PERIDEX  Use as directed in the mouth or throat at bedtime. Half capful   clonazePAM 0.5 MG tablet Commonly known as: KLONOPIN Take 0.5-1 tablets by mouth See admin instructions. Take 0.5 mg in the morning and 1 mg in the evening   diclofenac  Sodium 1 % Gel Commonly known as: Voltaren  Apply 2 g topically 4 (four) times daily.   ezetimibe  10 MG tablet Commonly known as: ZETIA  Take 1 tablet (10 mg total) by mouth daily.   famotidine  40 MG tablet Commonly known as: PEPCID  TAKE 1 TABLET IN THE EVENING   fluticasone  50 MCG/ACT nasal spray Commonly known as: FLONASE  2 sprays each nostril 1 time per day   ibuprofen  800 MG tablet Commonly known as: ADVIL  TAKE 1 TABLET EVERY 8 HOURS FOR 10 DAYS. PLEASE TAKE WITH FOOD, PLEASE ALTERNATE WITH ACETAMINOPHEN    IRON PO Take 25 mg by mouth daily.   magnesium gluconate 500 MG tablet Commonly known as: MAGONATE Take 500 mg by mouth 2 (two) times daily.   METAMUCIL FIBER PO Take 4 capsules by mouth 2 (two) times daily.   methocarbamol  500 MG tablet Commonly known as: ROBAXIN  Take 1 tablet (500 mg total) by mouth 4 (four) times daily.   mometasone  0.1 % ointment Commonly known as: ELOCON  Apply twice daily for poison ivy flare up, maximum use 14 days.   Omega-3 1000 MG Caps Take 2,000 mg by  mouth daily.   omeprazole  40 MG capsule Commonly known as: PRILOSEC TAKE 1 CAPSULE BY MOUTH IN THE MORNING   Praluent  150 MG/ML Soaj Generic drug: Alirocumab  INJECT 1 ML (150 MG TOTAL) INTO THE SKIN EVERY 14 (FOURTEEN) DAYS.   PROBIOTIC PO Take 1 capsule by mouth daily.   SYSTANE OP Place 1 drop into both eyes daily as needed (irritation).   THERATEARS OP Place 1 drop into both eyes daily as needed (irritation).   vitamin B-12 500 MCG tablet Commonly known as: CYANOCOBALAMIN  Take 500 mcg by mouth daily.   Zepbound  2.5 MG/0.5ML Pen Generic drug: tirzepatide  Inject 2.5 mg into the skin once a week.   zolpidem 10 MG  tablet Commonly known as: AMBIEN Take 10 mg by mouth at bedtime.      Past Medical History:  Diagnosis Date   Allergy    Anemia    Benign colon polyp 04/07/2017   Q 5 year recall   Blood transfusion without reported diagnosis    Chronic neck pain - post injury, worker's comp 2013, 2018 04/07/2017   Dr. Josefina   Clostridium difficile infection    Glaucoma    History of peptic ulcer 04/07/2017   Hyperlipidemia    IBS (irritable bowel syndrome) 04/07/2017   Colonoscopy by [facility]   Migraines    PTSD (post-traumatic stress disorder) 04/07/2017   Ulcer    age 89    Past Surgical History:  Procedure Laterality Date   CHOLECYSTECTOMY  2012   COLONOSCOPY  2014   DG GALL BLADDER     2012   ESOPHAGOGASTRODUODENOSCOPY  2014   FLEXIBLE SIGMOIDOSCOPY  03/2014   OVARIAN CYST REMOVAL     right shoulder surgery     SHOULDER ARTHROSCOPY WITH ROTATOR CUFF REPAIR AND OPEN BICEPS TENODESIS Left 11/22/2021   Procedure: LEFT SHOULDER ARTHROSCOPY WITH ROTATOR CUFF REPAIR AND BICEPS TENODESIS;  Surgeon: Genelle Standing, MD;  Location: [facility] OR;  Service: Orthopedics;  Laterality: Left;   TMJ ARTHROSCOPY      Review of systems negative except as noted in HPI / PMHx or noted below:  Review of Systems  Constitutional: Negative.   HENT: Negative.    Eyes: Negative.   Respiratory: Negative.    Cardiovascular: Negative.   Gastrointestinal: Negative.   Genitourinary: Negative.   Musculoskeletal: Negative.   Skin: Negative.   Neurological: Negative.   Endo/Heme/Allergies: Negative.   Psychiatric/Behavioral: Negative.       Objective:   Vitals:   01/09/24 0945  BP: 116/88  Pulse: 93  Temp: (!) 97.3 F (36.3 C)  SpO2: 95%   Height: 5' 6 (167.6 cm)  Weight: 183 lb (83 kg)   Physical Exam Constitutional:      Appearance: She is not diaphoretic.     Comments: Cough   HENT:     Head: Normocephalic.     Right Ear: Tympanic membrane, ear canal and external ear normal.     Left  Ear: Tympanic membrane, ear canal and external ear normal.     Nose: Nose normal. No mucosal edema (Septal perforation with clear borders) or rhinorrhea.     Mouth/Throat:     Pharynx: Uvula midline. No oropharyngeal exudate.  Eyes:     Conjunctiva/sclera: Conjunctivae normal.  Neck:     Thyroid : No thyromegaly.     Trachea: Trachea normal. No tracheal tenderness or tracheal deviation.  Cardiovascular:     Rate and Rhythm: Normal rate and regular rhythm.     Heart sounds: Normal  heart sounds, S1 normal and S2 normal. No murmur heard. Pulmonary:     Effort: No respiratory distress.     Breath sounds: Normal breath sounds. No stridor. No wheezing or rales.  Lymphadenopathy:     Head:     Right side of head: No tonsillar adenopathy.     Left side of head: No tonsillar adenopathy.     Cervical: No cervical adenopathy.  Skin:    Findings: No erythema or rash.     Nails: There is no clubbing.  Neurological:     Mental Status: She is alert.     Diagnostics: Spirometry was performed and demonstrated an FEV1 of 2.10 at 84 % of predicted.  Assessment and Plan:   1. Respiratory tract infection   2. LPRD (laryngopharyngeal reflux disease)   3. Perennial allergic rhinitis   4. Nasal septal perforation   5. Dry eye syndrome of both eyes    1.  Treat and prevent reflux:   A. Omeprazole  40 mg - 1 tablet in AM B. Famotidine  40 mg -1 tablet in PM  2.  Treat and prevent inflammation of airway:  A. Nasal fluticasone  - 2 sprays each nostril 1 time per day  3.  For this recent event:   A. Depomedrol 80 mg IM delivered in clinic today   B. Tussionex - 2.5 mls every 12 hours if needed for cough, NARCOTIC, 60 ml  C. Nasal saline  D. Mucinex DM  4.  Influenza = Tamiflu, Covid = Paxlovid  5. Return to clinic in 6 months or earlier if problem  Vara probably had COVID at the tail end of December and I think she is suffering from the significant inflammatory event that occurred as a  result of that infection and we will give her a systemic steroid with Depo-Medrol  and I have also given her some Tussionex with a warning that this is a narcotic and should only be used as needed and we have limited the dose to just 60 mL.  She will remain on therapy for her reflux as noted above.  Her upper airway disease appears to be pretty good and hopefully in another few weeks she will get her sense of smell back.  She will keep in contact with me noting her response to this approach and I will see her back in this clinic in 6 months or earlier if there is a problem.  Camellia Denis, MD Allergy / Immunology [facility] "

## 2024-01-09 NOTE — Telephone Encounter (Signed)
 Called and spoke with the patient and advised that per Dr. Rowan AVS if she were to get the Flu she would need to take Tamiflu, and if she were to get COVID she can take Paxlovid. I advised that at this time it was not required for her to take Tamiflu since she does not have the Flu. Patient verbalized understanding.

## 2024-01-09 NOTE — Telephone Encounter (Signed)
 Called Pt and asked her to come back to office to pick up the paper for Tussionex. She said she will come back after 1:15 pm.

## 2024-01-09 NOTE — Patient Instructions (Addendum)
" °  1.  Treat and prevent reflux:   A. Omeprazole  40 mg - 1 tablet in AM B. Famotidine  40 mg -1 tablet in PM  2.  Treat and prevent inflammation of airway:  A. Nasal fluticasone  - 2 sprays each nostril 1 time per day  3.  For this recent event:   A. Depomedrol 80 mg IM delivered in clinic today   B. Tussionex - 2.5 mls every 12 hours if needed for cough, NARCOTIC, 60 ml  C. Nasal saline  D. Mucinex DM  4.  Influenza = Tamiflu, Covid = Paxlovid  5. Return to clinic in 6 months or earlier if problem "

## 2024-01-09 NOTE — Telephone Encounter (Signed)
 Isis called and stated that she was to have been prescribed Tamiflu this morning at her appointment, and that [facility] has told her that it was not received. She is asking for a call back when it has been re sent in.

## 2024-01-10 ENCOUNTER — Encounter: Payer: Self-pay | Admitting: Allergy and Immunology

## 2024-01-11 ENCOUNTER — Other Ambulatory Visit: Payer: Self-pay | Admitting: *Deleted

## 2024-01-11 ENCOUNTER — Telehealth: Payer: Self-pay | Admitting: Allergy and Immunology

## 2024-01-11 MED ORDER — BENZONATATE 100 MG PO CAPS: 100.0000 mg | ORAL_CAPSULE | Freq: Two times a day (BID) | ORAL | 0 refills | Status: DC | PRN

## 2024-01-11 NOTE — Telephone Encounter (Signed)
 Is it okay to send this in for the patient?

## 2024-01-11 NOTE — Telephone Encounter (Signed)
Called patient and informed. Patient verbalized understanding.  

## 2024-01-11 NOTE — Telephone Encounter (Signed)
 Pt called and stated that she cannot take the Hydrocodone,  it did not help her, and made her blood pressure go up. She states that she needs benzonatate  (TESSALON ) 100 MG capsule [[id number]] as that is the only thing that has helped her, and she would like it sent in to the [facility] [phone] [facility]

## 2024-01-11 NOTE — Telephone Encounter (Signed)
Prescription has been sent in. Called patient and left a voicemail asking to return call to inform.

## 2024-01-18 ENCOUNTER — Encounter: Payer: Self-pay | Admitting: Internal Medicine

## 2024-01-31 ENCOUNTER — Telehealth: Payer: Self-pay

## 2024-02-05 ENCOUNTER — Ambulatory Visit

## 2024-02-05 VITALS — Ht 66.0 in | Wt 171.0 lb

## 2024-02-05 DIAGNOSIS — Z860100 Personal history of colon polyps, unspecified: Secondary | ICD-10-CM

## 2024-02-05 MED ORDER — NA SULFATE-K SULFATE-MG SULF 17.5-3.13-1.6 GM/177ML PO SOLN: 1.0000 | Freq: Once | ORAL | 0 refills | Status: AC

## 2024-02-05 NOTE — Progress Notes (Signed)
 RN confirmed patient name, date of birth, and address RN confirmed date and time of procedure RN reviewed and confirmed allergies  RN reviewed and updated current medications; confirmed preferred pharmacy Pt is not on diet pills nor GLP-1 medications Pt is not on blood thinners RN reviewed medical & surgical hx  Pt states she has issues with chronic constipation; Metamucil & magnesium help No A fib or A flutter  No cardiac tests are pending  Pt is not on home 02  No issues known with past sedation with any surgeries or procedures Patient denies ever being told they had issues or difficulty with intubation  Patient unaware of any fh of malignant hyperthermia Ambulates independently RN reviewed prep instructions and explained time frames for holding certain medications RN answered patient questions; patient stated understanding

## 2024-02-08 ENCOUNTER — Encounter: Payer: Self-pay | Admitting: Internal Medicine

## 2024-02-19 ENCOUNTER — Encounter: Admitting: Internal Medicine

## 2024-02-21 ENCOUNTER — Encounter: Admitting: Internal Medicine

## 2024-07-02 ENCOUNTER — Ambulatory Visit: Admitting: Allergy and Immunology
